# Patient Record
Sex: Male | Born: 1960
Health system: Southern US, Community
[De-identification: ages and names within clinical notes are randomized; demographics above are authoritative.]

## PROBLEM LIST (undated history)

## (undated) DIAGNOSIS — C4491 Basal cell carcinoma of skin, unspecified: Secondary | ICD-10-CM

## (undated) DIAGNOSIS — M1990 Unspecified osteoarthritis, unspecified site: Secondary | ICD-10-CM

## (undated) DIAGNOSIS — I10 Essential (primary) hypertension: Secondary | ICD-10-CM

## (undated) DIAGNOSIS — G4730 Sleep apnea, unspecified: Secondary | ICD-10-CM

## (undated) DIAGNOSIS — C801 Malignant (primary) neoplasm, unspecified: Secondary | ICD-10-CM

## (undated) DIAGNOSIS — F419 Anxiety disorder, unspecified: Secondary | ICD-10-CM

## (undated) DIAGNOSIS — J189 Pneumonia, unspecified organism: Secondary | ICD-10-CM

## (undated) DIAGNOSIS — K219 Gastro-esophageal reflux disease without esophagitis: Secondary | ICD-10-CM

## (undated) HISTORY — PX: TONSILLECTOMY: SUR1361

## (undated) HISTORY — PX: BACK SURGERY: SHX140

## (undated) HISTORY — PX: COLONOSCOPY: SHX174

## (undated) HISTORY — DX: Basal cell carcinoma of skin, unspecified: C44.91

---

## 2013-06-22 LAB — CBC AND DIFFERENTIAL
HCT: 40 % — AB (ref 41–53)
HEMOGLOBIN: 13.8 g/dL (ref 13.5–17.5)
Neutrophils Absolute: 3 /uL
Platelets: 251 10*3/uL (ref 150–399)
WBC: 6.1 10*3/mL

## 2013-06-22 LAB — BASIC METABOLIC PANEL
BUN: 13 mg/dL (ref 4–21)
CREATININE: 0.7 mg/dL (ref 0.6–1.3)
Glucose: 89 mg/dL
POTASSIUM: 4.7 mmol/L (ref 3.4–5.3)
Sodium: 141 mmol/L (ref 137–147)

## 2013-06-22 LAB — LIPID PANEL
Cholesterol: 231 mg/dL — AB (ref 0–200)
HDL: 56 mg/dL (ref 35–70)
LDL CALC: 156 mg/dL
LDl/HDL Ratio: 2.8
TRIGLYCERIDES: 95 mg/dL (ref 40–160)

## 2013-06-22 LAB — HEPATIC FUNCTION PANEL
ALK PHOS: 77 U/L (ref 25–125)
ALT: 19 U/L (ref 10–40)
AST: 16 U/L (ref 14–40)

## 2013-06-22 LAB — TSH: TSH: 0.91 u[IU]/mL (ref 0.41–5.90)

## 2013-06-22 LAB — PSA: PSA: 0.2

## 2014-10-06 ENCOUNTER — Other Ambulatory Visit: Payer: Self-pay | Admitting: Family Medicine

## 2015-01-02 DIAGNOSIS — Z6841 Body Mass Index (BMI) 40.0 and over, adult: Secondary | ICD-10-CM | POA: Insufficient documentation

## 2015-01-02 DIAGNOSIS — I10 Essential (primary) hypertension: Secondary | ICD-10-CM | POA: Insufficient documentation

## 2015-01-02 DIAGNOSIS — E559 Vitamin D deficiency, unspecified: Secondary | ICD-10-CM | POA: Insufficient documentation

## 2015-01-02 DIAGNOSIS — M1990 Unspecified osteoarthritis, unspecified site: Secondary | ICD-10-CM | POA: Insufficient documentation

## 2015-01-02 DIAGNOSIS — G4730 Sleep apnea, unspecified: Secondary | ICD-10-CM | POA: Insufficient documentation

## 2015-01-02 DIAGNOSIS — E669 Obesity, unspecified: Secondary | ICD-10-CM | POA: Insufficient documentation

## 2015-01-08 ENCOUNTER — Ambulatory Visit (INDEPENDENT_AMBULATORY_CARE_PROVIDER_SITE_OTHER): Payer: BC Managed Care – PPO | Admitting: Family Medicine

## 2015-01-08 ENCOUNTER — Encounter: Payer: Self-pay | Admitting: Family Medicine

## 2015-01-08 VITALS — BP 122/80 | HR 74 | Temp 98.6°F | Resp 16 | Ht 71.0 in | Wt 369.0 lb

## 2015-01-08 DIAGNOSIS — Z125 Encounter for screening for malignant neoplasm of prostate: Secondary | ICD-10-CM

## 2015-01-08 DIAGNOSIS — Z1211 Encounter for screening for malignant neoplasm of colon: Secondary | ICD-10-CM | POA: Diagnosis not present

## 2015-01-08 DIAGNOSIS — Z0000 Encounter for general adult medical examination without abnormal findings: Secondary | ICD-10-CM | POA: Diagnosis not present

## 2015-01-08 LAB — POCT URINALYSIS DIPSTICK
BILIRUBIN UA: NEGATIVE
GLUCOSE UA: NEGATIVE
Ketones, UA: NEGATIVE
Leukocytes, UA: NEGATIVE
Nitrite, UA: NEGATIVE
PH UA: 7
PROTEIN UA: NEGATIVE
RBC UA: NEGATIVE
SPEC GRAV UA: 1.01
Urobilinogen, UA: 0.2

## 2015-01-08 NOTE — Progress Notes (Signed)
Patient ID: Terry Horn, male   DOB: 01-10-1961, 54 y.o.   MRN: [id number]       Patient: Terry Horn, Male    DOB: 1961/01/13, 54 y.o.   MRN: [id number] Visit Date: 01/08/2015  Today's Provider: Wilhemena Durie, MD   Chief Complaint  Patient presents with  . Annual Exam   Subjective:    Annual physical exam Terry Horn is a 54 y.o. male who presents today for health maintenance and complete physical. He feels well. He reports exercising 3 times a week. Cardio and weight lifting. He reports he is sleeping well.  ----------------------------------------------------------------- EKG- 04/16/11 Pneumovax 23- 12/21/91 Tdap- 10/26/07 Colonoscopy- Never  Review of Systems  Constitutional: Negative.   HENT: Positive for congestion.   Eyes: Negative.   Respiratory: Negative.   Cardiovascular: Negative.   Gastrointestinal: Negative.   Endocrine: Negative.   Genitourinary: Negative.   Musculoskeletal: Negative.   Skin: Negative.   Allergic/Immunologic: Negative.   Neurological: Negative.   Hematological: Negative.     Social History He  reports that he has quit smoking. He does not have any smokeless tobacco history on file. He reports that he drinks alcohol. He reports that he does not use illicit drugs. Social History   Social History  . Marital Status: Married    Spouse Name: N/A  . Number of Children: N/A  . Years of Education: N/A   Social History Main Topics  . Smoking status: Former Smoker -- 22 years  . Smokeless tobacco: None     Comment: Quit in 1998  . Alcohol Use: Yes     Comment: occasionally  . Drug Use: No  . Sexual Activity: Not Asked   Other Topics Concern  . None   Social History Narrative    Patient Active Problem List   Diagnosis Date Noted  . Arthritis 01/02/2015  . Body mass index (BMI) of 50-59.9 in adult ([place]) 01/02/2015  . Essential (primary) hypertension 01/02/2015  . Adiposity 01/02/2015  . Apnea, sleep 01/02/2015    . Avitaminosis D 01/02/2015    Past Surgical History  Procedure Laterality Date  . Tonsillectomy      Family History  Family Status  Relation Status Death Age  . Mother Alive   . Father Alive   . Sister Alive   . Son Alive   . Paternal Grandfather Deceased   . Son Alive    His family history includes Bipolar disorder in his mother; Depression in his sister; Prostate cancer in his paternal grandfather and paternal uncle.    No Known Allergies  Previous Medications   ASPIRIN 81 MG TABLET    Take by mouth.   CETIRIZINE-PSEUDOEPHEDRINE (ZYRTEC-D ALLERGY & CONGESTION) 5-120 MG TABLET    Take by mouth.   CHOLECALCIFEROL 1000 UNITS TABLET    Take by mouth.   LOSARTAN-HYDROCHLOROTHIAZIDE (HYZAAR) 100-12.5 MG PER TABLET    TAKE 1 TABLET BY MOUTH DAILY   MISC NATURAL PRODUCTS (GLUCOSAMINE CHONDROITIN COMPLX) TABS    Take by mouth.   MULTIPLE VITAMIN TABLET    Take by mouth.   NABUMETONE (RELAFEN) 500 MG TABLET    Take by mouth.   OMEGA-3 FATTY ACIDS PO    Take by mouth.   TURMERIC, CURCUMA LONGA, POWD       VITAMIN C (ASCORBIC ACID) 500 MG TABLET    Take by mouth.   VITAMIN E 400 UNIT CAPSULE    Take by mouth.    Patient Care Team: Delfino Lovett  L Cranford Mon., MD as PCP - General (Family Medicine)     Objective:   Vitals: BP 122/80 mmHg  Pulse 74  Temp(Src) 98.6 F (37 C) (Oral)  Resp 16  Ht 5\' 11"  (1.803 m)  Wt 369 lb (167.377 kg)  BMI 51.49 kg/m2   Physical Exam  Constitutional: He is oriented to person, place, and time. He appears well-developed and well-nourished.  HENT:  Head: Normocephalic and atraumatic.  Right Ear: External ear normal.  Left Ear: External ear normal.  Nose: Nose normal.  Mouth/Throat: Oropharynx is clear and moist.  Eyes: Conjunctivae and EOM are normal. Pupils are equal, round, and reactive to light.  Neck: Normal range of motion. Neck supple.  Cardiovascular: Normal rate, regular rhythm, normal heart sounds and intact distal pulses.    Pulmonary/Chest: Effort normal and breath sounds normal.  Abdominal: Soft. Bowel sounds are normal.  Genitourinary: Rectum normal, prostate normal and penis normal.  Large perianal skin tag present.  Musculoskeletal: Normal range of motion. He exhibits edema.  Trace edema, mainly lymphedema.  Neurological: He is alert and oriented to person, place, and time. He has normal reflexes.  Skin: Skin is warm and dry.  Psychiatric: He has a normal mood and affect. His behavior is normal. Judgment and thought content normal.     Depression Screen PHQ 2/9 Scores 01/08/2015  PHQ - 2 Score 0      Assessment & Plan:     Routine Health Maintenance and Physical Exam  Exercise Activities and Dietary recommendations Goals    None      Immunization History  Administered Date(s) Administered  . Pneumococcal Polysaccharide-23 12/21/1991  . Tdap 10/26/2007    Health Maintenance  Topic Date Due  . Hepatitis C Screening  1960-05-25  . HIV Screening  09/14/1975  . COLONOSCOPY  09/14/2010  . INFLUENZA VACCINE  01/08/2016 (Originally 10/01/2014)  . TETANUS/TDAP  10/25/2017     Refer for screening colonoscopy Discussed health benefits of physical activity, and encouraged him to engage in regular exercise appropriate for his age and condition.   Morbid obesity Diet and exercise discussed. Seborrheic keratosis of right forearm Refer to dermatology Large Peri Anal skin tag Refer to Dr. Bary Castilla --------------------------------------------------------------------

## 2015-01-09 LAB — CBC WITH DIFFERENTIAL/PLATELET
Basophils Absolute: 0 10*3/uL (ref 0.0–0.2)
Basos: 1 %
EOS (ABSOLUTE): 0.1 10*3/uL (ref 0.0–0.4)
EOS: 2 %
HEMATOCRIT: 40.3 % (ref 37.5–51.0)
HEMOGLOBIN: 13.6 g/dL (ref 12.6–17.7)
Immature Grans (Abs): 0 10*3/uL (ref 0.0–0.1)
Immature Granulocytes: 1 %
LYMPHS ABS: 2.2 10*3/uL (ref 0.7–3.1)
Lymphs: 35 %
MCH: 27.8 pg (ref 26.6–33.0)
MCHC: 33.7 g/dL (ref 31.5–35.7)
MCV: 82 fL (ref 79–97)
MONOCYTES: 8 %
Monocytes Absolute: 0.5 10*3/uL (ref 0.1–0.9)
NEUTROS ABS: 3.4 10*3/uL (ref 1.4–7.0)
Neutrophils: 53 %
Platelets: 290 10*3/uL (ref 150–379)
RBC: 4.9 x10E6/uL (ref 4.14–5.80)
RDW: 13.6 % (ref 12.3–15.4)
WBC: 6.2 10*3/uL (ref 3.4–10.8)

## 2015-01-09 LAB — COMPREHENSIVE METABOLIC PANEL
ALBUMIN: 4.7 g/dL (ref 3.5–5.5)
ALK PHOS: 81 IU/L (ref 39–117)
ALT: 23 IU/L (ref 0–44)
AST: 24 IU/L (ref 0–40)
Albumin/Globulin Ratio: 2 (ref 1.1–2.5)
BILIRUBIN TOTAL: 0.4 mg/dL (ref 0.0–1.2)
BUN / CREAT RATIO: 18 (ref 9–20)
BUN: 12 mg/dL (ref 6–24)
CHLORIDE: 96 mmol/L — AB (ref 97–106)
CO2: 26 mmol/L (ref 18–29)
Calcium: 9.5 mg/dL (ref 8.7–10.2)
Creatinine, Ser: 0.67 mg/dL — ABNORMAL LOW (ref 0.76–1.27)
GFR calc Af Amer: 126 mL/min/{1.73_m2} (ref >59)
GFR calc non Af Amer: 109 mL/min/{1.73_m2} (ref >59)
GLUCOSE: 95 mg/dL (ref 65–99)
Globulin, Total: 2.3 g/dL (ref 1.5–4.5)
Potassium: 4.1 mmol/L (ref 3.5–5.2)
Sodium: 139 mmol/L (ref 136–144)
Total Protein: 7 g/dL (ref 6.0–8.5)

## 2015-01-09 LAB — LIPID PANEL WITH LDL/HDL RATIO
Cholesterol, Total: 218 mg/dL — ABNORMAL HIGH (ref 100–199)
HDL: 51 mg/dL (ref >39)
LDL CALC: 141 mg/dL — AB (ref 0–99)
LDL/HDL RATIO: 2.8 ratio (ref 0.0–3.6)
Triglycerides: 128 mg/dL (ref 0–149)
VLDL Cholesterol Cal: 26 mg/dL (ref 5–40)

## 2015-01-09 LAB — PSA: PROSTATE SPECIFIC AG, SERUM: 0.2 ng/mL (ref 0.0–4.0)

## 2015-01-09 LAB — TSH: TSH: 0.88 u[IU]/mL (ref 0.450–4.500)

## 2015-01-10 ENCOUNTER — Telehealth: Payer: Self-pay

## 2015-01-10 NOTE — Telephone Encounter (Signed)
Left message to call back  

## 2015-01-10 NOTE — Telephone Encounter (Signed)
-----   Message from Jerrol Banana., MD sent at 01/09/2015  2:14 PM EST ----- Labs in normal range.

## 2015-01-17 NOTE — Telephone Encounter (Signed)
Pt advised-aa 

## 2015-04-22 ENCOUNTER — Telehealth: Payer: Self-pay | Admitting: Family Medicine

## 2015-04-22 MED ORDER — OSELTAMIVIR PHOSPHATE 75 MG PO CAPS: 75.0000 mg | ORAL_CAPSULE | Freq: Every day | ORAL | Status: DC

## 2015-04-22 NOTE — Telephone Encounter (Signed)
Wife with flu. Will send in prophylaxis.

## 2015-05-01 ENCOUNTER — Telehealth: Payer: Self-pay | Admitting: Family Medicine

## 2015-05-01 DIAGNOSIS — R0683 Snoring: Secondary | ICD-10-CM

## 2015-05-01 DIAGNOSIS — R0681 Apnea, not elsewhere classified: Secondary | ICD-10-CM

## 2015-05-01 NOTE — Telephone Encounter (Signed)
Pt is requesting that a sleep study be set up at [facility].He states that he has spoken with you concerning this outside of the office

## 2015-05-01 NOTE — Telephone Encounter (Signed)
Order put in-aa 

## 2015-05-01 NOTE — Telephone Encounter (Signed)
Please review-aa 

## 2015-05-01 NOTE — Telephone Encounter (Signed)
Overnight sleep study for probable sleep apnea.patient snores and has witnessed apnea.

## 2015-05-31 ENCOUNTER — Ambulatory Visit: Payer: BC Managed Care – PPO | Attending: Otolaryngology

## 2015-05-31 DIAGNOSIS — R0683 Snoring: Secondary | ICD-10-CM | POA: Diagnosis present

## 2015-05-31 DIAGNOSIS — G4733 Obstructive sleep apnea (adult) (pediatric): Secondary | ICD-10-CM | POA: Diagnosis not present

## 2015-06-25 ENCOUNTER — Telehealth: Payer: Self-pay | Admitting: Family Medicine

## 2015-06-25 DIAGNOSIS — G4730 Sleep apnea, unspecified: Secondary | ICD-10-CM

## 2015-06-25 NOTE — Telephone Encounter (Signed)
Patient has severe sleep apnea. Start CPAP at 13 cm water nightly and follow-up here in office 1 month later. I don't know anything about an MRI.

## 2015-06-25 NOTE — Telephone Encounter (Signed)
Please review sleep study results and i do not see that we received an email from [place]

## 2015-06-25 NOTE — Telephone Encounter (Signed)
Pt said he had a sleep study 3/31 and has not heard anything back.  Also he said he emailed [place] regarding sedation MRI  Back and hasn't heard back.    Please advise  [phone]  Thanks Con Memos

## 2015-06-26 NOTE — Telephone Encounter (Signed)
LMTCB

## 2015-06-26 NOTE — Telephone Encounter (Signed)
Yes

## 2015-06-26 NOTE — Telephone Encounter (Signed)
Order for CPAP faxed to [facility]

## 2015-06-26 NOTE — Telephone Encounter (Signed)
Advised patient as below.  

## 2015-06-26 NOTE — Telephone Encounter (Signed)
Patient advised as directed below. CPAP ordered. CB# [phone]  Patient states he had sent a personal e mail to Dr. Rosanna Randy regarding a MRI. Patient states Dr. Drema Dallas is recommending a MRI on back, and want to know if Dr. Rosanna Randy thinks patient is stable to be put to sleep for the MRI. If Dr. Rosanna Randy thinks patient is stable enough Dr. Drema Dallas will order MRI. Patient's CB # [phone].

## 2015-06-26 NOTE — Telephone Encounter (Signed)
Left message to call back  

## 2015-06-28 ENCOUNTER — Other Ambulatory Visit (HOSPITAL_COMMUNITY): Payer: Self-pay | Admitting: Sports Medicine

## 2015-07-02 ENCOUNTER — Other Ambulatory Visit: Payer: Self-pay | Admitting: Family Medicine

## 2015-07-26 ENCOUNTER — Ambulatory Visit (INDEPENDENT_AMBULATORY_CARE_PROVIDER_SITE_OTHER): Payer: BC Managed Care – PPO | Admitting: Family Medicine

## 2015-07-26 ENCOUNTER — Encounter: Payer: Self-pay | Admitting: Family Medicine

## 2015-07-26 VITALS — BP 144/86 | HR 76 | Temp 98.2°F | Resp 16 | Wt 373.0 lb

## 2015-07-26 DIAGNOSIS — R202 Paresthesia of skin: Secondary | ICD-10-CM | POA: Insufficient documentation

## 2015-07-26 DIAGNOSIS — G4730 Sleep apnea, unspecified: Secondary | ICD-10-CM | POA: Diagnosis not present

## 2015-07-26 DIAGNOSIS — R0789 Other chest pain: Secondary | ICD-10-CM | POA: Insufficient documentation

## 2015-07-26 DIAGNOSIS — Z6841 Body Mass Index (BMI) 40.0 and over, adult: Secondary | ICD-10-CM

## 2015-07-26 DIAGNOSIS — E669 Obesity, unspecified: Secondary | ICD-10-CM | POA: Diagnosis not present

## 2015-07-26 LAB — POCT GLYCOSYLATED HEMOGLOBIN (HGB A1C)
ESTIMATED AVERAGE GLUCOSE: 123
HEMOGLOBIN A1C: 5.9

## 2015-07-26 NOTE — Progress Notes (Signed)
Subjective:    Patient ID: Terry Horn, male    DOB: [birth year], 55 y.o.   MRN: [id number]  HPI  Sleep Apnea: He presents for a sleep evaluation. He complains of decreased memory, excessive daytime sleepiness, congested nose.  Symptoms began a few years ago, gradually worsening since that time. Collar size 18. Previous evaluation and treatment has included sleep study on 06/10/2015, which showed obstructive sleep apnea, pt currently on CPAP that uses 13 cm H2O, heated and humidified. Pt has been using the CPAP since the beginning of May. Pt is concerned because he is still experiencing excessive fatigue (was fearful of falling asleep while driving 3 hours to [place]), a chest pressure (pt believes this to be from the CPAP, but is still concerned of cardiac involvement). Pt would like to discuss weight loss as well as the seriousness of sleep apnea. Pt states he is "anxious" about having to use the CPAP machine.   Review of Systems  Constitutional: Positive for fever, chills, diaphoresis and fatigue. Negative for activity change, appetite change and unexpected weight change.  HENT: Positive for congestion.   Respiratory: Positive for apnea and cough. Negative for shortness of breath.   Cardiovascular: Positive for chest pain ("bloating"). Negative for palpitations and leg swelling.  Endocrine: Negative for cold intolerance, heat intolerance, polydipsia, polyphagia and polyuria.  Neurological:       Paresthesias are present  Psychiatric/Behavioral: The patient is nervous/anxious.    BP 144/86 mmHg  Pulse 76  Temp(Src) 98.2 F (36.8 C) (Oral)  Resp 16  Wt 373 lb (169.192 kg)   Patient Active Problem List   Diagnosis Date Noted  . Arthritis 01/02/2015  . Body mass index (BMI) of 50-59.9 in adult ([place]) 01/02/2015  . Essential (primary) hypertension 01/02/2015  . Adiposity 01/02/2015  . Apnea, sleep 01/02/2015  . Avitaminosis D 01/02/2015   No past medical history on  file. Current Outpatient Prescriptions on File Prior to Visit  Medication Sig  . aspirin 81 MG tablet Take by mouth.  . cetirizine-pseudoephedrine (ZYRTEC-D ALLERGY & CONGESTION) 5-120 MG tablet Take by mouth.  . Cholecalciferol 1000 UNITS tablet Take by mouth.  . losartan-hydrochlorothiazide (HYZAAR) 100-12.5 MG per tablet TAKE 1 TABLET BY MOUTH DAILY  . Misc Natural Products (GLUCOSAMINE CHONDROITIN COMPLX) TABS Take by mouth.  . Multiple Vitamin tablet Take by mouth.  . nabumetone (RELAFEN) 500 MG tablet TAKE 1 TABLET TWICE DAILY AS NEEDED FOR PAIN  . OMEGA-3 FATTY ACIDS PO Take by mouth.  . Turmeric, Curcuma [place], POWD   . vitamin C (ASCORBIC ACID) 500 MG tablet Take by mouth.  . vitamin E 400 UNIT capsule Take by mouth.   No current facility-administered medications on file prior to visit.   No Known Allergies Past Surgical History  Procedure Laterality Date  . Tonsillectomy     Social History   Social History  . Marital Status: Married    Spouse Name: N/A  . Number of Children: N/A  . Years of Education: N/A   Occupational History  . Not on file.   Social History Main Topics  . Smoking status: Former Smoker -- 22 years  . Smokeless tobacco: Not on file     Comment: Quit in 1998  . Alcohol Use: Yes     Comment: occasionally  . Drug Use: No  . Sexual Activity: Not on file   Other Topics Concern  . Not on file   Social History Narrative   Family History  Problem  Relation Age of Onset  . Bipolar disorder Mother   . Depression Sister   . Prostate cancer Paternal Uncle   . Prostate cancer Paternal Grandfather       Objective:   Physical Exam  Constitutional: He is oriented to person, place, and time. He appears well-developed and well-nourished.  Cardiovascular: Normal rate and regular rhythm.   Pulmonary/Chest: Effort normal and breath sounds normal.  Neurological: He is alert and oriented to person, place, and time.  Psychiatric: He has a normal mood  and affect. His behavior is normal. Judgment and thought content normal.  BP 144/86 mmHg  Pulse 76  Temp(Src) 98.2 F (36.8 C) (Oral)  Resp 16  Wt 373 lb (169.192 kg)      Assessment & Plan:  1. Paresthesia Concerned about diabetes, but blood sugar is only slightly elevated at this time.   - POCT glycosylated hemoglobin (Hb A1C) Results for orders placed or performed in visit on 07/26/15  POCT glycosylated hemoglobin (Hb A1C)  Result Value Ref Range   Hemoglobin A1C 5.9    Est. average glucose Bld gHb Est-mCnc 123     2. Chest pressure EKG unchanged. Suspect had low blood sugar and anxiety but will refer to make sure not cardiac component. Also, ER if symptoms recur.  - EKG 12-Lead - Ambulatory referral to Cardiology  3. Apnea, sleep Will recheck CPAP as does not feel as well as he did after test.    - Cpap titration; Future  4. Adiposity Morbidly obese.   Recommend aggressive lifestyle changes and weight loss. May need to consider surgery.  - Amb Referral to Nutrition and Diabetic Education  5. Body mass index (BMI) of 50-59.9 in adult [facility])   Patient seen and examined by Jerrell Belfast, MD, and note scribed by Renaldo Fiddler, CMA.

## 2015-08-12 ENCOUNTER — Encounter: Payer: Self-pay | Admitting: *Deleted

## 2015-08-19 ENCOUNTER — Other Ambulatory Visit (HOSPITAL_COMMUNITY): Payer: Self-pay | Admitting: Sports Medicine

## 2015-08-19 DIAGNOSIS — S39012D Strain of muscle, fascia and tendon of lower back, subsequent encounter: Secondary | ICD-10-CM

## 2015-08-19 DIAGNOSIS — M5030 Other cervical disc degeneration, unspecified cervical region: Secondary | ICD-10-CM

## 2015-08-22 ENCOUNTER — Encounter: Payer: Self-pay | Admitting: Family Medicine

## 2015-08-22 ENCOUNTER — Ambulatory Visit (INDEPENDENT_AMBULATORY_CARE_PROVIDER_SITE_OTHER): Payer: BC Managed Care – PPO | Admitting: Family Medicine

## 2015-08-22 VITALS — BP 126/68 | HR 78 | Temp 98.0°F | Resp 16 | Wt 367.0 lb

## 2015-08-22 DIAGNOSIS — Z6841 Body Mass Index (BMI) 40.0 and over, adult: Secondary | ICD-10-CM | POA: Diagnosis not present

## 2015-08-22 DIAGNOSIS — E6609 Other obesity due to excess calories: Secondary | ICD-10-CM

## 2015-08-22 DIAGNOSIS — F419 Anxiety disorder, unspecified: Secondary | ICD-10-CM | POA: Diagnosis not present

## 2015-08-22 DIAGNOSIS — R202 Paresthesia of skin: Secondary | ICD-10-CM

## 2015-08-22 DIAGNOSIS — G4730 Sleep apnea, unspecified: Secondary | ICD-10-CM | POA: Diagnosis not present

## 2015-08-22 MED ORDER — ALPRAZOLAM 0.5 MG PO TABS: 0.5000 mg | ORAL_TABLET | Freq: Once | ORAL | Status: DC

## 2015-08-22 NOTE — Progress Notes (Signed)
Patient ID: Terry Horn, male   DOB: 09-18-1960, 55 y.o.   MRN: [id number]    Subjective:  HPI Pt is here to discuss his MRI for his back by [facility]. He needs clearance for sedation for this study. Pt is also wanting discussing bariatric surgery and his weight. Also sleep apnea concerns.   Prior to Admission medications   Medication Sig Start Date End Date Taking? Authorizing Provider  aspirin 81 MG tablet Take by mouth. 04/08/10  Yes Historical Provider, MD  cetirizine-pseudoephedrine (ZYRTEC-D ALLERGY & CONGESTION) 5-120 MG tablet Take by mouth. 10/07/10  Yes Historical Provider, MD  Cholecalciferol 1000 UNITS tablet Take by mouth. 04/08/10  Yes Historical Provider, MD  losartan-hydrochlorothiazide (HYZAAR) 100-12.5 MG per tablet TAKE 1 TABLET BY MOUTH DAILY 10/08/14  Yes Jerrol Banana., MD  Misc Natural Products (GLUCOSAMINE CHONDROITIN COMPLX) TABS Take by mouth.   Yes Historical Provider, MD  Multiple Vitamin tablet Take by mouth. 04/08/10  Yes Historical Provider, MD  nabumetone (RELAFEN) 500 MG tablet TAKE 1 TABLET TWICE DAILY AS NEEDED FOR PAIN 07/02/15  Yes Sydni Elizarraraz Maceo Pro., MD  OMEGA-3 FATTY ACIDS PO Take by mouth. 04/08/10  Yes Historical Provider, MD  Turmeric, Lear Ng, POWD    Yes Historical Provider, MD  vitamin C (ASCORBIC ACID) 500 MG tablet Take by mouth. 04/08/10  Yes Historical Provider, MD  vitamin E 400 UNIT capsule Take by mouth. 04/08/10  Yes Historical Provider, MD    Patient Active Problem List   Diagnosis Date Noted  . Chest pressure 07/26/2015  . Paresthesia 07/26/2015  . Arthritis 01/02/2015  . Body mass index (BMI) of 50-59.9 in adult ([place]) 01/02/2015  . Essential (primary) hypertension 01/02/2015  . Adiposity 01/02/2015  . Apnea, sleep 01/02/2015  . Avitaminosis D 01/02/2015    History reviewed. No pertinent past medical history.  Social History   Social History  . Marital Status: Married    Spouse Name: N/A  . Number of Children:  N/A  . Years of Education: N/A   Occupational History  . Not on file.   Social History Main Topics  . Smoking status: Former Smoker -- 22 years  . Smokeless tobacco: Not on file     Comment: Quit in 1998  . Alcohol Use: Yes     Comment: occasionally  . Drug Use: No  . Sexual Activity: Not on file   Other Topics Concern  . Not on file   Social History Narrative    No Known Allergies  Review of Systems  Constitutional: Negative.   HENT: Negative.   Eyes: Negative.   Cardiovascular: Negative.   Gastrointestinal: Negative.   Genitourinary: Negative.   Musculoskeletal: Positive for back pain.  Skin: Negative.   Neurological: Negative.   Endo/Heme/Allergies: Negative.   Psychiatric/Behavioral: The patient is nervous/anxious.     Immunization History  Administered Date(s) Administered  . Pneumococcal Polysaccharide-23 12/21/1991  . Tdap 10/26/2007   Objective:  BP 126/68 mmHg  Pulse 78  Temp(Src) 98 F (36.7 C) (Oral)  Resp 16  Wt 367 lb (166.47 kg)  SpO2 96%  Physical Exam  Constitutional: He is oriented to person, place, and time and well-developed, well-nourished, and in no distress.  HENT:  Head: Normocephalic and atraumatic.  Right Ear: External ear normal.  Left Ear: External ear normal.  Nose: Nose normal.  Mouth/Throat: Oropharynx is clear and moist.  Eyes: Conjunctivae and EOM are normal. Pupils are equal, round, and reactive to light.  Neck:  Normal range of motion. Neck supple.  Cardiovascular: Normal rate, regular rhythm, normal heart sounds and intact distal pulses.   Pulmonary/Chest: Effort normal and breath sounds normal.  Abdominal: Soft. Bowel sounds are normal.  Musculoskeletal: Normal range of motion.  Lymphedema bilaterally  Neurological: He is alert and oriented to person, place, and time. He has normal reflexes. Gait normal. GCS score is 15.  Skin: Skin is warm and dry.  Psychiatric: Mood, memory, affect and judgment normal.    Lab  Results  Component Value Date   WBC 6.2 01/08/2015   HGB 13.8 06/22/2013   HCT 40.3 01/08/2015   PLT 290 01/08/2015   GLUCOSE 95 01/08/2015   CHOL 218* 01/08/2015   TRIG 128 01/08/2015   HDL 51 01/08/2015   LDLCALC 141* 01/08/2015   TSH 0.880 01/08/2015   PSA 0.2 06/22/2013   HGBA1C 5.9 07/26/2015    CMP     Component Value Date/Time   NA 139 01/08/2015 0944   K 4.1 01/08/2015 0944   CL 96* 01/08/2015 0944   CO2 26 01/08/2015 0944   GLUCOSE 95 01/08/2015 0944   BUN 12 01/08/2015 0944   CREATININE 0.67* 01/08/2015 0944   CREATININE 0.7 06/22/2013   CALCIUM 9.5 01/08/2015 0944   PROT 7.0 01/08/2015 0944   ALBUMIN 4.7 01/08/2015 0944   AST 24 01/08/2015 0944   ALT 23 01/08/2015 0944   ALKPHOS 81 01/08/2015 0944   BILITOT 0.4 01/08/2015 0944   GFRNONAA 109 01/08/2015 0944   GFRAA 126 01/08/2015 0944    Assessment and Plan :  1. Body mass index (BMI) of 50-59.9 in adult [facility]) Discussed diet and exercise and options for bariatric surgery. Pt wants to try diet and exercise on his own first. He has lost 6 pounds since last OV a month ago.  Follow up in 2 months. He is certainly better off if he can lose weight on his own without the bariatric surgery. Initial goal will be to get below 300 pounds. We discussed regular exercise but even more importantly the need to change eating habits. 2. Sleep apnea Pt has been using Cpap nightly.   3. Paresthesia Pt scheduled for MRI with [facility]. Given Xanax for prior to procedure.   4. Acute anxiety Follow up in 2 months 5. Cardiac risk factors After review of his chart of think it is prudent to see cardiology just for risk assessment. They may  choose to do stress test or some other imaging and I think it would be appropriate if clinically indicated. More than 50% of this visit is spent in counseling regarding these multiple issues. I'll see him back in a couple months. Patient was seen and examined by Dr. Miguel Aschoff, and noted scribed by Webb Laws, [place] MD [facility] 08/22/2015 3:12 PM

## 2015-09-05 ENCOUNTER — Other Ambulatory Visit (HOSPITAL_COMMUNITY): Payer: Self-pay | Admitting: *Deleted

## 2015-09-05 NOTE — Pre-Procedure Instructions (Signed)
    Terry Horn  09/05/2015      Your procedure is scheduled on Tuesday, September 10, 2015 at 8:15 AM.   Report to [facility] Entrance "A" Admitting Office at 6:15 AM.   Call this number if you have problems the morning of surgery: [phone]   Any questions prior to day of surgery, please call [phone] between 8 & 4 PM.   Remember:  Do not eat food or drink liquids after midnight Monday, 09/09/15.  Stop Zyrtec D, Herbal Medications, Fish Oil Multivitamins and Vitamin E as of today.     Do not wear jewelry.  Do not wear lotions, powders, or cologne.  You may wear deoderant.  Men may shave face and neck.  Do not bring valuables to the hospital.  [facility] is not responsible for any belongings or valuables.  Contacts, dentures or bridgework may not be worn into surgery.  Leave your suitcase in the car.  After surgery it may be brought to your room.  For patients admitted to the hospital, discharge time will be determined by your treatment team.  Patients discharged the day of surgery will not be allowed to drive home.

## 2015-09-06 ENCOUNTER — Other Ambulatory Visit: Payer: Self-pay | Admitting: Emergency Medicine

## 2015-09-06 ENCOUNTER — Encounter (HOSPITAL_COMMUNITY): Payer: Self-pay

## 2015-09-06 ENCOUNTER — Encounter (HOSPITAL_COMMUNITY)
Admission: RE | Admit: 2015-09-06 | Discharge: 2015-09-06 | Disposition: A | Discharge status: Home / Self Care | Payer: BC Managed Care – PPO | Source: Ambulatory Visit | Attending: Family Medicine | Admitting: Family Medicine

## 2015-09-06 ENCOUNTER — Telehealth: Payer: Self-pay | Admitting: Emergency Medicine

## 2015-09-06 DIAGNOSIS — Z01818 Encounter for other preprocedural examination: Secondary | ICD-10-CM

## 2015-09-06 DIAGNOSIS — G4733 Obstructive sleep apnea (adult) (pediatric): Secondary | ICD-10-CM | POA: Insufficient documentation

## 2015-09-06 DIAGNOSIS — R200 Anesthesia of skin: Secondary | ICD-10-CM | POA: Insufficient documentation

## 2015-09-06 DIAGNOSIS — I10 Essential (primary) hypertension: Secondary | ICD-10-CM | POA: Insufficient documentation

## 2015-09-06 DIAGNOSIS — Z01812 Encounter for preprocedural laboratory examination: Secondary | ICD-10-CM | POA: Insufficient documentation

## 2015-09-06 DIAGNOSIS — Z6841 Body Mass Index (BMI) 40.0 and over, adult: Secondary | ICD-10-CM | POA: Insufficient documentation

## 2015-09-06 HISTORY — DX: Unspecified osteoarthritis, unspecified site: M19.90

## 2015-09-06 HISTORY — DX: Pneumonia, unspecified organism: J18.9

## 2015-09-06 HISTORY — DX: Malignant (primary) neoplasm, unspecified: C80.1

## 2015-09-06 HISTORY — DX: Essential (primary) hypertension: I10

## 2015-09-06 HISTORY — DX: Anxiety disorder, unspecified: F41.9

## 2015-09-06 HISTORY — DX: Sleep apnea, unspecified: G47.30

## 2015-09-06 HISTORY — DX: Gastro-esophageal reflux disease without esophagitis: K21.9

## 2015-09-06 LAB — BASIC METABOLIC PANEL
Anion gap: 7 (ref 5–15)
BUN: 11 mg/dL (ref 6–20)
CALCIUM: 9.5 mg/dL (ref 8.9–10.3)
CO2: 29 mmol/L (ref 22–32)
CREATININE: 0.72 mg/dL (ref 0.61–1.24)
Chloride: 102 mmol/L (ref 101–111)
GFR calc Af Amer: >60 mL/min (ref >60)
Glucose, Bld: 104 mg/dL — ABNORMAL HIGH (ref 65–99)
Potassium: 4.5 mmol/L (ref 3.5–5.1)
SODIUM: 138 mmol/L (ref 135–145)

## 2015-09-06 NOTE — Progress Notes (Signed)
Pt denies cardiac history. Does state that he has a lot of "bloating" and "gas pressure" in his chest with the use of his CPAP. Denies any chest or arm pain. States he gets up in the AM and sometimes drinks a diet coke to help him belch and then he feels better. He saw Dr. Rosanna Randy on 08/22/15 for clearance and there is note from Dr. Rosanna Randy stating a need for cardiology to see pt. Pt states he was not told this at the time of the appt and has "missed" some calls from a cardiology office. He states he texted Dr. Rosanna Randy and asked him about it and said that he still is not sure whether Dr. Rosanna Randy meant for him to have it prior to the MRI under sedation. I called Dr. Alben Spittle office and spoke with Chrys Racer. She states [place], one of the nurses, will contact the pt today and see about what happened to the order for the consult.

## 2015-09-06 NOTE — Telephone Encounter (Signed)
I spoke with pt. He had spoke with Dr. Rosanna Randy about this. Procedure will be postponed but will still need to get in ASAP. He has seen Dr. Nehemiah Massed in the past and would like to see him again. Thanks.

## 2015-09-09 ENCOUNTER — Other Ambulatory Visit: Payer: Self-pay | Admitting: Internal Medicine

## 2015-09-09 DIAGNOSIS — R0609 Other forms of dyspnea: Principal | ICD-10-CM

## 2015-09-10 ENCOUNTER — Encounter (HOSPITAL_COMMUNITY): Admission: RE | Payer: Self-pay | Source: Ambulatory Visit

## 2015-09-10 ENCOUNTER — Ambulatory Visit (HOSPITAL_COMMUNITY): Admission: RE | Admit: 2015-09-10 | Payer: BC Managed Care – PPO | Source: Ambulatory Visit

## 2015-09-10 ENCOUNTER — Ambulatory Visit (HOSPITAL_COMMUNITY): Payer: BC Managed Care – PPO

## 2015-09-10 SURGERY — RADIOLOGY WITH ANESTHESIA
Anesthesia: General

## 2015-09-12 ENCOUNTER — Ambulatory Visit: Payer: BC Managed Care – PPO

## 2015-09-13 ENCOUNTER — Ambulatory Visit: Payer: BC Managed Care – PPO

## 2015-09-23 ENCOUNTER — Encounter: Payer: BC Managed Care – PPO | Attending: Family Medicine | Admitting: Dietician

## 2015-09-23 ENCOUNTER — Encounter: Payer: Self-pay | Admitting: Dietician

## 2015-09-23 ENCOUNTER — Encounter
Admission: RE | Admit: 2015-09-23 | Discharge: 2015-09-23 | Disposition: A | Discharge status: Home / Self Care | Payer: BC Managed Care – PPO | Source: Ambulatory Visit | Attending: Internal Medicine | Admitting: Internal Medicine

## 2015-09-23 DIAGNOSIS — R0609 Other forms of dyspnea: Secondary | ICD-10-CM | POA: Insufficient documentation

## 2015-09-23 NOTE — Progress Notes (Signed)
Medical Nutrition Therapy: Visit start time: 1700  end time: 1830  Assessment:  Diagnosis: pre-diabetes, obesity Past medical history: HTN, sleep apnea Psychosocial issues/ stress concerns: patient reports high stress level, feels he is dealing "OK" with stress Preferred learning method:  . Auditory . Visual . Hands-on  Current weight: 354.8lbs  Height: 5'11" Medications, supplements: reviewed list in chart with patient  Progress and evaluation: Patient reports health scare about 2 months ago, when he had symptoms of (unknown to him) hypoglycemia while driving; had another episode at work, and was subsequently diagnosed with pre-diabetes. He has since made significant diet changes to lose weight and prevent diabetes while avoiding further hypoglycemic episodes.   Physical activity: stationary bike 30-45 minutes, 3 times a week  Dietary Intake:  Usual eating pattern includes 3 meals and 0-1 snacks per day. Dining out frequency: 1-2 meals per week.  Breakfast: [place] sausage links (2), [place]os greek yogurt, or 2 boiled eggs and 1 slice toast.  Snack: none Lunch: leftovers or sandwich on whole wheat with fruit, nuts, salad with grilled chicken, chicken fajita, stir fry with brown rice; shrimp and veg on pita (1/2c); tuna salad Snack: occasionally nuts if hungry Supper: chicken, vegetables, small portion starch.  Snack: none recently; rarely fruit (berries) Beverages: water, some flavored water, 1-2c. Coffee, 2 diet cokes daily (more if no coffee)  Nutrition Care Education: Topics covered: weight management, diabetes prevention Basic nutrition: basic food groups, appropriate nutrient balance, appropriate meal and snack schedule    Weight control: benefits of weight control, determining reasonable weight goal, behavioral changes for weight loss: guidance on 1900kcal meal plan with 40% CHO, 30% protein, 30% fat per patient and wife's request; importance of low fat and low sugar foods; portion  control Diabetes prevention:  appropriate meal and snack schedule, appropriate carb choices, intake and balance Other lifestyle changes:  Role of physical exercise on diabetes prevention and weight control.  Nutritional Diagnosis:  [place]-3.3 Overweight/obesity As related to history of excess calories.  As evidenced by patient report.  Intervention: Instruction as noted above.   Patient is generally eating balanced meals, seems to be following low-carb eating pattern.    No additional goals for change at this time; he will use instruction and resources provided to continue a healthy eating pattern.   No follow-up scheduled; will contact patient in several weeks to check on progress.   Education Materials given:  . Food lists/ Planning A Balanced Meal . Sample meal pattern/ menus . Snacking handout . Goals/ instructions  Learner/ who was taught:  . Patient  . Spouse/ partner  Level of understanding: Marland Kitchen Verbalizes/ demonstrates competency  Demonstrated degree of understanding via:   Teach back Learning barriers: . None  Willingness to learn/ readiness for change: . Eager, change in progress  Monitoring and Evaluation:  Dietary intake, exercise, and body weight      follow up: prn

## 2015-09-23 NOTE — Patient Instructions (Signed)
   Continue with your healthy food choices and nutrient balance.   Keep up regular exercise.   Allow for occasional treats to promote long-term success.

## 2015-09-24 ENCOUNTER — Encounter
Admission: RE | Admit: 2015-09-24 | Discharge: 2015-09-24 | Disposition: A | Discharge status: Home / Self Care | Payer: BC Managed Care – PPO | Source: Ambulatory Visit | Attending: Internal Medicine | Admitting: Internal Medicine

## 2015-09-24 ENCOUNTER — Ambulatory Visit
Admission: RE | Admit: 2015-09-24 | Discharge: 2015-09-24 | Disposition: A | Discharge status: Home / Self Care | Payer: BC Managed Care – PPO | Source: Ambulatory Visit | Attending: Internal Medicine | Admitting: Internal Medicine

## 2015-09-24 DIAGNOSIS — R0609 Other forms of dyspnea: Secondary | ICD-10-CM | POA: Diagnosis not present

## 2015-09-24 DIAGNOSIS — I517 Cardiomegaly: Secondary | ICD-10-CM | POA: Diagnosis not present

## 2015-09-24 MED ORDER — TECHNETIUM TC 99M TETROFOSMIN IV KIT
29.9500 | PACK | Freq: Once | INTRAVENOUS | Status: AC | PRN
  Administered 2015-09-24: 29.95 via INTRAVENOUS

## 2015-09-24 MED ORDER — REGADENOSON 0.4 MG/5ML IV SOLN
0.4000 mg | Freq: Once | INTRAVENOUS | Status: AC
  Administered 2015-09-24: 0.4 mg via INTRAVENOUS

## 2015-09-24 NOTE — Progress Notes (Signed)
*  PRELIMINARY RESULTS* Echocardiogram 2D Echocardiogram has been performed.  Terry Horn 09/24/2015, 8:17 AM

## 2015-09-25 ENCOUNTER — Encounter
Admission: RE | Admit: 2015-09-25 | Discharge: 2015-09-25 | Disposition: A | Discharge status: Home / Self Care | Payer: BC Managed Care – PPO | Source: Ambulatory Visit | Attending: Internal Medicine | Admitting: Internal Medicine

## 2015-09-25 DIAGNOSIS — R0609 Other forms of dyspnea: Secondary | ICD-10-CM | POA: Diagnosis not present

## 2015-09-25 LAB — NM MYOCAR MULTI W/SPECT W/WALL MOTION / EF
CHL CUP MPHR: 52 {beats}/min
CHL CUP NUCLEAR SSS: 0
CSEPPHR: 86 {beats}/min
Estimated workload: 1 METS
Exercise duration (min): 1 min
Exercise duration (sec): 1 s
LV sys vol: 52 mL
LVDIAVOL: 148 mL (ref 62–150)
Rest HR: 71 {beats}/min
SDS: 0
SRS: 1
TID: 1.06

## 2015-09-25 MED ORDER — TECHNETIUM TC 99M TETROFOSMIN IV KIT
32.6200 | PACK | Freq: Once | INTRAVENOUS | Status: AC | PRN
  Administered 2015-09-25: 32.62 via INTRAVENOUS

## 2015-10-01 DIAGNOSIS — E782 Mixed hyperlipidemia: Secondary | ICD-10-CM | POA: Insufficient documentation

## 2015-10-23 ENCOUNTER — Ambulatory Visit (INDEPENDENT_AMBULATORY_CARE_PROVIDER_SITE_OTHER): Payer: BC Managed Care – PPO | Admitting: Family Medicine

## 2015-10-23 VITALS — BP 112/62 | HR 72 | Resp 14 | Wt 352.0 lb

## 2015-10-23 DIAGNOSIS — Z6841 Body Mass Index (BMI) 40.0 and over, adult: Secondary | ICD-10-CM

## 2015-10-23 DIAGNOSIS — E6609 Other obesity due to excess calories: Secondary | ICD-10-CM | POA: Diagnosis not present

## 2015-10-23 DIAGNOSIS — M5489 Other dorsalgia: Secondary | ICD-10-CM

## 2015-10-23 DIAGNOSIS — G4730 Sleep apnea, unspecified: Secondary | ICD-10-CM | POA: Diagnosis not present

## 2015-10-23 DIAGNOSIS — I10 Essential (primary) hypertension: Secondary | ICD-10-CM

## 2015-10-23 NOTE — Progress Notes (Signed)
Subjective:  HPI  Patient is here for follow up for weight. He has been working on eating healthier and continuing to go the gym for cardio and weight lifting 3 times a week. He has lost 15lbs since last visit with [place] in June. He has been wearing the CPAP at 13 cm of water but feels bloated throughout its entire chest and abdomen as night goes  on. Steroid therapy feels that he may be swallowing a lot of air and I'm inclined to agree with this. Try to do auto CPAP at 6-13 cm of water Wt Readings from Last 3 Encounters:  10/23/15 (!) 352 lb (159.7 kg)  09/23/15 (!) 354 lb 12.8 oz (160.9 kg)  09/06/15 (!) 357 lb 12.8 oz (162.3 kg)    Prior to Admission medications   Medication Sig Start Date End Date Taking? Authorizing Provider  aspirin 81 MG tablet Take 81 mg by mouth daily.  04/08/10  Yes Historical Provider, MD  cetirizine-pseudoephedrine (ZYRTEC-D ALLERGY & CONGESTION) 5-120 MG tablet Take 1 tablet by mouth 2 (two) times daily.  10/07/10  Yes Historical Provider, MD  Cholecalciferol 1000 UNITS tablet Take 1,000 Units by mouth daily.  04/08/10  Yes Historical Provider, MD  losartan-hydrochlorothiazide (HYZAAR) 100-12.5 MG per tablet TAKE 1 TABLET BY MOUTH DAILY 10/08/14  Yes Jerrol Banana., MD  Misc Natural Products (GLUCOSAMINE CHONDROITIN COMPLX) TABS Take 1 tablet by mouth daily.    Yes Historical Provider, MD  Multiple Vitamin tablet Take 1 tablet by mouth daily.  04/08/10  Yes Historical Provider, MD  nabumetone (RELAFEN) 500 MG tablet TAKE 1 TABLET TWICE DAILY AS NEEDED FOR PAIN Patient taking differently: TAKE 2 TABLETS ( 1000 mg) every morning for PAIN 07/02/15  Yes Lalanya Rufener Maceo Pro., MD  OMEGA-3 FATTY ACIDS PO Take 1 tablet by mouth daily.  04/08/10  Yes Historical Provider, MD  Turmeric, Lear Ng, POWD Take by mouth.    Yes Historical Provider, MD  vitamin C (ASCORBIC ACID) 500 MG tablet Take 500 mg by mouth daily.  04/08/10  Yes Historical Provider, MD  vitamin E 400 UNIT  capsule Take 400 Units by mouth daily.  04/08/10  Yes Historical Provider, MD    Patient Active Problem List   Diagnosis Date Noted  . Chest pressure 07/26/2015  . Paresthesia 07/26/2015  . Arthritis 01/02/2015  . Body mass index (BMI) of 50-59.9 in adult ([place]) 01/02/2015  . Essential (primary) hypertension 01/02/2015  . Adiposity 01/02/2015  . Apnea, sleep 01/02/2015  . Avitaminosis D 01/02/2015    Past Medical History:  Diagnosis Date  . Anxiety    just recently  . Arthritis   . Cancer ([place])    skin cancer removed  . GERD (gastroesophageal reflux disease)   . Hypertension   . Pneumonia   . Sleep apnea    with cpap    Social History   Social History  . Marital status: Married    Spouse name: N/A  . Number of children: N/A  . Years of education: N/A   Occupational History  . Not on file.   Social History Main Topics  . Smoking status: Former Smoker    Years: 22.00    Quit date: 09/05/1996  . Smokeless tobacco: Never Used     Comment: Quit in 1998  . Alcohol use Yes     Comment: occasionally  . Drug use: No  . Sexual activity: Not on file   Other Topics Concern  .  Not on file   Social History Narrative  . No narrative on file    No Known Allergies  Review of Systems  Constitutional: Negative.   Eyes: Negative.   Cardiovascular: Negative.   Gastrointestinal:       Patient frequently feels bloated and gassy  Musculoskeletal: Positive for back pain.  Skin: Negative.   Endo/Heme/Allergies: Negative.   Psychiatric/Behavioral: Negative.     Immunization History  Administered Date(s) Administered  . Pneumococcal Polysaccharide-23 12/21/1991  . Tdap 10/26/2007   Objective:  BP 112/62   Pulse 72   Resp 14   Wt (!) 352 lb (159.7 kg)   BMI 49.09 kg/m   Physical Exam  Constitutional: He is oriented to person, place, and time and well-developed, well-nourished, and in no distress.  Morbidly obese. Pear shaped.  HENT:  Head: Normocephalic and  atraumatic.  Right Ear: External ear normal.  Left Ear: External ear normal.  Nose: Nose normal.  Eyes: Conjunctivae are normal. No scleral icterus.  Neck: Neck supple. No thyromegaly present.  Cardiovascular: Normal rate, regular rhythm and normal heart sounds.   Pulmonary/Chest: Effort normal and breath sounds normal.  Abdominal: Soft.  Lymphadenopathy:    He has no cervical adenopathy.  Neurological: He is alert and oriented to person, place, and time.  Skin: Skin is warm and dry.  Psychiatric: Mood, memory, affect and judgment normal.    Lab Results  Component Value Date   WBC 6.2 01/08/2015   HGB 13.8 06/22/2013   HCT 40.3 01/08/2015   PLT 290 01/08/2015   GLUCOSE 104 (H) 09/06/2015   CHOL 218 (H) 01/08/2015   TRIG 128 01/08/2015   HDL 51 01/08/2015   LDLCALC 141 (H) 01/08/2015   TSH 0.880 01/08/2015   PSA 0.2 06/22/2013   HGBA1C 5.9 07/26/2015    CMP     Component Value Date/Time   NA 138 09/06/2015 0917   NA 139 01/08/2015 0944   K 4.5 09/06/2015 0917   CL 102 09/06/2015 0917   CO2 29 09/06/2015 0917   GLUCOSE 104 (H) 09/06/2015 0917   BUN 11 09/06/2015 0917   BUN 12 01/08/2015 0944   CREATININE 0.72 09/06/2015 0917   CALCIUM 9.5 09/06/2015 0917   PROT 7.0 01/08/2015 0944   ALBUMIN 4.7 01/08/2015 0944   AST 24 01/08/2015 0944   ALT 23 01/08/2015 0944   ALKPHOS 81 01/08/2015 0944   BILITOT 0.4 01/08/2015 0944   GFRNONAA >60 09/06/2015 0917   GFRAA >60 09/06/2015 0917    Assessment and Plan :  1. Body mass index (BMI) of 50-59.9 in adult [facility]) Dietary changes discussed at length. Patient has started exercising.  2. Sleep apnea Try auto CPAP 6-13 cm of water. As he loses weight and this should work well. I do think he is swallowing air with the CPAP and this is creating the symptoms of gas and bloating 3.  back pain Followed by Dr. Drema Dallas 4. Hypertension Controlled.   Miguel Aschoff MD [facility] 10/23/2015 4:40 PM

## 2015-10-27 ENCOUNTER — Other Ambulatory Visit: Payer: Self-pay | Admitting: Family Medicine

## 2015-10-30 ENCOUNTER — Encounter: Payer: Self-pay | Admitting: Dietician

## 2015-10-30 NOTE — Telephone Encounter (Signed)
Sent email to patient to check on his progress since initial MNT visit on 09/23/15.

## 2015-11-08 ENCOUNTER — Other Ambulatory Visit: Payer: Self-pay | Admitting: Sports Medicine

## 2015-11-08 DIAGNOSIS — M5412 Radiculopathy, cervical region: Secondary | ICD-10-CM

## 2015-11-08 DIAGNOSIS — S39012A Strain of muscle, fascia and tendon of lower back, initial encounter: Secondary | ICD-10-CM

## 2015-11-17 ENCOUNTER — Ambulatory Visit
Admission: RE | Admit: 2015-11-17 | Discharge: 2015-11-17 | Disposition: A | Discharge status: Home / Self Care | Payer: BC Managed Care – PPO | Source: Ambulatory Visit | Attending: Sports Medicine | Admitting: Sports Medicine

## 2015-11-17 DIAGNOSIS — S39012A Strain of muscle, fascia and tendon of lower back, initial encounter: Secondary | ICD-10-CM

## 2015-11-17 DIAGNOSIS — M5412 Radiculopathy, cervical region: Secondary | ICD-10-CM

## 2015-12-24 ENCOUNTER — Other Ambulatory Visit: Payer: Self-pay

## 2015-12-24 ENCOUNTER — Encounter: Payer: Self-pay | Admitting: General Surgery

## 2015-12-24 DIAGNOSIS — Z1211 Encounter for screening for malignant neoplasm of colon: Secondary | ICD-10-CM

## 2015-12-24 NOTE — Progress Notes (Signed)
Refer to surg 

## 2016-01-21 ENCOUNTER — Encounter: Payer: Self-pay | Admitting: General Surgery

## 2016-01-21 ENCOUNTER — Ambulatory Visit (INDEPENDENT_AMBULATORY_CARE_PROVIDER_SITE_OTHER): Payer: BC Managed Care – PPO | Admitting: General Surgery

## 2016-01-21 VITALS — BP 158/82 | HR 72 | Resp 16 | Ht 71.0 in | Wt 337.0 lb

## 2016-01-21 DIAGNOSIS — Z1211 Encounter for screening for malignant neoplasm of colon: Secondary | ICD-10-CM

## 2016-01-21 MED ORDER — POLYETHYLENE GLYCOL 3350 17 GM/SCOOP PO POWD: 1.0000 | Freq: Once | ORAL | 0 refills | Status: AC

## 2016-01-21 NOTE — Patient Instructions (Addendum)
Colonoscopy, Adult A colonoscopy is an exam to look at the entire large intestine. During the exam, a lubricated, bendable tube is inserted into the anus and then passed into the rectum, colon, and other parts of the large intestine. A colonoscopy is often done as a part of normal colorectal screening or in response to certain symptoms, such as anemia, persistent diarrhea, abdominal pain, and blood in the stool. The exam can help screen for and diagnose medical problems, including:  Tumors.  Polyps.  Inflammation.  Areas of bleeding. Tell a health care provider about:  Any allergies you have.  All medicines you are taking, including vitamins, herbs, eye drops, creams, and over-the-counter medicines.  Any problems you or family members have had with anesthetic medicines.  Any blood disorders you have.  Any surgeries you have had.  Any medical conditions you have.  Any problems you have had passing stool. What are the risks? Generally, this is a safe procedure. However, problems may occur, including:  Bleeding.  A tear in the intestine.  A reaction to medicines given during the exam.  Infection (rare). What happens before the procedure? Eating and drinking restrictions  Follow instructions from your health care provider about eating and drinking, which may include:  A few days before the procedure - follow a low-fiber diet. Avoid nuts, seeds, dried fruit, raw fruits, and vegetables.  1-3 days before the procedure - follow a clear liquid diet. Drink only clear liquids, such as clear broth or bouillon, black coffee or tea, clear juice, clear soft drinks or sports drinks, gelatin desert, and popsicles. Avoid any liquids that contain red or purple dye.  On the day of the procedure - do not eat or drink anything during the 2 hours before the procedure, or within the time period that your health care provider recommends. Bowel prep  If you were prescribed an oral bowel prep to  clean out your colon:  Take it as told by your health care provider. Starting the day before your procedure, you will need to drink a large amount of medicated liquid. The liquid will cause you to have multiple loose stools until your stool is almost clear or light green.  If your skin or anus gets irritated from diarrhea, you may use these to relieve the irritation:  Medicated wipes, such as adult wet wipes with aloe and vitamin E.  A skin soothing-product like petroleum jelly.  If you vomit while drinking the bowel prep, take a break for up to 60 minutes and then begin the bowel prep again. If vomiting continues and you cannot take the bowel prep without vomiting, call your health care provider. General instructions  Ask your health care provider about changing or stopping your regular medicines. This is especially important if you are taking diabetes medicines or blood thinners.  Plan to have someone take you home from the hospital or clinic. What happens during the procedure?  An IV tube may be inserted into one of your veins.  You will be given medicine to help you relax (sedative).  To reduce your risk of infection:  Your health care team will wash or sanitize their hands.  Your anal area will be washed with soap.  You will be asked to lie on your side with your knees bent.  Your health care provider will lubricate a long, thin, flexible tube. The tube will have a camera and a light on the end.  The tube will be inserted into your anus.  The tube will be gently eased through your rectum and colon.  Air will be delivered into your colon to keep it open. You may feel some pressure or cramping.  The camera will be used to take images during the procedure.  A small tissue sample may be removed from your body to be examined under a microscope (biopsy). If any potential problems are found, the tissue will be sent to a lab for testing.  If small polyps are found, your health  care provider may remove them and have them checked for cancer cells.  The tube that was inserted into your anus will be slowly removed. The procedure may vary among health care providers and hospitals. What happens after the procedure?  Your blood pressure, heart rate, breathing rate, and blood oxygen level will be monitored until the medicines you were given have worn off.  Do not drive for 24 hours after the exam.  You may have a small amount of blood in your stool.  You may pass gas and have mild abdominal cramping or bloating due to the air that was used to inflate your colon during the exam.  It is up to you to get the results of your procedure. Ask your health care provider, or the department performing the procedure, when your results will be ready. This information is not intended to replace advice given to you by your health care provider. Make sure you discuss any questions you have with your health care provider. Document Released: 02/14/2000 Document Revised: 09/06/2015 Document Reviewed: 04/30/2015 Elsevier Interactive Patient Education  2017 [employer].  The patient is scheduled for a Colonoscopy and excision of skin tag at [facility] on 02/06/16. They are aware to call the day before to get their arrival time. He will stop his Fish oil one week prior. Miralax prescription has been sent into the patient's pharmacy. The patient is aware of date and instructions.

## 2016-01-21 NOTE — Progress Notes (Signed)
Patient ID: Terry Serve., male   DOB: 1960/09/25, 55 y.o.   MRN: [id number]  Chief Complaint  Patient presents with  . Colonoscopy    HPI Terry Horn. is a 55 y.o. male here today for a evaluation of a screening colonoscopy. Patient states he has been having a problem with bleeding hemorrhoids.He states the color is bright red and it is on the paper and some in the bowl. Moves his bowels daily.  HPI  Past Medical History:  Diagnosis Date  . Anxiety    just recently  . Arthritis   . Cancer ([place])    skin cancer removed  . GERD (gastroesophageal reflux disease)   . Hypertension   . Pneumonia   . Sleep apnea    with cpap    Past Surgical History:  Procedure Laterality Date  . TONSILLECTOMY      Family History  Problem Relation Age of Onset  . Bipolar disorder Mother   . Thyroid disease Mother   . Depression Sister   . Prostate cancer Paternal Uncle   . Prostate cancer Paternal Grandfather     Social History Social History  Substance Use Topics  . Smoking status: Former Smoker    Years: 22.00    Quit date: 09/05/1996  . Smokeless tobacco: Never Used     Comment: Quit in 1998  . Alcohol use Yes     Comment: occasionally    No Known Allergies  Current Outpatient Prescriptions  Medication Sig Dispense Refill  . aspirin 81 MG tablet Take 81 mg by mouth daily.     . cetirizine-pseudoephedrine (ZYRTEC-D ALLERGY & CONGESTION) 5-120 MG tablet Take 1 tablet by mouth 2 (two) times daily.     . Cholecalciferol 1000 UNITS tablet Take 1,000 Units by mouth daily.     Marland Kitchen losartan-hydrochlorothiazide (HYZAAR) 100-12.5 MG tablet TAKE 1 TABLET BY MOUTH DAILY 30 tablet 12  . Misc Natural Products (GLUCOSAMINE CHONDROITIN COMPLX) TABS Take 1 tablet by mouth daily.     . Multiple Vitamin tablet Take 1 tablet by mouth daily.     . nabumetone (RELAFEN) 500 MG tablet TAKE 1 TABLET TWICE DAILY AS NEEDED FOR PAIN (Patient taking differently: TAKE 2 TABLETS ( 1000 mg) every  morning for PAIN) 60 tablet 11  . OMEGA-3 FATTY ACIDS PO Take 1 tablet by mouth daily.     . Turmeric, Curcuma Longa, POWD Take by mouth.     . vitamin C (ASCORBIC ACID) 500 MG tablet Take 500 mg by mouth daily.     . vitamin E 400 UNIT capsule Take 400 Units by mouth daily.     . polyethylene glycol powder (GLYCOLAX/MIRALAX) powder Take 255 g by mouth once. Mix whole container with 64 ounces of clear liquids 255 g 0   No current facility-administered medications for this visit.     Review of Systems Review of Systems  Constitutional: Negative.   Respiratory: Negative.   Cardiovascular: Negative.     Blood pressure (!) 158/82, pulse 72, resp. rate 16, height 5\' 11"  (1.803 m), weight (!) 337 lb (152.9 kg).  Physical Exam Physical Exam  Constitutional: He is oriented to person, place, and time. He appears well-developed and well-nourished.  Eyes: Conjunctivae are normal. No scleral icterus.  Neck: Neck supple.  Cardiovascular: Normal rate, regular rhythm and normal heart sounds.   Pulmonary/Chest: Effort normal and breath sounds normal.  Genitourinary:     Genitourinary Comments: Large external skin tag.  Lymphadenopathy:  He has no cervical adenopathy.  Neurological: He is alert and oriented to person, place, and time.  Skin: Skin is warm and dry.    Data Reviewed Basic metabolic panel from [id number] showed normal electrolytes. Scant elevation of blood sugar 104, creatinine 0.7 with an estimated GFR greater than 60.  CBC dated 01/08/2015 showed a hemoglobin of 13.6 with an MCV of 82., Platelet count 290,000.  Assessment    Candidate for screening colonoscopy.  Large external skin tag, possible extension of the anal canal.    Plan    The external skin tag is massive and I am sure makes perianal cleansing difficult. We discussed excision and I recommended this be completed in the operating room for better exposure and lighting. This can be done the day of his  colonoscopy.    Colonoscopy with possible biopsy/polypectomy prn: Information regarding the procedure, including its potential risks and complications (including but not limited to perforation of the bowel, which may require emergency surgery to repair, and bleeding) was verbally given to the patient. Educational information regarding lower intestinal endoscopy was given to the patient. Written instructions for how to complete the bowel prep using Miralax were provided. The importance of drinking ample fluids to avoid dehydration as a result of the prep emphasized.  The patient is scheduled for a Colonoscopy and excision of skin tag at [facility] on 02/06/16. They are aware to call the day before to get their arrival time. He will stop his Fish oil one week prior. Miralax prescription has been sent into the patient's pharmacy. The patient is aware of date and instructions.   The patient will be asked to bring his CPAP with the for his procedures.  This information has been scribed by Gaspar Cola CMA.  [name] 01/21/2016, 8:16 PM

## 2016-01-30 ENCOUNTER — Encounter
Admission: RE | Admit: 2016-01-30 | Discharge: 2016-01-30 | Disposition: A | Discharge status: Home / Self Care | Payer: BC Managed Care – PPO | Source: Ambulatory Visit | Attending: General Surgery | Admitting: General Surgery

## 2016-01-30 NOTE — Patient Instructions (Signed)
  Your procedure is scheduled on: 02-06-16 [facility])  Report to Same Day Surgery 2nd floor medical mall [facility] Entrance-take elevator on left to 2nd floor.  Check in with surgery information desk.) To find out your arrival time please call [phone] between 1PM - 3PM on 02-05-16 [facility])  Remember: Instructions that are not followed completely may result in serious medical risk, up to and including death, or upon the discretion of your surgeon and anesthesiologist your surgery may need to be rescheduled.    _x___ 1. Do not eat food or drink liquids after midnight. No gum chewing or hard candies.     __x__ 2. No Alcohol for 24 hours before or after surgery.   __x__3. No Smoking for 24 prior to surgery.   ____  4. Bring all medications with you on the day of surgery if instructed.    __x__ 5. Notify your doctor if there is any change in your medical condition     (cold, fever, infections).     Do not wear jewelry, make-up, hairpins, clips or nail polish.  Do not wear lotions, powders, or perfumes. You may wear deodorant.  Do not shave 48 hours prior to surgery. Men may shave face and neck.  Do not bring valuables to the hospital.    [facility] is not responsible for any belongings or valuables.               Contacts, dentures or bridgework may not be worn into surgery.  Leave your suitcase in the car. After surgery it may be brought to your room.  For patients admitted to the hospital, discharge time is determined by your treatment team.   Patients discharged the day of surgery will not be allowed to drive home.  You will need someone to drive you home and stay with you the night of your procedure.    Please read over the following fact sheets that you were given:   [facility] Preparing for Surgery and or MRSA Information   _x___ Take these medicines the morning of surgery with A SIP OF WATER:    1. NONE  2.  3.  4.  5.  6.  ____Fleets enema or Magnesium  Citrate as directed.   ____ Use CHG Soap or sage wipes as directed on instruction sheet   ____ Use inhalers on the day of surgery and bring to hospital day of surgery  ____ Stop metformin 2 days prior to surgery    ____ Take 1/2 of usual insulin dose the night before surgery and none on the morning of  surgery.   _X___ Stop Aspirin, Coumadin, Pllavix ,Eliquis, Effient, or Pradaxa-OK TO CONTINUE 81 MG ASPIRIN-DO NOT TAKE AM OF SURGERY  __ Stop Anti-inflammatories such as Advil, Aleve, Ibuprofen, Motrin, Naproxen,          Naprosyn, Goodies powders or aspirin products. Ok to take Tylenol.   _X___ Stop supplements until after surgery-STOP GLUCOSAMINE CHONDROITIN, TURMERIC, VIT C, E AND FISH OIL NOW  _X___ Bring C-Pap to the hospital.

## 2016-01-30 NOTE — Pre-Procedure Instructions (Signed)
[name].  ECHO COMPLETE WO IMAGE ENHANCING AGENT  Order# [id number]  Reading physician: Teodoro Spray, MD Ordering physician: Corey Skains, MD Study date: 09/24/15  Study Result   Result status: Final result                   *[place], [place] [id number]                            [phone]  ------------------------------------------------------------------- Transthoracic Echocardiography  Patient:    Terry Horn, Terry Horn MR #:       [id number] Study Date: 09/24/2015 Gender:     M Age:        55 Height:     180.3 cm Weight:     160.9 kg BSA:        2.92 m^2 Pt. Status: Room:   ATTENDING    Volney Presser  REFERRING    Kowalski, [place] RDCS  PERFORMING   [facility]  cc:  ------------------------------------------------------------------- LV EF: 55% -   65%  ------------------------------------------------------------------- Indications:      Dyspnea on exertion.  ------------------------------------------------------------------- Study Conclusions  - Left ventricle: Wall thickness was increased in a pattern of mild   LVH. Systolic function was normal. The estimated ejection   fraction was in the range of 55% to 65%. - Aortic valve: Valve area (Vmax): 2.62 cm^2. - Right ventricle: The cavity size was mildly dilated.  ------------------------------------------------------------------- Study data:   Study status:  Routine.          Transthoracic echocardiography.  M-mode, complete 2D, spectral Doppler, and color Doppler.  Birthdate:  Patient birthdate: 07-19-1960.  Age:  Patient is 55 yr old.  Sex:  Gender: male.    BMI: 49.5 kg/m^2.  Patient status:  Outpatient.  Study date:  Study date: 09/24/2015. Study time: 07:51  AM.  -------------------------------------------------------------------  ------------------------------------------------------------------- Left ventricle:   Wall thickness was increased in a pattern of mild LVH.   Systolic function was normal. The estimated ejection fraction was in the range of 55% to 65%.  ------------------------------------------------------------------- Aortic valve:  Poorly visualized.  Doppler:     Peak velocity ratio of LVOT to aortic valve: 0.83. Valve area (Vmax): 2.62 cm^2. Indexed valve area (Vmax): 0.9 cm^2/m^2.    Peak gradient (S): 5 mm Hg.  ------------------------------------------------------------------- Mitral valve:  Poorly visualized.  Doppler:     Peak gradient (D): 5 mm Hg.  ------------------------------------------------------------------- Left atrium:  The atrium was normal in size.  ------------------------------------------------------------------- Right ventricle:  The cavity size was mildly dilated. Systolic function was normal.  ------------------------------------------------------------------- Tricuspid valve:  Poorly visualized.  ------------------------------------------------------------------- Right atrium:  The atrium was at the upper limits of normal in size.  ------------------------------------------------------------------- Measurements   Left ventricle                            Value          Reference  LV ID, ED, PLAX chordal  50.5  mm       43 - 52  LV ID, ES, PLAX chordal                   31.3  mm       23 - 38  LV fx shortening, PLAX chordal            38    %        >=29  LV PW thickness, ED                       16.2  mm       ---------  IVS/LV PW ratio, ED                       0.76           <=1.3  LV e&', lateral                            9.46  cm/s     ---------  LV E/e&', lateral                          11.42          ---------  LV e&', medial                              6.74  cm/s     ---------  LV E/e&', medial                           16.02          ---------  LV e&', average                            8.1   cm/s     ---------  LV E/e&', average                          13.33          ---------    Ventricular septum                        Value          Reference  IVS thickness, ED                         12.3  mm       ---------    LVOT                                      Value          Reference  LVOT ID, S                                20    mm       ---------  LVOT area  3.14  cm^2     ---------  LVOT peak velocity, S                     89.2  cm/s     ---------    Aortic valve                              Value          Reference  Aortic valve peak velocity, S             107   cm/s     ---------  Aortic peak gradient, S                   5     mm Hg    ---------  Velocity ratio, peak, LVOT/AV             0.83           ---------  Aortic valve area, peak velocity          2.62  cm^2     ---------  Aortic valve area/bsa, peak               0.9   cm^2/m^2 ---------  velocity    Aorta                                     Value          Reference  Aortic root ID, ED                        33    mm       ---------    Left atrium                               Value          Reference  LA ID, A-P, ES                            38    mm       ---------  LA ID/bsa, A-P                            1.3   cm/m^2   <=2.2  LA volume, S                              81.9  ml       ---------  LA volume/bsa, S                          28    ml/m^2   ---------  LA volume, ES, 1-p A4C                    72.9  ml       ---------  LA volume/bsa, ES, 1-p A4C                24.9  ml/m^2   ---------  LA volume, ES, 1-p A2C  89.6  ml       ---------  LA volume/bsa, ES, 1-p A2C                30.6  ml/m^2   ---------    Mitral valve                              Value          Reference  Mitral E-wave peak velocity                108   cm/s     ---------  Mitral A-wave peak velocity               87.8  cm/s     ---------  Mitral deceleration time                  201   ms       150 - 230  Mitral peak gradient, D                   5     mm Hg    ---------  Mitral E/A ratio, peak                    1.2            ---------    Right ventricle                           Value          Reference  RV ID, ED, PLAX                           30.9  mm       19 - 38  TAPSE                                     23.4  mm       ---------    Pulmonic valve                            Value          Reference  Pulmonic valve peak velocity, S           59.7  cm/s     ---------  Legend: (L)  and  (H)  mark values outside specified reference range.  ------------------------------------------------------------------- Prepared and Electronically Authenticated by  Bartholome Bill, MD 2017-07-25T09:35:08  PACS Images   Show images for ECHOCARDIOGRAM COMPLETE  Patient Information   Patient Name Zariel, Boling. Sex Male DOB Jun 14, 1960 SSN [id number]  Reason For Exam  Priority: Routine  Other - See Comments Section  Dx: Dyspnea on exertion [R06.09 (ICD-10-CM)]  Comments: Dyspnea on exertion    Dr Nehemiah Massed to read  Surgical History   Surgical History   No past medical history on file.    Other Surgical History   Procedure Laterality Date Comment Source  TONSILLECTOMY    Provider    Performing Technologist/Nurse   Performing Technologist/Nurse: Laqueta Jean Hege                    Implants     No active  implants to display in this view.  Order-Level Documents:   There are no order-level documents.  Encounter-Level Documents - 09/24/2015:   Scan on 09/25/2015 12:39 PM by Provider Default, MD      Signed   Electronically signed by Teodoro Spray, MD on 09/24/15 at 0935 EDT  Printable Result Report   Result Report

## 2016-01-30 NOTE — Pre-Procedure Instructions (Signed)
Terry Dibble, MD - 10/01/2015 4:15 PM EDT Formatting of this note may be different from the original. Established Patient Visit   Chief Complaint: Chief Complaint  Patient presents with  . Follow-up  myoview and echo at [facility]  . Shortness of Breath  Date of Service: 10/01/2015 Date of Birth: Apr 06, 1960 PCP: Eulas Post, MD  History of Present Illness: Terry Horn is a 55 y.o.male patient  Shortness of breath The patient presents with acute on chronic shortness of breath worsening with increased severity over the last 2 months which occurs with mild exertion and limits ADLs associated with climbing stairs and exercise and relived by rest and lasting intermittent (<1 minute). Other related symptoms include fatigue. The differential diagnosis includes sleep apnea, hypertension, valve disease and decrease exercise tolerance  Stress Test Exercise SESTAMIBI was performed showing Normal test.  Echocardiogram The patient has had an echocardiogram showing Normal LV LVEF > 55% Mild MR Mild TR without pulmonary hypertension Essential hypertension The patient currently has a diagnosis of essential hypertension with no evidence of secondary causes of high blood pressure at this time. The patient has been on appropriate medication management without current evidence of significant side effects as well as risk reduction for cardiovascular disease complication which appears stable today. We have discussed treatment goals and current guidelines for hypertension therapy. The patient understands risks and benefits of medication management as well as risk factor modification. They agree to continuation of this current medical regimen. Cardiovascular Risk Score There is concerns that the patient has an elevated cardiovascular risk over the next 10 years. Therefore, we have evaluated there percent cardiovascular risk and the potential need for risk factor modification. Currently the patient has  cardiovascular risk factors including mixed hyperlipidemia and hypertension. The patient has a 10 year atherosclerotic cardiovascular disease risk of 6.3%. This estimation is calculated by using the [employer] atherosclerotic cardiovascular disease risk estimator. Preoperative Assessment Profile for back procedures Risk factors for cardiac complication with surgery: Positive for: none Negative for: Diabetes, Cardiomyopathy or chf, Coronary artery disease, Chronic Kidney disease and Peripheral vascular disease Active cardiovascular conditions: Positive [id number] Negative for: Angina or anginal equivalent, Recent infartion, Congestive heart failure symptoms, Dysrhythmia and Symptomatic valve disease Functional capacity >4 Mets Risk of surgery and/or procedure low risk Possible need and adjustments in therapy prior to surgery no Overall risk of cardiac complication with surgery and/or procedure is low, <1%  Past Medical and Surgical History  Past Medical History Past Medical History:  Diagnosis Date  . Hyperlipidemia  . Hypertension  . Sleep apnea   Past Surgical History He has a past surgical history that includes tonsilectomy.   Medications and Allergies  Current Medications  Current Outpatient Prescriptions  Medication Sig Dispense Refill  . ascorbic acid, vitamin C, (VITAMIN C) 500 MG tablet Take by mouth once daily.  Marland Kitchen aspirin-calcium carbonate 81 mg-300 mg calcium(777 mg) Tab Take by mouth.  . cetirizine (ZYRTEC) 10 mg capsule Take 10 mg by mouth once daily.  . cholecalciferol (VITAMIN D3) 1,000 unit tablet Take 1,000 Units by mouth once daily.  Marland Kitchen glucosam-chond-hrb 149-hyal ac (GLUCOS CHOND CPLX ADVANCED) 750 mg-100 mg- 125 mg-1.65 mg Tab Take by mouth once daily.  Marland Kitchen losartan-hydrochlorothiazide (HYZAAR) 100-12.5 mg tablet TAKE 1 TABLET BY MOUTH DAILY  . MULTIVITAMIN (MULTIPLE VITAMIN ESSENTIAL ORAL) Take by mouth once daily.  . nabumetone (RELAFEN) 500  MG tablet 1 tablet once daily.  Marland Kitchen omega-3 fatty acids-fish oil 300-1,000 mg capsule Take  by mouth once daily.  Marland Kitchen turmeric, bulk, 100 % Powd Take by mouth once daily.  . vitamin E 400 UNIT capsule Take by mouth once daily.   No current facility-administered medications for this visit.   Allergies: Review of patient's allergies indicates no known allergies.  Social and Family History  Social History reports that he quit smoking about 19 years ago. He has never used smokeless tobacco. He reports that he drinks alcohol. He reports that he does not use illicit drugs.  Family History Family History  Problem Relation Age of Onset  . Depression Mother  . Thyroid disease Mother  . Neuropathy Father  . Anxiety disorder Father  . Heart disease Maternal Grandfather  . Heart disease Paternal Grandfather   Review of Systems   Review of Systems  Positive for sob  Negative for weight gain weight loss, weakness, vision change, hearing loss, cough, congestion, PND, orthopnea, heartburn, nausea, diaphoresis, vomiting, diarrhea, bloody stool, melena, stomach pain, extremity pain, leg weakness, leg cramping, leg blood clots, headache, blackouts, nosebleed, trouble swallowing, mouth pain, urinary frequency, urination at night, muscle weakness, skin lesions, skin rashes, tingling ,ulcers, numbness, anxiety, and/or depression Physical Examination   Vitals:BP (P) 122/80  Pulse (P) 72  Resp (P) 14  Ht (P) 180.3 cm (5\' 11" )  Wt (!) (P) 159.2 kg (351 lb)  BMI (P) 48.95 kg/m2 Ht:(P) 180.3 cm (5\' 11" ) Wt:(!) (P) 159.2 kg (351 lb) [id number] surface area is 2.82 meters squared (pended). Body mass index is 48.95 kg/(m^2) (pended). Appearance: well appearing in no acute distress HEENT: Pupils equally reactive to light and accomodation, no xanthalasma  Neck: Supple, no apparent thyromegaly, masses, or lymphadenopathy  Lungs: normal respiratory effort; no crackles, no rhonchi, no wheezes Heart: Regular rate and  rhythm. Normal S1 S2 No gallops, murmur, no rub, PMI is normal size and placement. carotid upstroke normal without bruit. Jugular venous pressure is normal Abdomen: soft, nontender, with normal bowel sounds. No apparent hepatosplenomegally. Abdominal aorta is normal size without bruit Extremities: no edema, no ulcers, no clubbing, no cyanosis Peripheral Pulses: 2+ in upper extremities, 2+ femoral pulses bilaterally, 2+lower extremity  Musculoskeletal; Normal muscle tone without kyphosis Neurological: Oriented and Alert, Cranial nerves intact  Assessment   55 y.o. male with  Encounter Diagnoses  Name Primary?  . OSA (obstructive sleep apnea)  . Shortness of breath Yes  . Benign essential HTN  . Mixed hyperlipidemia   Plan  -Continue current medical regimen for hypertension control which is stable at this time and without apparent significant side effects or symptoms of medications at this time. Further treatment goals of low sodium diet for additive effects of these medications have been discussed today as well. -Continue diligent use of CPAP machine for symptom relief as well as reduction of pulmonary and cardiac manifestations. -No further cardiovascular intervention and/or medication additions due to only minimal abnormality of cholesterol levels and low 10 year cardiovascular risk score. The patient will continue healthy lifestyle measures to achieve appropriate cardiovascular risk reduction. -Patient was counseled today about the significance of diet and exercise for further risk reduction of cardiovascular events, risk factor reduction, weight gain, and possible improvements in quality of life measures. These lifestyle modifications were discussed including a walking regimen, a diet rich in fruits and vegetables, the DASH diet, and appropriate use of medication management. -No further cardiac intervention at this time due to normal stress test without evidence of myocardial ischemia.  Patient is instructed to continue to follow for any  new symptoms and issues which may be consistent with anginal equivalent and report them for further evaluation. -Proceed to surgery and/or invasive procedure without restriction to pre or post operative and/or procedural care. The patient is at lowest risk possible for cardiovascular complications with surgical intervention and/or invasive procedure. Currently has no evidence active and/or significant angina and/or congestive heart failure. The patient may discontinue aspirin 7 days prior to procedure and restart at a safe period thereafter  No orders of the defined types were placed in this encounter.  Return in about 1 year (around 09/30/2016).  Terry Dibble, MD       Plan of Treatment - as of this encounter  Not on file   Visit Diagnoses    Diagnosis  Shortness of breath - Primary  OSA (obstructive sleep apnea)  Obstructive sleep apnea (adult) (pediatric)   Benign essential HTN  Mixed hyperlipidemia   Images Document Information   Service Providers Document Coverage Dates Aug. 01, 2017  [place] [phone] (Work) [place], [place] [address]   Encounter Providers Bruce Lenn Sink MD (Attending) tel:[phone] (Work) fax:[phone] [address] [facility] [place], [place] [address]   Encounter Date Aug. 01, 2017

## 2016-01-30 NOTE — Pre-Procedure Instructions (Signed)
ECG 12-lead7/12/2015 [place] Component Name Value Ref Range  Vent Rate (bpm) 80   PR Interval (msec) 156   QRS Interval (msec) 88   QT Interval (msec) 378   QTc (msec) 435   Result Narrative  Normal sinus rhythm Normal ECG No previous ECGs available I reviewed and concur with this report. Electronically signed [id number] MD, Darnell Level [phone]) on 09/19/2015 1:38:56 PM  Status Results Details    Erroneous Encounter on 09/09/2015 [place]")' href="[email or web address]/">Encounter Summary

## 2016-02-05 ENCOUNTER — Inpatient Hospital Stay: Admission: RE | Admit: 2016-02-05 | Payer: BC Managed Care – PPO | Source: Ambulatory Visit

## 2016-02-06 ENCOUNTER — Encounter
Admission: RE | Disposition: A | Discharge status: Home / Self Care | Payer: Self-pay | Source: Ambulatory Visit | Attending: General Surgery

## 2016-02-06 ENCOUNTER — Ambulatory Visit: Payer: BC Managed Care – PPO | Admitting: Anesthesiology

## 2016-02-06 ENCOUNTER — Ambulatory Visit
Admission: RE | Admit: 2016-02-06 | Discharge: 2016-02-06 | Disposition: A | Discharge status: Home / Self Care | Payer: BC Managed Care – PPO | Source: Ambulatory Visit | Attending: General Surgery | Admitting: General Surgery

## 2016-02-06 DIAGNOSIS — G4730 Sleep apnea, unspecified: Secondary | ICD-10-CM | POA: Insufficient documentation

## 2016-02-06 DIAGNOSIS — Z86010 Personal history of colonic polyps: Secondary | ICD-10-CM | POA: Insufficient documentation

## 2016-02-06 DIAGNOSIS — Z1211 Encounter for screening for malignant neoplasm of colon: Secondary | ICD-10-CM | POA: Insufficient documentation

## 2016-02-06 DIAGNOSIS — Z7982 Long term (current) use of aspirin: Secondary | ICD-10-CM | POA: Diagnosis not present

## 2016-02-06 DIAGNOSIS — K644 Residual hemorrhoidal skin tags: Secondary | ICD-10-CM | POA: Insufficient documentation

## 2016-02-06 DIAGNOSIS — Z85828 Personal history of other malignant neoplasm of skin: Secondary | ICD-10-CM | POA: Insufficient documentation

## 2016-02-06 DIAGNOSIS — I10 Essential (primary) hypertension: Secondary | ICD-10-CM | POA: Insufficient documentation

## 2016-02-06 DIAGNOSIS — Z87891 Personal history of nicotine dependence: Secondary | ICD-10-CM | POA: Insufficient documentation

## 2016-02-06 DIAGNOSIS — Z79899 Other long term (current) drug therapy: Secondary | ICD-10-CM | POA: Insufficient documentation

## 2016-02-06 DIAGNOSIS — D120 Benign neoplasm of cecum: Secondary | ICD-10-CM | POA: Insufficient documentation

## 2016-02-06 HISTORY — PX: EXCISION OF SKIN TAG: SHX6270

## 2016-02-06 LAB — BASIC METABOLIC PANEL
Anion gap: 10 (ref 5–15)
BUN: 10 mg/dL (ref 6–20)
CHLORIDE: 98 mmol/L — AB (ref 101–111)
CO2: 29 mmol/L (ref 22–32)
CREATININE: 0.71 mg/dL (ref 0.61–1.24)
Calcium: 9.1 mg/dL (ref 8.9–10.3)
Glucose, Bld: 101 mg/dL — ABNORMAL HIGH (ref 65–99)
Potassium: 3.4 mmol/L — ABNORMAL LOW (ref 3.5–5.1)
SODIUM: 137 mmol/L (ref 135–145)

## 2016-02-06 SURGERY — COLONOSCOPY WITH PROPOFOL
Anesthesia: General

## 2016-02-06 SURGERY — EXCISION, SKIN TAG
Anesthesia: General

## 2016-02-06 MED ORDER — LACTATED RINGERS IV SOLN
INTRAVENOUS | Status: DC
  Administered 2016-02-06: 13:00:00 via INTRAVENOUS

## 2016-02-06 MED ORDER — ACETAMINOPHEN 10 MG/ML IV SOLN
INTRAVENOUS | Status: AC
  Administered 2016-02-06: 1000 mg via INTRAVENOUS
  Filled 2016-02-06: qty 100

## 2016-02-06 MED ORDER — BUPIVACAINE HCL (PF) 0.5 % IJ SOLN
INTRAMUSCULAR | Status: AC
  Filled 2016-02-06: qty 30

## 2016-02-06 MED ORDER — PHENYLEPHRINE HCL 10 MG/ML IJ SOLN
INTRAMUSCULAR | Status: DC | PRN
  Administered 2016-02-06 (×6): 100 ug via INTRAVENOUS

## 2016-02-06 MED ORDER — HYDROCODONE-ACETAMINOPHEN 5-325 MG PO TABS: 1.0000 | ORAL_TABLET | ORAL | 0 refills | Status: DC | PRN

## 2016-02-06 MED ORDER — SODIUM CHLORIDE 0.9 % IJ SOLN
INTRAMUSCULAR | Status: DC | PRN
  Administered 2016-02-06: 6 mL via INTRAVENOUS

## 2016-02-06 MED ORDER — FENTANYL CITRATE (PF) 100 MCG/2ML IJ SOLN: 25.0000 ug | INTRAMUSCULAR | Status: DC | PRN

## 2016-02-06 MED ORDER — BACITRACIN ZINC 500 UNIT/GM EX OINT
TOPICAL_OINTMENT | CUTANEOUS | Status: AC
Start: 2016-02-06 — End: 2016-02-06
  Filled 2016-02-06: qty 28.35

## 2016-02-06 MED ORDER — MIDAZOLAM HCL 2 MG/2ML IJ SOLN
INTRAMUSCULAR | Status: DC | PRN
  Administered 2016-02-06: 2 mg via INTRAVENOUS

## 2016-02-06 MED ORDER — EPINEPHRINE PF 1 MG/ML IJ SOLN
INTRAMUSCULAR | Status: AC
  Filled 2016-02-06: qty 1

## 2016-02-06 MED ORDER — SODIUM CHLORIDE 0.9 % IV SOLN
1.0000 g | Freq: Once | INTRAVENOUS | Status: AC
  Administered 2016-02-06: 1 g via INTRAVENOUS
  Filled 2016-02-06: qty 1

## 2016-02-06 MED ORDER — ACETAMINOPHEN 10 MG/ML IV SOLN
1000.0000 mg | Freq: Once | INTRAVENOUS | Status: AC
  Administered 2016-02-06: 1000 mg via INTRAVENOUS

## 2016-02-06 MED ORDER — ONDANSETRON HCL 4 MG/2ML IJ SOLN
INTRAMUSCULAR | Status: DC | PRN
  Administered 2016-02-06: 4 mg via INTRAVENOUS

## 2016-02-06 MED ORDER — ONDANSETRON HCL 4 MG/2ML IJ SOLN: 4.0000 mg | Freq: Once | INTRAMUSCULAR | Status: DC | PRN

## 2016-02-06 MED ORDER — PROPOFOL 10 MG/ML IV BOLUS
INTRAVENOUS | Status: DC | PRN
  Administered 2016-02-06: 50 mg via INTRAVENOUS

## 2016-02-06 MED ORDER — FAMOTIDINE 20 MG PO TABS
ORAL_TABLET | ORAL | Status: AC
  Administered 2016-02-06: 20 mg via ORAL
  Filled 2016-02-06: qty 1

## 2016-02-06 MED ORDER — PROPOFOL 500 MG/50ML IV EMUL
INTRAVENOUS | Status: DC | PRN
  Administered 2016-02-06: 150 ug/kg/min via INTRAVENOUS

## 2016-02-06 MED ORDER — BUPIVACAINE-EPINEPHRINE (PF) 0.25% -1:200000 IJ SOLN
INTRAMUSCULAR | Status: AC
  Filled 2016-02-06: qty 30

## 2016-02-06 MED ORDER — FAMOTIDINE 20 MG PO TABS
20.0000 mg | ORAL_TABLET | Freq: Once | ORAL | Status: AC
  Administered 2016-02-06: 20 mg via ORAL

## 2016-02-06 MED ORDER — LIDOCAINE HCL (CARDIAC) 10 MG/ML IV SOLN
INTRAVENOUS | Status: DC | PRN
  Administered 2016-02-06: 12.5 mg via INTRAVENOUS

## 2016-02-06 SURGICAL SUPPLY — 25 items
BRIEF STRETCH MATERNITY 2XLG (MISCELLANEOUS) IMPLANT
CANISTER SUCT 1200ML W/VALVE (MISCELLANEOUS) ×3 IMPLANT
DRAPE LAPAROTOMY 100X77 ABD (DRAPES) ×3 IMPLANT
DRAPE LEGGINS SURG 28X43 STRL (DRAPES) IMPLANT
DRAPE UNDER BUTTOCK W/FLU (DRAPES) IMPLANT
DRSG GAUZE PETRO 6X36 STRIP ST (GAUZE/BANDAGES/DRESSINGS) IMPLANT
ELECT REM PT RETURN 9FT ADLT (ELECTROSURGICAL) ×3
ELECTRODE REM PT RTRN 9FT ADLT (ELECTROSURGICAL) ×1 IMPLANT
GLOVE BIO SURGEON STRL SZ7.5 (GLOVE) ×3 IMPLANT
GLOVE INDICATOR 8.0 STRL GRN (GLOVE) ×3 IMPLANT
GOWN STRL REUS W/ TWL LRG LVL3 (GOWN DISPOSABLE) ×2 IMPLANT
GOWN STRL REUS W/TWL LRG LVL3 (GOWN DISPOSABLE) ×4
LABEL OR SOLS (LABEL) IMPLANT
NDL SAFETY 22GX1.5 (NEEDLE) ×3 IMPLANT
NEEDLE HYPO 25X1 1.5 SAFETY (NEEDLE) ×3 IMPLANT
PACK BASIN MINOR ARMC (MISCELLANEOUS) ×3 IMPLANT
PAD OB MATERNITY 4.3X12.25 (PERSONAL CARE ITEMS) ×3 IMPLANT
PAD PREP 24X41 OB/GYN DISP (PERSONAL CARE ITEMS) IMPLANT
STRAP SAFETY BODY (MISCELLANEOUS) IMPLANT
SUCT SIGMOIDOSCOPE TIP 18 W/TU (SUCTIONS) ×3 IMPLANT
SURGILUBE 2OZ TUBE FLIPTOP (MISCELLANEOUS) ×3 IMPLANT
SUT SILK 0 CT 1 30 (SUTURE) ×3 IMPLANT
SUT VIC AB 3-0 SH 27 (SUTURE)
SUT VIC AB 3-0 SH 27X BRD (SUTURE) IMPLANT
SYR CONTROL 10ML (SYRINGE) ×3 IMPLANT

## 2016-02-06 NOTE — Discharge Instructions (Signed)

## 2016-02-06 NOTE — H&P (Signed)
Terry Horn [id number] 07/14/1960     HPI:  No change in clinical history or exam.   Prescriptions Prior to Admission  Medication Sig Dispense Refill Last Dose  . aspirin 81 MG tablet Take 81 mg by mouth daily.    02/05/2016 at Unknown time  . cetirizine-pseudoephedrine (ZYRTEC-D ALLERGY & CONGESTION) 5-120 MG tablet Take 1 tablet by mouth 2 (two) times daily.    02/05/2016 at Unknown time  . Cholecalciferol 1000 UNITS tablet Take 1,000 Units by mouth daily.    02/05/2016 at Unknown time  . losartan-hydrochlorothiazide (HYZAAR) 100-12.5 MG tablet TAKE 1 TABLET BY MOUTH DAILY (Patient taking differently: TAKE 1 TABLET BY MOUTH DAILY-HS) 30 tablet 12 02/05/2016 at Unknown time  . Misc Natural Products (GLUCOSAMINE CHONDROITIN COMPLX) TABS Take 1 tablet by mouth daily.    02/05/2016 at Unknown time  . Multiple Vitamin tablet Take 1 tablet by mouth daily.    02/05/2016 at Unknown time  . nabumetone (RELAFEN) 500 MG tablet TAKE 1 TABLET TWICE DAILY AS NEEDED FOR PAIN (Patient taking differently: TAKE 2 TABLETS ( 1000 mg) every morning for PAIN) 60 tablet 11 02/05/2016 at Unknown time  . OMEGA-3 FATTY ACIDS PO Take 1 tablet by mouth daily.    Past Week at Unknown time  . Turmeric, Curcuma Longa, POWD Take by mouth.    02/05/2016 at Unknown time  . vitamin C (ASCORBIC ACID) 500 MG tablet Take 500 mg by mouth daily.    02/05/2016 at Unknown time  . vitamin E 400 UNIT capsule Take 400 Units by mouth daily.    02/05/2016 at Unknown time   No Known Allergies Past Medical History:  Diagnosis Date  . Anxiety    just recently  . Arthritis   . Cancer ([place])    skin cancer removed  . GERD (gastroesophageal reflux disease)   . Hypertension   . Pneumonia   . Sleep apnea    with cpap   Past Surgical History:  Procedure Laterality Date  . TONSILLECTOMY     Social History   Social History  . Marital status: Married    Spouse name: N/A  . Number of children: N/A  . Years of education: N/A    Occupational History  . Not on file.   Social History Main Topics  . Smoking status: Former Smoker    Years: 22.00    Quit date: 09/05/1996  . Smokeless tobacco: Never Used     Comment: Quit in 1998  . Alcohol use Yes     Comment: occasionally  . Drug use: No  . Sexual activity: Not on file   Other Topics Concern  . Not on file   Social History Narrative  . No narrative on file   Social History   Social History Narrative  . No narrative on file     ROS: Negative.     PE: HEENT: Negative. Lungs: Clear. Cardio: RR. Terry Horn 02/06/2016   Assessment/Plan:  Proceed with planned endoscopy and excision of perianal mass.

## 2016-02-06 NOTE — Transfer of Care (Signed)
Immediate Anesthesia Transfer of Care Note  Patient: Rhyen Kellenberger.  Procedure(s) Performed: Procedure(s): EXCISION OF SKIN TAG (N/A)  Patient Location: PACU  Anesthesia Type:General  Level of Consciousness: awake and alert   Airway & Oxygen Therapy: Patient Spontanous Breathing and Patient connected to nasal cannula oxygen  Post-op Assessment: Report given to RN and Post -op Vital signs reviewed and stable  Post vital signs: Reviewed and stable  Last Vitals:  Vitals:   02/06/16 1223 02/06/16 1435  BP: (!) 162/71 121/70  Pulse: 81 68  Resp: 16 16  Temp: 36.9 C 36.2 C    Last Pain:  Vitals:   02/06/16 1223  TempSrc: Oral         Complications: No apparent anesthesia complications

## 2016-02-06 NOTE — Op Note (Signed)
Preoperative diagnosis: Large anal skin tag.  Postoperative diagnosis: Same.  Operative procedure: Excision of large anal skin tag.  Operating surgeon Ollen Bowl, M.D.  Anesthesia: Monitored anesthesia care, Marcaine 0.5% with 1-200,000 epinephrine, 30 mL.  Estimate blood loss: Less than 5 mL.  Clinical note: This 55 year old male underwent a colonoscopy with identification of a large cecal polyp which was removed endoscopically. He had a large external anal skin tag and excision was planned. Prior to beginning the colonoscopy Marcaine with epinephrine was infiltrated for hemostasis and postoperative analgesia. The area was cleansed with Betadine prior to instillation of local anesthetic and again prior to the procedure.  Operative note: The patient under adequate sedation the area was prepped with Betadine solution and draped. Through elliptical incision the large 6 cm long anal tag was excised extending to the edge of the anus. The defect expose the superficial anal sphincter. This was left intact. Hemostasis was with electrocautery. The wound was closed with a running 3-0 subcuticular suture. Bacitracin ointment was applied to the wound. Dry dressing placed. The patient was taken to recovery room stable condition.

## 2016-02-06 NOTE — Op Note (Signed)
[facility] Gastroenterology Patient Name: Terry Horn Procedure Date: 02/06/2016 12:51 PM MRN: [id number] Account #: [id number] Date of Birth: [birth year] Admit Type: Outpatient Age: 55 Room: OR 9 Gender: Male Note Status: Finalized Procedure:            Colonoscopy Indications:          Screening for colorectal malignant neoplasm Providers:            [name], MD Medicines:            Monitored Anesthesia Care Complications:        No immediate complications. Procedure:            Pre-Anesthesia Assessment:                       - Prior to the procedure, a History and Physical was                        performed, and patient medications, allergies and                        sensitivities were reviewed. The patient's tolerance of                        previous anesthesia was reviewed.                       - The risks and benefits of the procedure and the                        sedation options and risks were discussed with the                        patient. All questions were answered and informed                        consent was obtained.                       After obtaining informed consent, the colonoscope was                        passed under direct vision. Throughout the procedure,                        the patient's blood pressure, pulse, and oxygen                        saturations were monitored continuously. The                        Colonoscope was introduced through the anus and                        advanced to the the cecum, identified by appendiceal                        orifice and ileocecal valve. The colonoscopy was                        performed without difficulty. The patient tolerated the  procedure well. The quality of the bowel preparation                        was excellent. Findings:      A 30 mm polyp was found in the cecum. The polyp was sessile. Area was       successfully injected  with 6 mL saline for a lift polypectomy. The polyp       was removed in four major pieces with a hot snare at 25 watts. Resection       and retrieval were complete using a suction (via the working channel).       Two hemostatic clips were successfully placed (MR conditional). Impression:           - One 30 mm polyp in the cecum, removed with a hot                        snare. Resected and retrieved. Injected. Clips (MR                        conditional) were placed. Recommendation:       - Telephone endoscopist for pathology results in 1 week. Procedure Code(s):    --- Professional ---                       [phone], Colonoscopy, flexible; with removal of tumor(s),                        polyp(s), or other lesion(s) by snare technique                       [id number], Colonoscopy, flexible; with directed submucosal                        injection(s), any substance Diagnosis Code(s):    --- Professional ---                       Z12.11, Encounter for screening for malignant neoplasm                        of colon                       D12.0, Benign neoplasm of cecum CPT copyright 2016 American Medical Association. All rights reserved. The codes documented in this report are preliminary and upon coder review may  be revised to meet current compliance requirements. [name], MD 02/06/2016 3:12:15 PM This report has been signed electronically. Number of Addenda: 0 Note Initiated On: 02/06/2016 12:51 PM Scope Withdrawal Time: 0 hours 40 minutes 48 seconds  Total Procedure Duration: 0 hours 44 minutes 38 seconds       [facility]

## 2016-02-06 NOTE — Anesthesia Preprocedure Evaluation (Signed)
Anesthesia Evaluation  Patient identified by MRN, date of birth, ID band Patient awake    Reviewed: Allergy & Precautions, NPO status , Patient's Chart, lab work & pertinent test results  History of Anesthesia Complications Negative for: history of anesthetic complications  Airway Mallampati: III       Dental   Pulmonary neg pulmonary ROS, sleep apnea and Continuous Positive Airway Pressure Ventilation , former smoker,           Cardiovascular hypertension, Pt. on medications (-) Past MI and (-) CHF (-) dysrhythmias (-) Valvular Problems/Murmurs     Neuro/Psych Anxiety negative neurological ROS     GI/Hepatic Neg liver ROS, GERD  Controlled,  Endo/Other  negative endocrine ROS  Renal/GU negative Renal ROS     Musculoskeletal   Abdominal   Peds  Hematology   Anesthesia Other Findings   Reproductive/Obstetrics                            Anesthesia Physical Anesthesia Plan  ASA: III  Anesthesia Plan: General   Post-op Pain Management:    Induction: Intravenous  Airway Management Planned: Nasal Cannula  Additional Equipment:   Intra-op Plan:   Post-operative Plan:   Informed Consent: I have reviewed the patients History and Physical, chart, labs and discussed the procedure including the risks, benefits and alternatives for the proposed anesthesia with the patient or authorized representative who has indicated his/her understanding and acceptance.     Plan Discussed with:   Anesthesia Plan Comments:         Anesthesia Quick Evaluation

## 2016-02-06 NOTE — OR Nursing (Signed)
COLON SCOPE AT CECUM [phone]

## 2016-02-06 NOTE — Anesthesia Procedure Notes (Signed)
Performed by: Anelly Samarin       

## 2016-02-06 NOTE — OR Nursing (Signed)
COLON SCOPE IN AFTER ANESTHESIA AT 1334 BY DR Bary Castilla

## 2016-02-06 NOTE — Anesthesia Postprocedure Evaluation (Signed)
Anesthesia Post Note  Patient: Terry Horn.  Procedure(s) Performed: Procedure(s) (LRB): EXCISION OF SKIN TAG (N/A)  Patient location during evaluation: PACU Anesthesia Type: General Level of consciousness: awake and alert Pain management: pain level controlled Vital Signs Assessment: post-procedure vital signs reviewed and stable Respiratory status: spontaneous breathing and respiratory function stable Cardiovascular status: stable Anesthetic complications: no    Last Vitals:  Vitals:   02/06/16 1223 02/06/16 1435  BP: (!) 162/71 121/70  Pulse: 81 68  Resp: 16 16  Temp: 36.9 C 36.2 C    Last Pain:  Vitals:   02/06/16 1435  TempSrc:   PainSc: Asleep                 Queena Monrreal K

## 2016-02-06 NOTE — OR Nursing (Signed)
COLON SCOPE OUT AT [id number]. PT S/P CECUM POLYP HOT SNARE WITH 2 CLIPS

## 2016-02-06 NOTE — Progress Notes (Signed)
Dr. Into see 

## 2016-02-07 ENCOUNTER — Encounter: Payer: Self-pay | Admitting: General Surgery

## 2016-02-10 LAB — SURGICAL PATHOLOGY

## 2016-02-17 ENCOUNTER — Encounter: Payer: Self-pay | Admitting: General Surgery

## 2016-02-17 ENCOUNTER — Ambulatory Visit (INDEPENDENT_AMBULATORY_CARE_PROVIDER_SITE_OTHER): Payer: BC Managed Care – PPO | Admitting: General Surgery

## 2016-02-17 VITALS — BP 158/80 | HR 78 | Resp 14 | Ht 71.0 in | Wt 337.0 lb

## 2016-02-17 DIAGNOSIS — Z86010 Personal history of colonic polyps: Secondary | ICD-10-CM

## 2016-02-17 DIAGNOSIS — Z1211 Encounter for screening for malignant neoplasm of colon: Secondary | ICD-10-CM

## 2016-02-17 NOTE — Progress Notes (Signed)
Patient ID: [name]., male   DOB: 25-Jul-1960, 55 y.o.   MRN: [id number]  Chief Complaint  Patient presents with  . Routine Post Op    HPI Mcguire Dresel. is a 55 y.o. male here today fir his follow up skin tag removal done on 02/06/2016.  HPI  Past Medical History:  Diagnosis Date  . Anxiety    just recently  . Arthritis   . Cancer ([place])    skin cancer removed  . GERD (gastroesophageal reflux disease)   . Hypertension   . Pneumonia   . Sleep apnea    with cpap    Past Surgical History:  Procedure Laterality Date  . EXCISION OF SKIN TAG N/A 02/06/2016   Procedure: EXCISION OF SKIN TAG;  Surgeon: [name], MD;  Location: [facility] ORS;  Service: General;  Laterality: N/A;  . TONSILLECTOMY      Family History  Problem Relation Age of Onset  . Bipolar disorder Mother   . Thyroid disease Mother   . Depression Sister   . Prostate cancer Paternal Grandfather   . Prostate cancer Paternal Uncle     Social History Social History  Substance Use Topics  . Smoking status: Former Smoker    Years: 22.00    Quit date: 09/05/1996  . Smokeless tobacco: Never Used     Comment: Quit in 1998  . Alcohol use Yes     Comment: occasionally    No Known Allergies  Current Outpatient Prescriptions  Medication Sig Dispense Refill  . aspirin 81 MG tablet Take 81 mg by mouth daily.     . cetirizine-pseudoephedrine (ZYRTEC-D ALLERGY & CONGESTION) 5-120 MG tablet Take 1 tablet by mouth 2 (two) times daily.     . Cholecalciferol 1000 UNITS tablet Take 1,000 Units by mouth daily.     Marland Kitchen HYDROcodone-acetaminophen (NORCO) 5-325 MG tablet Take 1-2 tablets by mouth every 4 (four) hours as needed for moderate pain. 30 tablet 0  . losartan-hydrochlorothiazide (HYZAAR) 100-12.5 MG tablet TAKE 1 TABLET BY MOUTH DAILY (Patient taking differently: TAKE 1 TABLET BY MOUTH DAILY-HS) 30 tablet 12  . Misc Natural Products (GLUCOSAMINE CHONDROITIN COMPLX) TABS Take 1 tablet by mouth daily.      . Multiple Vitamin tablet Take 1 tablet by mouth daily.     . nabumetone (RELAFEN) 500 MG tablet TAKE 1 TABLET TWICE DAILY AS NEEDED FOR PAIN (Patient taking differently: TAKE 2 TABLETS ( 1000 mg) every morning for PAIN) 60 tablet 11  . OMEGA-3 FATTY ACIDS PO Take 1 tablet by mouth daily.     . Turmeric, Curcuma Longa, POWD Take by mouth.     . vitamin C (ASCORBIC ACID) 500 MG tablet Take 500 mg by mouth daily.     . vitamin E 400 UNIT capsule Take 400 Units by mouth daily.      No current facility-administered medications for this visit.     Review of Systems Review of Systems  Constitutional: Negative.   Respiratory: Negative.   Cardiovascular: Negative.     Blood pressure (!) 158/80, pulse 78, resp. rate 14, height 5\' 11"  (1.803 m), weight (!) 337 lb (152.9 kg).  Physical Exam Physical Exam  Constitutional: He is oriented to person, place, and time. He appears well-developed and well-nourished.  Eyes: Conjunctivae are normal. No scleral icterus.  Neck: Neck supple.  Cardiovascular: Normal rate, regular rhythm and normal heart sounds.   Pulmonary/Chest: Effort normal and breath sounds normal.  Genitourinary:  Lymphadenopathy:    He has no cervical adenopathy.  Neurological: He is alert and oriented to person, place, and time.  Skin: Skin is warm.    Data Reviewed DIAGNOSIS:  A. ANAL SKIN TAG; EXCISION:  - FIBROEPITHELIAL POLYP WITH FOCAL ABSCESS.  - NEGATIVE FOR DYSPLASIA AND MALIGNANCY.   B. COLON POLYP, CECUM; HOT SNARE:  - TUBULOVILLOUS ADENOMA.  - NEGATIVE FOR HIGH-GRADE DYSPLASIA AND MALIGNANCY.   Assessment    Doing well status post excision large external anal skin tag.  Tubulovillous polyp of the cecum, completely resected.    Plan    Indications for follow-up colonoscopy in 3 years reviewed.  The incision along the perineum should go one to heal without incident.    This information has been scribed by Gaspar Cola CMA.   [name] 02/17/2016, 12:22 PM

## 2016-02-20 ENCOUNTER — Ambulatory Visit (INDEPENDENT_AMBULATORY_CARE_PROVIDER_SITE_OTHER): Payer: BC Managed Care – PPO | Admitting: Family Medicine

## 2016-02-20 VITALS — BP 118/74 | HR 62 | Temp 98.2°F | Resp 16 | Wt 337.0 lb

## 2016-02-20 DIAGNOSIS — M4302 Spondylolysis, cervical region: Secondary | ICD-10-CM

## 2016-02-20 DIAGNOSIS — M5442 Lumbago with sciatica, left side: Secondary | ICD-10-CM

## 2016-02-20 DIAGNOSIS — G4733 Obstructive sleep apnea (adult) (pediatric): Secondary | ICD-10-CM

## 2016-02-20 DIAGNOSIS — R739 Hyperglycemia, unspecified: Secondary | ICD-10-CM | POA: Diagnosis not present

## 2016-02-20 DIAGNOSIS — G8929 Other chronic pain: Secondary | ICD-10-CM | POA: Diagnosis not present

## 2016-02-20 DIAGNOSIS — M545 Low back pain: Secondary | ICD-10-CM | POA: Diagnosis not present

## 2016-02-20 DIAGNOSIS — M47816 Spondylosis without myelopathy or radiculopathy, lumbar region: Secondary | ICD-10-CM | POA: Diagnosis not present

## 2016-02-20 DIAGNOSIS — Z6841 Body Mass Index (BMI) 40.0 and over, adult: Secondary | ICD-10-CM

## 2016-02-20 DIAGNOSIS — M5136 Other intervertebral disc degeneration, lumbar region: Secondary | ICD-10-CM | POA: Insufficient documentation

## 2016-02-20 DIAGNOSIS — E782 Mixed hyperlipidemia: Secondary | ICD-10-CM | POA: Diagnosis not present

## 2016-02-20 DIAGNOSIS — M5030 Other cervical disc degeneration, unspecified cervical region: Secondary | ICD-10-CM

## 2016-02-20 DIAGNOSIS — M51369 Other intervertebral disc degeneration, lumbar region without mention of lumbar back pain or lower extremity pain: Secondary | ICD-10-CM | POA: Insufficient documentation

## 2016-02-20 DIAGNOSIS — I10 Essential (primary) hypertension: Secondary | ICD-10-CM

## 2016-02-20 NOTE — Progress Notes (Signed)
Terry Horn.  MRN: [id number] DOB: 1960-04-30  Subjective:  HPI  Patient is here for follow up from August visit. He is not checking his b/p. No cardiac symptoms present. BP Readings from Last 3 Encounters:  02/20/16 118/74  02/17/16 (!) 158/80  02/06/16 121/63   Back pain is still present but he did see a specialist last week and received an injection to hopefully help with pain. He is using auto titrating CPAP machine and is getting use to it.  Wt Readings from Last 3 Encounters:  02/20/16 (!) 337 lb (152.9 kg)  02/17/16 (!) 337 lb (152.9 kg)  01/21/16 (!) 337 lb (152.9 kg)   Last labs BMP 09/06/15 and the rest were done on 01/08/15 Patient Active Problem List   Diagnosis Date Noted  . Hyperglycemia 02/20/2016  . Chronic bilateral low back pain 02/20/2016  . DDD (degenerative disc disease), lumbar 02/20/2016  . Lumbar spondylosis 02/20/2016  . DDD (degenerative disc disease), cervical 02/20/2016  . Cervical spondylolysis 02/20/2016  . History of colonic polyps 02/06/2016  . Encounter for screening colonoscopy 01/21/2016  . Mixed hyperlipidemia 10/01/2015  . Chest pressure 07/26/2015  . Paresthesia 07/26/2015  . Arthritis 01/02/2015  . Body mass index (BMI) of 50-59.9 in adult ([place]) 01/02/2015  . Essential (primary) hypertension 01/02/2015  . Adiposity 01/02/2015  . Apnea, sleep 01/02/2015  . Avitaminosis D 01/02/2015    Past Medical History:  Diagnosis Date  . Anxiety    just recently  . Arthritis   . Cancer ([place])    skin cancer removed  . GERD (gastroesophageal reflux disease)   . Hypertension   . Pneumonia   . Sleep apnea    with cpap    Social History   Social History  . Marital status: Married    Spouse name: N/A  . Number of children: N/A  . Years of education: N/A   Occupational History  . Not on file.   Social History Main Topics  . Smoking status: Former Smoker    Years: 22.00    Quit date: 09/05/1996  . Smokeless tobacco: Never  Used     Comment: Quit in 1998  . Alcohol use Yes     Comment: occasionally  . Drug use: No  . Sexual activity: Not on file   Other Topics Concern  . Not on file   Social History Narrative  . No narrative on file    Outpatient Encounter Prescriptions as of 02/20/2016  Medication Sig  . cetirizine-pseudoephedrine (ZYRTEC-D ALLERGY & CONGESTION) 5-120 MG tablet Take 1 tablet by mouth 2 (two) times daily.   . Cholecalciferol 1000 UNITS tablet Take 1,000 Units by mouth daily.   Marland Kitchen losartan-hydrochlorothiazide (HYZAAR) 100-12.5 MG tablet TAKE 1 TABLET BY MOUTH DAILY (Patient taking differently: TAKE 1 TABLET BY MOUTH DAILY-HS)  . Misc Natural Products (GLUCOSAMINE CHONDROITIN COMPLX) TABS Take 1 tablet by mouth daily.   . Multiple Vitamin tablet Take 1 tablet by mouth daily.   . nabumetone (RELAFEN) 500 MG tablet TAKE 1 TABLET TWICE DAILY AS NEEDED FOR PAIN (Patient taking differently: TAKE 2 TABLETS ( 1000 mg) every morning for PAIN)  . OMEGA-3 FATTY ACIDS PO Take 1 tablet by mouth daily.   . Turmeric, Curcuma Longa, POWD Take by mouth.   . vitamin C (ASCORBIC ACID) 500 MG tablet Take 500 mg by mouth daily.   . vitamin E 400 UNIT capsule Take 400 Units by mouth daily.   Marland Kitchen aspirin 81 MG tablet  Take 81 mg by mouth daily.   . [DISCONTINUED] HYDROcodone-acetaminophen (NORCO) 5-325 MG tablet Take 1-2 tablets by mouth every 4 (four) hours as needed for moderate pain.   No facility-administered encounter medications on file as of 02/20/2016.     No Known Allergies  Review of Systems  Constitutional: Negative.   Respiratory: Negative.   Cardiovascular: Negative.   Gastrointestinal: Negative.   Musculoskeletal: Positive for back pain and joint pain.  Neurological: Negative.   Psychiatric/Behavioral: The patient is nervous/anxious.        Agitated more the past year    Objective:  BP 118/74   Pulse 62   Temp 98.2 F (36.8 C)   Resp 16   Wt (!) 337 lb (152.9 kg)   BMI 47.00 kg/m     Physical Exam  Constitutional: He is oriented to person, place, and time and well-developed, well-nourished, and in no distress.  Morbid obesity--pear shaped body habitus  HENT:  Head: Normocephalic and atraumatic.  Eyes: Conjunctivae are normal. Pupils are equal, round, and reactive to light.  Neck: Normal range of motion. Neck supple.  Cardiovascular: Normal rate, regular rhythm, normal heart sounds and intact distal pulses.   No murmur heard. Pulmonary/Chest: Effort normal and breath sounds normal. No respiratory distress. He has no wheezes.  Musculoskeletal: He exhibits no tenderness.  Neurological: He is alert and oriented to person, place, and time.  Skin: Skin is warm and dry.  Psychiatric: Mood, memory, affect and judgment normal.    Assessment and Plan :  1. Essential (primary) hypertension Stable. Follow. Continue current medication. Patient already received Flu shot through pharmacy/through work. - CBC w/Diff/Platelet - TSH  2. Obstructive sleep apnea syndrome Patient uses CPAP every night. Tolerating it well and is getting use to it. Using autotitrating CPAP since May 2017. 3. Mixed hyperlipidemia Check labs. - Lipid Panel With LDL/HDL Ratio - Comprehensive metabolic panel  4. BMI 45.0-49.9, adult (HCC) Weights stable. Continue working on habits. - TSH  5. Hyperglycemia Last A1C was 5.9 in may 2017, re check today and address as needed pending results. - HgB A1c  6. Chronic bilateral low back pain, with sciatica presence unspecified/LS Radiculopathy Sees specialist. Follow as needed. 7.Morbid Obesity More than 50% of more than 30 minute visit is spent in counselling. 8.Chronic Anxiety HPI, Exam and A&P transcribed under direction and in the presence of Miguel Aschoff, MD. I have done the exam and reviewed the chart and it is accurate to the best of my knowledge. Development worker, community has been used and  any errors in dictation or transcription are  unintentional. Miguel Aschoff M.D. [facility]

## 2016-02-22 LAB — COMPREHENSIVE METABOLIC PANEL
A/G RATIO: 2.1 (ref 1.2–2.2)
ALK PHOS: 72 IU/L (ref 39–117)
ALT: 16 IU/L (ref 0–44)
AST: 18 IU/L (ref 0–40)
Albumin: 4.2 g/dL (ref 3.5–5.5)
BILIRUBIN TOTAL: 0.3 mg/dL (ref 0.0–1.2)
BUN / CREAT RATIO: 16 (ref 9–20)
BUN: 11 mg/dL (ref 6–24)
CO2: 26 mmol/L (ref 18–29)
Calcium: 9.2 mg/dL (ref 8.7–10.2)
Chloride: 97 mmol/L (ref 96–106)
Creatinine, Ser: 0.7 mg/dL — ABNORMAL LOW (ref 0.76–1.27)
GFR calc Af Amer: 123 mL/min/{1.73_m2} (ref >59)
GFR calc non Af Amer: 106 mL/min/{1.73_m2} (ref >59)
GLOBULIN, TOTAL: 2 g/dL (ref 1.5–4.5)
Glucose: 96 mg/dL (ref 65–99)
POTASSIUM: 4.4 mmol/L (ref 3.5–5.2)
SODIUM: 139 mmol/L (ref 134–144)
Total Protein: 6.2 g/dL (ref 6.0–8.5)

## 2016-02-22 LAB — LIPID PANEL WITH LDL/HDL RATIO
Cholesterol, Total: 165 mg/dL (ref 100–199)
HDL: 52 mg/dL (ref >39)
LDL CALC: 98 mg/dL (ref 0–99)
LDL/HDL RATIO: 1.9 ratio (ref 0.0–3.6)
Triglycerides: 76 mg/dL (ref 0–149)
VLDL CHOLESTEROL CAL: 15 mg/dL (ref 5–40)

## 2016-02-22 LAB — CBC WITH DIFFERENTIAL/PLATELET
BASOS: 1 %
Basophils Absolute: 0 10*3/uL (ref 0.0–0.2)
EOS (ABSOLUTE): 0.1 10*3/uL (ref 0.0–0.4)
Eos: 2 %
HEMATOCRIT: 38.7 % (ref 37.5–51.0)
Hemoglobin: 12.6 g/dL — ABNORMAL LOW (ref 13.0–17.7)
IMMATURE GRANS (ABS): 0 10*3/uL (ref 0.0–0.1)
Immature Granulocytes: 0 %
LYMPHS: 31 %
Lymphocytes Absolute: 2 10*3/uL (ref 0.7–3.1)
MCH: 27.1 pg (ref 26.6–33.0)
MCHC: 32.6 g/dL (ref 31.5–35.7)
MCV: 83 fL (ref 79–97)
Monocytes Absolute: 0.6 10*3/uL (ref 0.1–0.9)
Monocytes: 8 %
NEUTROS ABS: 3.7 10*3/uL (ref 1.4–7.0)
Neutrophils: 58 %
PLATELETS: 255 10*3/uL (ref 150–379)
RBC: 4.65 x10E6/uL (ref 4.14–5.80)
RDW: 14.9 % (ref 12.3–15.4)
WBC: 6.5 10*3/uL (ref 3.4–10.8)

## 2016-02-22 LAB — HEMOGLOBIN A1C
Est. average glucose Bld gHb Est-mCnc: 111 mg/dL
HEMOGLOBIN A1C: 5.5 % (ref 4.8–5.6)

## 2016-02-22 LAB — TSH: TSH: 1.18 u[IU]/mL (ref 0.450–4.500)

## 2016-02-26 ENCOUNTER — Telehealth: Payer: Self-pay

## 2016-02-26 NOTE — Telephone Encounter (Signed)
-----   Message from [place] sent at 02/25/2016  9:47 AM EST ----- [facility]  ED

## 2016-02-26 NOTE — Telephone Encounter (Signed)
Patient advised as below.  

## 2016-04-09 ENCOUNTER — Encounter: Payer: BC Managed Care – PPO | Admitting: Family Medicine

## 2016-04-14 ENCOUNTER — Encounter: Payer: Self-pay | Admitting: Family Medicine

## 2016-04-14 ENCOUNTER — Ambulatory Visit (INDEPENDENT_AMBULATORY_CARE_PROVIDER_SITE_OTHER): Payer: BC Managed Care – PPO | Admitting: Family Medicine

## 2016-04-14 VITALS — BP 122/78 | HR 66 | Temp 98.8°F | Resp 16 | Ht 71.0 in | Wt 333.0 lb

## 2016-04-14 DIAGNOSIS — Z125 Encounter for screening for malignant neoplasm of prostate: Secondary | ICD-10-CM | POA: Diagnosis not present

## 2016-04-14 DIAGNOSIS — Z0000 Encounter for general adult medical examination without abnormal findings: Secondary | ICD-10-CM

## 2016-04-14 LAB — POCT URINALYSIS DIPSTICK
BILIRUBIN UA: NEGATIVE
GLUCOSE UA: NEGATIVE
KETONES UA: NEGATIVE
Leukocytes, UA: NEGATIVE
Nitrite, UA: NEGATIVE
PH UA: 7.5
Protein, UA: NEGATIVE
RBC UA: NEGATIVE
Spec Grav, UA: 1.01
Urobilinogen, UA: 0.2

## 2016-04-14 NOTE — Progress Notes (Signed)
Patient: Terry Tetteh., Male    DOB: 04-21-1960, 57 y.o.   MRN: [id number] Visit Date: 04/14/2016  Today's Provider: Wilhemena Durie, MD   Chief Complaint  Patient presents with  . Annual Exam   Subjective:    Annual physical exam Terry Horn. is a 56 y.o. male who presents today for health maintenance and complete physical. He feels well. He reports exercising 2-3 times a week. He reports he is sleeping fairly well. Stress level is high recently his mother's had a fall and has had a craniotomy for a cerebral bleed. Father is not doing well with his mother's illness. Mother is in  hospital presently. He just had his back injected but is not sure if it is helped with his chronic low back pain.  ----------------------------------------------------------------- Colonoscopy- 02/06/16 Tubulovillous adenoma repeat 3 years  Review of Systems  Constitutional: Negative.   HENT: Negative.   Eyes: Negative.   Respiratory: Negative.   Cardiovascular: Negative.   Gastrointestinal: Negative.   Endocrine: Negative.   Genitourinary: Negative.   Musculoskeletal: Positive for back pain.  Skin: Negative.   Allergic/Immunologic: Negative.   Neurological: Negative.   Hematological: Negative.   Psychiatric/Behavioral: Negative.     Social History      He  reports that he quit smoking about 19 years ago. He quit after 22.00 years of use. He has never used smokeless tobacco. He reports that he drinks alcohol. He reports that he does not use drugs.       Social History   Social History  . Marital status: Married    Spouse name: N/A  . Number of children: N/A  . Years of education: N/A   Social History Main Topics  . Smoking status: Former Smoker    Years: 22.00    Quit date: 09/05/1996  . Smokeless tobacco: Never Used     Comment: Quit in 1998  . Alcohol use Yes     Comment: occasionally  . Drug use: No  . Sexual activity: Not Asked   Other Topics Concern  .  None   Social History Narrative  . None    Past Medical History:  Diagnosis Date  . Anxiety    just recently  . Arthritis   . Cancer ([place])    skin cancer removed  . GERD (gastroesophageal reflux disease)   . Hypertension   . Pneumonia   . Sleep apnea    with cpap     Patient Active Problem List   Diagnosis Date Noted  . Hyperglycemia 02/20/2016  . Chronic bilateral low back pain 02/20/2016  . DDD (degenerative disc disease), lumbar 02/20/2016  . Lumbar spondylosis 02/20/2016  . DDD (degenerative disc disease), cervical 02/20/2016  . Cervical spondylolysis 02/20/2016  . History of colonic polyps 02/06/2016  . Encounter for screening colonoscopy 01/21/2016  . Mixed hyperlipidemia 10/01/2015  . Chest pressure 07/26/2015  . Paresthesia 07/26/2015  . Arthritis 01/02/2015  . Body mass index (BMI) of 50-59.9 in adult ([place]) 01/02/2015  . Essential (primary) hypertension 01/02/2015  . Adiposity 01/02/2015  . Apnea, sleep 01/02/2015  . Avitaminosis D 01/02/2015    Past Surgical History:  Procedure Laterality Date  . EXCISION OF SKIN TAG N/A 02/06/2016   Procedure: EXCISION OF SKIN TAG;  Surgeon: [name], MD;  Location: [facility] ORS;  Service: General;  Laterality: N/A;  . TONSILLECTOMY      Family History  Family Status  Relation Status  . Mother Alive  . Sister Alive  . Paternal Grandfather Deceased  . Father Alive  . Son Alive  . Son Alive  . Paternal Uncle         His family history includes Bipolar disorder in his mother; Depression in his sister; Prostate cancer in his paternal grandfather and paternal uncle; Thyroid disease in his mother.     No Known Allergies   Current Outpatient Prescriptions:  .  aspirin 81 MG tablet, Take 81 mg by mouth daily. , Disp: , Rfl:  .  cetirizine-pseudoephedrine (ZYRTEC-D ALLERGY & CONGESTION) 5-120 MG tablet, Take 1 tablet by mouth 2 (two) times daily. , Disp: , Rfl:  .  Cholecalciferol 1000 UNITS tablet,  Take 1,000 Units by mouth daily. , Disp: , Rfl:  .  losartan-hydrochlorothiazide (HYZAAR) 100-12.5 MG tablet, TAKE 1 TABLET BY MOUTH DAILY (Patient taking differently: TAKE 1 TABLET BY MOUTH DAILY-HS), Disp: 30 tablet, Rfl: 12 .  Misc Natural Products (GLUCOSAMINE CHONDROITIN COMPLX) TABS, Take 1 tablet by mouth daily. , Disp: , Rfl:  .  Multiple Vitamin tablet, Take 1 tablet by mouth daily. , Disp: , Rfl:  .  OMEGA-3 FATTY ACIDS PO, Take 1 tablet by mouth daily. , Disp: , Rfl:  .  triamcinolone cream (KENALOG) 0.1 %, , Disp: , Rfl:  .  Turmeric, Curcuma Longa, POWD, Take by mouth. , Disp: , Rfl:  .  vitamin C (ASCORBIC ACID) 500 MG tablet, Take 500 mg by mouth daily. , Disp: , Rfl:  .  vitamin E 400 UNIT capsule, Take 400 Units by mouth daily. , Disp: , Rfl:  .  nabumetone (RELAFEN) 500 MG tablet, TAKE 1 TABLET TWICE DAILY AS NEEDED FOR PAIN (Patient not taking: Reported on 04/14/2016), Disp: 60 tablet, Rfl: 11   Patient Care Team: Jerrol Banana., MD as PCP - General (Family Medicine) Jerrol Banana., MD (Family Medicine) [name], MD (General Surgery)      Objective:   Vitals: BP 122/78 (BP Location: Left Arm, Patient Position: Sitting, Cuff Size: Large)   Pulse 66   Temp 98.8 F (37.1 C) (Oral)   Resp 16   Ht 5\' 11"  (1.803 m)   Wt (!) 333 lb (151 kg)   BMI 46.44 kg/m    Physical Exam  Constitutional: He is oriented to person, place, and time. He appears well-developed and well-nourished.  Pleasant, cooperative, morbidly obese,pear  shaped white male in no acute distress.  HENT:  Head: Normocephalic and atraumatic.  Right Ear: External ear normal.  Left Ear: External ear normal.  Nose: Nose normal.  Mouth/Throat: Oropharynx is clear and moist.  Eyes: Conjunctivae and EOM are normal. Pupils are equal, round, and reactive to light.  Neck: Normal range of motion. Neck supple.  Cardiovascular: Normal rate, regular rhythm, normal heart sounds and intact  distal pulses.   Pulmonary/Chest: Effort normal and breath sounds normal.  Abdominal: Soft. Bowel sounds are normal.  Musculoskeletal: Normal range of motion.  Neurological: He is alert and oriented to person, place, and time. He has normal reflexes.  Skin: Skin is warm and dry.  Psychiatric: He has a normal mood and affect. His behavior is normal. Judgment and thought content normal.     Depression Screen PHQ 2/9 Scores 04/14/2016 09/23/2015 01/08/2015  PHQ - 2 Score 0 0 0      Assessment & Plan:     Routine Health Maintenance and Physical Exam  Exercise Activities and Dietary recommendations Goals    None      Immunization History  Administered Date(s) Administered  . Pneumococcal Polysaccharide-23 12/21/1991  . Tdap 10/26/2007    Health Maintenance  Topic Date Due  . Hepatitis C Screening  05-Jan-1961  . HIV Screening  09/14/1975  . TETANUS/TDAP  10/25/2017  . COLONOSCOPY  02/05/2026  . INFLUENZA VACCINE  Addressed    DRE done at time of colonoscopy 2 months ago. Patient has lost 6 pounds since his last visit. Diet and exercise encouraged.Colonoscopy 2 months ago. Discussed health benefits of physical activity, and encouraged him to engage in regular exercise appropriate for his age and condition.  LS and Cervial DDD/Chronic LBP--per Dr Nelva Bush. Dermatology per Dr. Nehemiah Massed. Return to clinic 6 months.   --------------------------------------------------------------------   I have done the exam and reviewed the above chart and it is accurate to the best of my knowledge. Development worker, community has been used in this note in any air is in the dictation or transcription are unintentional.  Wilhemena Durie, MD  [place]

## 2016-04-15 ENCOUNTER — Telehealth: Payer: Self-pay

## 2016-04-15 LAB — PSA: PROSTATE SPECIFIC AG, SERUM: 0.2 ng/mL (ref 0.0–4.0)

## 2016-04-15 NOTE — Telephone Encounter (Signed)
-----   Message from Jerrol Banana., MD sent at 04/15/2016  8:48 AM EST ----- PSA normal.

## 2016-04-15 NOTE — Telephone Encounter (Signed)
Left message to call back  

## 2016-04-21 NOTE — Telephone Encounter (Signed)
Left message to call back  

## 2016-04-24 NOTE — Telephone Encounter (Signed)
Unable to contact patient.

## 2016-04-29 ENCOUNTER — Encounter: Payer: Self-pay | Admitting: Family Medicine

## 2016-05-20 ENCOUNTER — Encounter: Payer: Self-pay | Admitting: Family Medicine

## 2016-10-08 ENCOUNTER — Other Ambulatory Visit: Payer: Self-pay

## 2016-10-08 DIAGNOSIS — E782 Mixed hyperlipidemia: Secondary | ICD-10-CM

## 2016-10-13 ENCOUNTER — Encounter: Payer: Self-pay | Admitting: Family Medicine

## 2016-10-13 ENCOUNTER — Ambulatory Visit (INDEPENDENT_AMBULATORY_CARE_PROVIDER_SITE_OTHER): Payer: BC Managed Care – PPO | Admitting: Family Medicine

## 2016-10-13 VITALS — BP 116/68 | HR 74 | Temp 97.4°F | Resp 16 | Wt 336.0 lb

## 2016-10-13 DIAGNOSIS — G4733 Obstructive sleep apnea (adult) (pediatric): Secondary | ICD-10-CM | POA: Diagnosis not present

## 2016-10-13 DIAGNOSIS — Z6841 Body Mass Index (BMI) 40.0 and over, adult: Secondary | ICD-10-CM | POA: Diagnosis not present

## 2016-10-13 DIAGNOSIS — N401 Enlarged prostate with lower urinary tract symptoms: Secondary | ICD-10-CM

## 2016-10-13 DIAGNOSIS — I10 Essential (primary) hypertension: Secondary | ICD-10-CM | POA: Diagnosis not present

## 2016-10-13 DIAGNOSIS — G8929 Other chronic pain: Secondary | ICD-10-CM

## 2016-10-13 DIAGNOSIS — R350 Frequency of micturition: Secondary | ICD-10-CM | POA: Diagnosis not present

## 2016-10-13 DIAGNOSIS — M545 Low back pain: Secondary | ICD-10-CM

## 2016-10-13 LAB — POCT URINALYSIS DIPSTICK
Bilirubin, UA: NEGATIVE
Glucose, UA: NEGATIVE
Ketones, UA: NEGATIVE
Leukocytes, UA: NEGATIVE
Nitrite, UA: NEGATIVE
PH UA: 7 (ref 5.0–8.0)
PROTEIN UA: NEGATIVE
RBC UA: NEGATIVE
Spec Grav, UA: 1.01 (ref 1.010–1.025)
UROBILINOGEN UA: 0.2 U/dL

## 2016-10-13 MED ORDER — TAMSULOSIN HCL 0.4 MG PO CAPS: 0.4000 mg | ORAL_CAPSULE | Freq: Every day | ORAL | 11 refills | Status: DC

## 2016-10-13 NOTE — Progress Notes (Signed)
Subjective:  HPI  Hypertension, follow-up:  BP Readings from Last 3 Encounters:  10/13/16 116/68  04/14/16 122/78  02/20/16 118/74    He was last seen for hypertension 6 months ago.  BP at that visit was 122/78. Management since that visit includes none. He reports good compliance with treatment. He is not having side effects.  He is exercising 3 days a week. He is adherent to low salt diet.   Outside blood pressures are not being checked. Patient denies chest pain, chest pressure/discomfort, claudication, dyspnea, exertional chest pressure/discomfort, fatigue, irregular heart beat, lower extremity edema, near-syncope, orthopnea, palpitations, paroxysmal nocturnal dyspnea, syncope and tachypnea.   Cardiovascular risk factors include advanced age (older than 35 for men, 68 for women), hypertension, male gender, obesity (BMI >= 30 kg/m2) and smoking/ tobacco exposure.    Wt Readings from Last 3 Encounters:  10/13/16 (!) 336 lb (152.4 kg)  04/14/16 (!) 333 lb (151 kg)  02/20/16 (!) 337 lb (152.9 kg)   ------------------------------------------------------------------------ Pt reports that he is getting up in the middle of the night more to urinate than he used to. He reports that he used to get up once a night but now it seems like he is getting up twice. He reports that he started noticing it around April/May. He reports that he has some retention and his urine stream is weaker. No other signs of UTI.   Prior to Admission medications   Medication Sig Start Date End Date Taking? Authorizing Provider  aspirin 81 MG tablet Take 81 mg by mouth daily.  04/08/10   [provider]  cetirizine-pseudoephedrine (ZYRTEC-D ALLERGY & CONGESTION) 5-120 MG tablet Take 1 tablet by mouth 2 (two) times daily.  10/07/10   [provider]  Cholecalciferol 1000 UNITS tablet Take 1,000 Units by mouth daily.  04/08/10   [provider]  losartan-hydrochlorothiazide (HYZAAR)  100-12.5 MG tablet TAKE 1 TABLET BY MOUTH DAILY Patient taking differently: TAKE 1 TABLET BY MOUTH DAILY-HS 10/28/15   Jerrol Banana., MD  Misc Natural Products (GLUCOSAMINE CHONDROITIN COMPLX) TABS Take 1 tablet by mouth daily.     [provider]  Multiple Vitamin tablet Take 1 tablet by mouth daily.  04/08/10   [provider]  nabumetone (RELAFEN) 500 MG tablet TAKE 1 TABLET TWICE DAILY AS NEEDED FOR PAIN Patient not taking: Reported on 04/14/2016 07/02/15   Jerrol Banana., MD  OMEGA-3 FATTY ACIDS PO Take 1 tablet by mouth daily.  04/08/10   [provider]  triamcinolone cream (KENALOG) 0.1 %  04/09/16   [provider]  Turmeric, Lear Ng, POWD Take by mouth.     [provider]  vitamin C (ASCORBIC ACID) 500 MG tablet Take 500 mg by mouth daily.  04/08/10   [provider]  vitamin E 400 UNIT capsule Take 400 Units by mouth daily.  04/08/10   [provider]    Patient Active Problem List   Diagnosis Date Noted  . Hyperglycemia 02/20/2016  . Chronic bilateral low back pain 02/20/2016  . DDD (degenerative disc disease), lumbar 02/20/2016  . Lumbar spondylosis 02/20/2016  . DDD (degenerative disc disease), cervical 02/20/2016  . Cervical spondylolysis 02/20/2016  . History of colonic polyps 02/06/2016  . Encounter for screening colonoscopy 01/21/2016  . Mixed hyperlipidemia 10/01/2015  . Chest pressure 07/26/2015  . Paresthesia 07/26/2015  . Arthritis 01/02/2015  . Body mass index (BMI) of 50-59.9 in adult ([place]) 01/02/2015  . Essential (primary)  hypertension 01/02/2015  . Adiposity 01/02/2015  . Apnea, sleep 01/02/2015  . Avitaminosis D 01/02/2015    Past Medical History:  Diagnosis Date  . Anxiety    just recently  . Arthritis   . Cancer ([place])    skin cancer removed  . GERD (gastroesophageal reflux disease)   . Hypertension   . Pneumonia   . Sleep apnea    with cpap    Social History   Social  History  . Marital status: Married    Spouse name: N/A  . Number of children: N/A  . Years of education: N/A   Occupational History  . Not on file.   Social History Main Topics  . Smoking status: Former Smoker    Years: 22.00    Quit date: 09/05/1996  . Smokeless tobacco: Never Used     Comment: Quit in 1998  . Alcohol use Yes     Comment: occasionally  . Drug use: No  . Sexual activity: Not on file   Other Topics Concern  . Not on file   Social History Narrative  . No narrative on file    No Known Allergies  Review of Systems  Constitutional: Negative.   HENT: Negative.   Eyes: Negative.   Respiratory: Negative.   Cardiovascular: Negative.   Gastrointestinal: Negative.   Genitourinary: Positive for frequency.  Musculoskeletal: Positive for back pain.  Skin: Negative.   Neurological: Negative.   Endo/Heme/Allergies: Negative.   Psychiatric/Behavioral: Negative.     Immunization History  Administered Date(s) Administered  . Pneumococcal Polysaccharide-23 12/21/1991  . Tdap 10/26/2007    Objective:  BP 116/68 (BP Location: Left Arm, Patient Position: Sitting, Cuff Size: Large)   Pulse 74   Temp (!) 97.4 F (36.3 C) (Oral)   Resp 16   Wt (!) 336 lb (152.4 kg)   BMI 46.86 kg/m   Physical Exam  Constitutional: He is oriented to person, place, and time and well-developed, well-nourished, and in no distress.  Eyes: Pupils are equal, round, and reactive to light. Conjunctivae and EOM are normal.  Neck: Normal range of motion. Neck supple.  Cardiovascular: Normal rate, regular rhythm, normal heart sounds and intact distal pulses.   Pulmonary/Chest: Effort normal and breath sounds normal.  Musculoskeletal: Normal range of motion.  Neurological: He is alert and oriented to person, place, and time. He has normal reflexes. Gait normal. GCS score is 15.  Skin: Skin is warm and dry.  Psychiatric: Mood, memory, affect and judgment normal.    Lab Results    Component Value Date   WBC 6.5 02/21/2016   HGB 12.6 (L) 02/21/2016   HCT 38.7 02/21/2016   PLT 255 02/21/2016   GLUCOSE 96 02/21/2016   CHOL 165 02/21/2016   TRIG 76 02/21/2016   HDL 52 02/21/2016   LDLCALC 98 02/21/2016   TSH 1.180 02/21/2016   PSA 0.2 06/22/2013   HGBA1C 5.5 02/21/2016    CMP     Component Value Date/Time   NA 139 02/21/2016 0000   K 4.4 02/21/2016 0000   CL 97 02/21/2016 0000   CO2 26 02/21/2016 0000   GLUCOSE 96 02/21/2016 0000   GLUCOSE 101 (H) 02/06/2016 1224   BUN 11 02/21/2016 0000   CREATININE 0.70 (L) 02/21/2016 0000   CALCIUM 9.2 02/21/2016 0000   PROT 6.2 02/21/2016 0000   ALBUMIN 4.2 02/21/2016 0000   AST 18 02/21/2016 0000   ALT 16 02/21/2016 0000   ALKPHOS 72 02/21/2016 0000  BILITOT 0.3 02/21/2016 0000   GFRNONAA 106 02/21/2016 0000   GFRAA 123 02/21/2016 0000    Assessment and Plan :  1. Essential (primary) hypertension Stable.   2. Urinary frequency  - POCT urinalysis dipstick  3. Obstructive sleep apnea syndrome   4. BMI 45.0-49.9, adult ([place])   5. Chronic bilateral low back pain, with sciatica presence unspecified   6. Benign prostatic hyperplasia with urinary frequency Start Flomax due to urinary frequency. Follow as needed. May need urology referral. - tamsulosin (FLOMAX) 0.4 MG CAPS capsule; Take 1 capsule (0.4 mg total) by mouth daily.  Dispense: 30 capsule; Refill: 11   HPI, Exam, and A&P Transcribed under the direction and in the presence of Avik Leoni L. Cranford Mon, MD  Electronically Signed: Katina Dung, CMA I have done the exam and reviewed the above chart and it is accurate to the best of my knowledge. Development worker, community has been used in this note in any air is in the dictation or transcription are unintentional.  [facility] 10/13/2016 8:29 AM

## 2016-10-30 ENCOUNTER — Ambulatory Visit (INDEPENDENT_AMBULATORY_CARE_PROVIDER_SITE_OTHER)
Admission: RE | Admit: 2016-10-30 | Discharge: 2016-10-30 | Disposition: A | Discharge status: Home / Self Care | Payer: Self-pay | Source: Ambulatory Visit | Attending: Cardiovascular Disease | Admitting: Cardiovascular Disease

## 2016-10-30 DIAGNOSIS — E782 Mixed hyperlipidemia: Secondary | ICD-10-CM

## 2016-11-03 ENCOUNTER — Other Ambulatory Visit: Payer: Self-pay | Admitting: Family Medicine

## 2017-02-03 ENCOUNTER — Other Ambulatory Visit: Payer: Self-pay | Admitting: Family Medicine

## 2017-02-03 MED ORDER — LOSARTAN POTASSIUM-HCTZ 100-12.5 MG PO TABS: 1.0000 | ORAL_TABLET | Freq: Every day | ORAL | 3 refills | Status: DC

## 2017-02-03 NOTE — Telephone Encounter (Signed)
Re sent-Jackalyn Haith V Rayneisha Bouza, RMA

## 2017-02-03 NOTE — Telephone Encounter (Signed)
[facility] faxed a refill request for the following medication.[facility] states that they have not received a new RX if sent, please resend. Thanks CC  losartan-hydrochlorothiazide (HYZAAR) 100-12.5 MG tablet

## 2017-04-15 ENCOUNTER — Ambulatory Visit (INDEPENDENT_AMBULATORY_CARE_PROVIDER_SITE_OTHER): Payer: BC Managed Care – PPO | Admitting: Family Medicine

## 2017-04-15 ENCOUNTER — Encounter: Payer: Self-pay | Admitting: Family Medicine

## 2017-04-15 VITALS — BP 112/68 | HR 68 | Temp 98.9°F | Resp 16 | Ht 71.0 in | Wt 331.0 lb

## 2017-04-15 DIAGNOSIS — Z0000 Encounter for general adult medical examination without abnormal findings: Secondary | ICD-10-CM | POA: Diagnosis not present

## 2017-04-15 DIAGNOSIS — R351 Nocturia: Secondary | ICD-10-CM | POA: Diagnosis not present

## 2017-04-15 DIAGNOSIS — Z1211 Encounter for screening for malignant neoplasm of colon: Secondary | ICD-10-CM | POA: Diagnosis not present

## 2017-04-15 DIAGNOSIS — J309 Allergic rhinitis, unspecified: Secondary | ICD-10-CM

## 2017-04-15 DIAGNOSIS — Z125 Encounter for screening for malignant neoplasm of prostate: Secondary | ICD-10-CM | POA: Diagnosis not present

## 2017-04-15 DIAGNOSIS — I10 Essential (primary) hypertension: Secondary | ICD-10-CM | POA: Diagnosis not present

## 2017-04-15 DIAGNOSIS — E782 Mixed hyperlipidemia: Secondary | ICD-10-CM

## 2017-04-15 LAB — POCT URINALYSIS DIPSTICK
Bilirubin, UA: NEGATIVE
GLUCOSE UA: NEGATIVE
Ketones, UA: NEGATIVE
LEUKOCYTES UA: NEGATIVE
NITRITE UA: NEGATIVE
PH UA: 7 (ref 5.0–8.0)
Protein, UA: NEGATIVE
RBC UA: NEGATIVE
UROBILINOGEN UA: 0.2 U/dL

## 2017-04-15 LAB — IFOBT (OCCULT BLOOD): IMMUNOLOGICAL FECAL OCCULT BLOOD TEST: NEGATIVE

## 2017-04-15 MED ORDER — AZELASTINE-FLUTICASONE 137-50 MCG/ACT NA SUSP: 2.0000 | Freq: Every day | NASAL | 3 refills | Status: DC

## 2017-04-15 MED ORDER — FINASTERIDE 5 MG PO TABS: 5.0000 mg | ORAL_TABLET | Freq: Every day | ORAL | 5 refills | Status: DC

## 2017-04-15 NOTE — Progress Notes (Signed)
Patient: Terry Cybulski., Male    DOB: 1960/09/23, 57 y.o.   MRN: [id number] Visit Date: 04/15/2017  Today's Provider: Wilhemena Durie, MD   Chief Complaint  Patient presents with  . Annual Exam  . Urinary Frequency   Subjective:    Annual physical exam Terry Petrucelli. is a 57 y.o. male who presents today for health maintenance and complete physical. He feels well. He reports exercising not regularly. He reports he is sleeping well.  Colonoscopy- 02/06/2016. Repeat in 3 years. Tdap- 10/26/2007.   Urinary frequency:  Patient reports that he still has to get up at least 2 times nightly to go to the bathroom. He is currently taking tamsulosin and reports that this helps, but it has not completley resolved.   Review of Systems  Constitutional: Negative.   HENT: Negative.   Eyes: Negative.   Respiratory: Negative.   Cardiovascular: Negative.   Gastrointestinal: Negative.   Endocrine: Negative.   Genitourinary: Positive for frequency.  Musculoskeletal: Positive for arthralgias and myalgias.  Skin: Negative.   Allergic/Immunologic: Negative.   Neurological: Positive for numbness.  Hematological: Negative.   Psychiatric/Behavioral: Negative.     Social History      He  reports that he quit smoking about 20 years ago. He quit after 22.00 years of use. he has never used smokeless tobacco. He reports that he drinks alcohol. He reports that he does not use drugs.       Social History   Socioeconomic History  . Marital status: Married    Spouse name: None  . Number of children: None  . Years of education: None  . Highest education level: None  Social Needs  . Financial resource strain: None  . Food insecurity - worry: None  . Food insecurity - inability: None  . Transportation needs - medical: None  . Transportation needs - non-medical: None  Occupational History  . None  Tobacco Use  . Smoking status: Former Smoker    Years: 22.00    Last attempt  to quit: 09/05/1996    Years since quitting: 20.6  . Smokeless tobacco: Never Used  . Tobacco comment: Quit in 1998  Substance and Sexual Activity  . Alcohol use: Yes    Comment: occasionally  . Drug use: No  . Sexual activity: None  Other Topics Concern  . None  Social History Narrative  . None    Past Medical History:  Diagnosis Date  . Anxiety    just recently  . Arthritis   . Cancer ([place])    skin cancer removed  . GERD (gastroesophageal reflux disease)   . Hypertension   . Pneumonia   . Sleep apnea    with cpap     Patient Active Problem List   Diagnosis Date Noted  . Hyperglycemia 02/20/2016  . Chronic bilateral low back pain 02/20/2016  . DDD (degenerative disc disease), lumbar 02/20/2016  . Lumbar spondylosis 02/20/2016  . DDD (degenerative disc disease), cervical 02/20/2016  . Cervical spondylolysis 02/20/2016  . History of colonic polyps 02/06/2016  . Encounter for screening colonoscopy 01/21/2016  . Mixed hyperlipidemia 10/01/2015  . Chest pressure 07/26/2015  . Paresthesia 07/26/2015  . Arthritis 01/02/2015  . Body mass index (BMI) of 50-59.9 in adult ([place]) 01/02/2015  . Essential (primary) hypertension 01/02/2015  . Adiposity 01/02/2015  . Apnea, sleep 01/02/2015  . Avitaminosis D 01/02/2015    Past Surgical History:  Procedure Laterality Date  .  EXCISION OF SKIN TAG N/A 02/06/2016   Procedure: EXCISION OF SKIN TAG;  Surgeon: [name], MD;  Location: [facility] ORS;  Service: General;  Laterality: N/A;  . TONSILLECTOMY      Family History        Family Status  Relation Name Status  . Mother  Alive  . Sister  Alive  . PGF  Deceased  . Father  Alive  . Son  Alive  . Son  Alive  . Annamarie Major  (Not Specified)        His family history includes Bipolar disorder in his mother; Depression in his sister; Prostate cancer in his paternal grandfather and paternal uncle; Thyroid disease in his mother.      No Known Allergies   Current  Outpatient Medications:  .  aspirin 81 MG tablet, Take 81 mg by mouth daily. , Disp: , Rfl:  .  cetirizine-pseudoephedrine (ZYRTEC-D ALLERGY & CONGESTION) 5-120 MG tablet, Take 1 tablet by mouth 2 (two) times daily. , Disp: , Rfl:  .  Cholecalciferol 1000 UNITS tablet, Take 1,000 Units by mouth daily. , Disp: , Rfl:  .  losartan-hydrochlorothiazide (HYZAAR) 100-12.5 MG tablet, Take 1 tablet by mouth daily., Disp: 90 tablet, Rfl: 3 .  Misc Natural Products (GLUCOSAMINE CHONDROITIN COMPLX) TABS, Take 1 tablet by mouth daily. , Disp: , Rfl:  .  Multiple Vitamin tablet, Take 1 tablet by mouth daily. , Disp: , Rfl:  .  OMEGA-3 FATTY ACIDS PO, Take 1 tablet by mouth daily. , Disp: , Rfl:  .  tamsulosin (FLOMAX) 0.4 MG CAPS capsule, Take 1 capsule (0.4 mg total) by mouth daily., Disp: 30 capsule, Rfl: 11 .  triamcinolone cream (KENALOG) 0.1 %, , Disp: , Rfl:  .  Turmeric, Curcuma Longa, POWD, Take by mouth. , Disp: , Rfl:  .  vitamin C (ASCORBIC ACID) 500 MG tablet, Take 500 mg by mouth daily. , Disp: , Rfl:  .  vitamin E 400 UNIT capsule, Take 400 Units by mouth daily. , Disp: , Rfl:  .  nabumetone (RELAFEN) 500 MG tablet, TAKE 1 TABLET TWICE DAILY AS NEEDED FOR PAIN (Patient not taking: Reported on 04/14/2016), Disp: 60 tablet, Rfl: 11   Patient Care Team: Jerrol Banana., MD as PCP - General (Family Medicine) Jerrol Banana., MD (Family Medicine) Bary Castilla, Forest Gleason, MD (General Surgery)      Objective:   Vitals: BP 112/68 (BP Location: Right Arm, Patient Position: Sitting, Cuff Size: Large)   Pulse 68   Temp 98.9 F (37.2 C)   Resp 16   Ht 5\' 11"  (1.803 m)   Wt (!) 331 lb (150.1 kg)   BMI 46.17 kg/m    Vitals:   04/15/17 0831  BP: 112/68  Pulse: 68  Resp: 16  Temp: 98.9 F (37.2 C)  Weight: (!) 331 lb (150.1 kg)  Height: 5\' 11"  (1.803 m)     Physical Exam  Constitutional: He is oriented to person, place, and time. He appears well-developed and well-nourished.    Obese WM NAD.  HENT:  Head: Normocephalic and atraumatic.  Right Ear: External ear normal.  Left Ear: External ear normal.  Nose: Nose normal.  Mouth/Throat: Oropharynx is clear and moist.  Eyes: Conjunctivae are normal. No scleral icterus.  Neck: No thyromegaly present.  Cardiovascular: Normal rate, regular rhythm, normal heart sounds and intact distal pulses.  Pulmonary/Chest: Effort normal and breath sounds normal.  Abdominal: Soft.  Genitourinary: Rectum normal, prostate  normal and penis normal.  Neurological: He is alert and oriented to person, place, and time.  Skin: Skin is warm and dry.  Psychiatric: He has a normal mood and affect. His behavior is normal. Judgment and thought content normal.     Depression Screen PHQ 2/9 Scores 04/15/2017 04/14/2016 09/23/2015 01/08/2015  PHQ - 2 Score 0 0 0 0      Assessment & Plan:     Routine Health Maintenance and Physical Exam  Exercise Activities and Dietary recommendations Goals    None      Immunization History  Administered Date(s) Administered  . Pneumococcal Polysaccharide-23 12/21/1991  . Tdap 10/26/2007    Health Maintenance  Topic Date Due  . Hepatitis C Screening  [birth year]  . HIV Screening  09/14/1975  . INFLUENZA VACCINE  05/31/2017 (Originally 09/30/2016)  . TETANUS/TDAP  10/25/2017  . COLONOSCOPY  02/05/2026     Discussed health benefits of physical activity, and encouraged him to engage in regular exercise appropriate for his age and condition.    1. Annual physical exam  - POCT urinalysis dipstick  2. Essential (primary) hypertension  - CBC with Differential/Platelet - Comprehensive metabolic panel - EKG 26-RSWN  3. Mixed hyperlipidemia  - Lipid panel - TSH  4. Prostate cancer screening  - PSA  5. Allergic rhinitis, unspecified seasonality, unspecified trigger  - Azelastine-Fluticasone (DYMISTA) 137-50 MCG/ACT SUSP; Place 2 sprays into the nose daily.  Dispense: 1 Bottle; Refill:  3  6. Nocturia  - finasteride (PROSCAR) 5 MG tablet; Take 1 tablet (5 mg total) by mouth daily.  Dispense: 30 tablet; Refill: 5   I have done the exam and reviewed the above chart and it is accurate to the best of my knowledge. Development worker, community has been used in this note in any air is in the dictation or transcription are unintentional.   Wilhemena Durie, MD  [place]

## 2017-04-16 LAB — COMPREHENSIVE METABOLIC PANEL
ALT: 22 IU/L (ref 0–44)
AST: 20 IU/L (ref 0–40)
Albumin/Globulin Ratio: 1.8 (ref 1.2–2.2)
Albumin: 4.4 g/dL (ref 3.5–5.5)
Alkaline Phosphatase: 68 IU/L (ref 39–117)
BILIRUBIN TOTAL: 0.4 mg/dL (ref 0.0–1.2)
BUN/Creatinine Ratio: 14 (ref 9–20)
BUN: 10 mg/dL (ref 6–24)
CALCIUM: 9.6 mg/dL (ref 8.7–10.2)
CO2: 25 mmol/L (ref 20–29)
Chloride: 98 mmol/L (ref 96–106)
Creatinine, Ser: 0.74 mg/dL — ABNORMAL LOW (ref 0.76–1.27)
GFR calc Af Amer: 119 mL/min/{1.73_m2} (ref >59)
GFR, EST NON AFRICAN AMERICAN: 103 mL/min/{1.73_m2} (ref >59)
GLOBULIN, TOTAL: 2.4 g/dL (ref 1.5–4.5)
Glucose: 91 mg/dL (ref 65–99)
POTASSIUM: 4.4 mmol/L (ref 3.5–5.2)
Sodium: 140 mmol/L (ref 134–144)
Total Protein: 6.8 g/dL (ref 6.0–8.5)

## 2017-04-16 LAB — LIPID PANEL
CHOL/HDL RATIO: 2.8 ratio (ref 0.0–5.0)
Cholesterol, Total: 175 mg/dL (ref 100–199)
HDL: 62 mg/dL (ref >39)
LDL Calculated: 97 mg/dL (ref 0–99)
TRIGLYCERIDES: 82 mg/dL (ref 0–149)
VLDL Cholesterol Cal: 16 mg/dL (ref 5–40)

## 2017-04-16 LAB — CBC WITH DIFFERENTIAL/PLATELET
BASOS: 1 %
Basophils Absolute: 0 10*3/uL (ref 0.0–0.2)
EOS (ABSOLUTE): 0.1 10*3/uL (ref 0.0–0.4)
EOS: 2 %
HEMATOCRIT: 40.9 % (ref 37.5–51.0)
Hemoglobin: 13.6 g/dL (ref 13.0–17.7)
IMMATURE GRANULOCYTES: 0 %
Immature Grans (Abs): 0 10*3/uL (ref 0.0–0.1)
LYMPHS ABS: 2.1 10*3/uL (ref 0.7–3.1)
Lymphs: 39 %
MCH: 27.9 pg (ref 26.6–33.0)
MCHC: 33.3 g/dL (ref 31.5–35.7)
MCV: 84 fL (ref 79–97)
MONOS ABS: 0.3 10*3/uL (ref 0.1–0.9)
Monocytes: 6 %
Neutrophils Absolute: 2.8 10*3/uL (ref 1.4–7.0)
Neutrophils: 52 %
Platelets: 284 10*3/uL (ref 150–379)
RBC: 4.88 x10E6/uL (ref 4.14–5.80)
RDW: 14.4 % (ref 12.3–15.4)
WBC: 5.4 10*3/uL (ref 3.4–10.8)

## 2017-04-16 LAB — PSA: Prostate Specific Ag, Serum: 0.3 ng/mL (ref 0.0–4.0)

## 2017-04-16 LAB — TSH: TSH: 0.995 u[IU]/mL (ref 0.450–4.500)

## 2017-04-19 ENCOUNTER — Telehealth: Payer: Self-pay

## 2017-04-19 NOTE — Telephone Encounter (Signed)
[id number] 

## 2017-04-19 NOTE — Telephone Encounter (Signed)
-----   Message from Jerrol Banana., MD sent at 04/16/2017  1:31 PM EST ----- Labs OK.

## 2017-04-21 NOTE — Telephone Encounter (Signed)
Tried calling patient. Patient's voice mailbox is full. Unable to leave a message. Will try calling back at a later date.

## 2017-04-26 NOTE — Telephone Encounter (Signed)
Mailbox is full, unable to leave a message 

## 2017-04-26 NOTE — Telephone Encounter (Signed)
Mailbox full

## 2017-05-28 ENCOUNTER — Encounter: Payer: Self-pay | Admitting: Family Medicine

## 2017-06-07 ENCOUNTER — Ambulatory Visit: Payer: BC Managed Care – PPO | Admitting: Family Medicine

## 2017-06-07 ENCOUNTER — Telehealth: Payer: Self-pay | Admitting: Family Medicine

## 2017-06-07 ENCOUNTER — Encounter: Payer: Self-pay | Admitting: Family Medicine

## 2017-06-07 VITALS — BP 118/68 | HR 70 | Temp 98.2°F | Resp 16 | Wt 334.0 lb

## 2017-06-07 DIAGNOSIS — R399 Unspecified symptoms and signs involving the genitourinary system: Secondary | ICD-10-CM | POA: Diagnosis not present

## 2017-06-07 DIAGNOSIS — N410 Acute prostatitis: Secondary | ICD-10-CM

## 2017-06-07 LAB — POCT URINALYSIS DIPSTICK
Bilirubin, UA: NEGATIVE
Glucose, UA: NEGATIVE
Ketones, UA: NEGATIVE
LEUKOCYTES UA: NEGATIVE
NITRITE UA: NEGATIVE
PH UA: 6.5 (ref 5.0–8.0)
PROTEIN UA: NEGATIVE
RBC UA: NEGATIVE
SPEC GRAV UA: 1.01 (ref 1.010–1.025)
Urobilinogen, UA: 0.2 E.U./dL

## 2017-06-07 MED ORDER — SULFAMETHOXAZOLE-TRIMETHOPRIM 800-160 MG PO TABS: 1.0000 | ORAL_TABLET | Freq: Two times a day (BID) | ORAL | 0 refills | Status: DC

## 2017-06-07 NOTE — Telephone Encounter (Signed)
pharmacy and pt informed.

## 2017-06-07 NOTE — Telephone Encounter (Signed)
Hold Losartan while on Septra.

## 2017-06-07 NOTE — Progress Notes (Signed)
Patient: Terry Horn. Male    DOB: 1960/12/07   57 y.o.   MRN: [id number] Visit Date: 06/07/2017  Today's Provider: Wilhemena Durie, MD   Chief Complaint  Patient presents with  . Urinary Tract Infection  . Rectal Bleeding   Subjective:    HPI Pt is here for Urinary symptoms. He reports that he has been have pressure when he urinates for a couple of weeks. Sometimes he has some dysuria. He has urinary retention and urgency. He has some lower abdominal pain and lower back pain. His father mentioned possibly adding oxybutynin. Decreased urine stream. He has also noticed bloody stools. He noticed blood in the toilet water.       No Known Allergies   Current Outpatient Medications:  .  aspirin 81 MG tablet, Take 81 mg by mouth daily. , Disp: , Rfl:  .  Azelastine-Fluticasone (DYMISTA) 137-50 MCG/ACT SUSP, Place 2 sprays into the nose daily., Disp: 1 Bottle, Rfl: 3 .  cetirizine-pseudoephedrine (ZYRTEC-D ALLERGY & CONGESTION) 5-120 MG tablet, Take 1 tablet by mouth 2 (two) times daily. , Disp: , Rfl:  .  Cholecalciferol 1000 UNITS tablet, Take 1,000 Units by mouth daily. , Disp: , Rfl:  .  finasteride (PROSCAR) 5 MG tablet, Take 1 tablet (5 mg total) by mouth daily., Disp: 30 tablet, Rfl: 5 .  losartan-hydrochlorothiazide (HYZAAR) 100-12.5 MG tablet, Take 1 tablet by mouth daily., Disp: 90 tablet, Rfl: 3 .  Misc Natural Products (GLUCOSAMINE CHONDROITIN COMPLX) TABS, Take 1 tablet by mouth daily. , Disp: , Rfl:  .  Multiple Vitamin tablet, Take 1 tablet by mouth daily. , Disp: , Rfl:  .  OMEGA-3 FATTY ACIDS PO, Take 1 tablet by mouth daily. , Disp: , Rfl:  .  tamsulosin (FLOMAX) 0.4 MG CAPS capsule, Take 1 capsule (0.4 mg total) by mouth daily., Disp: 30 capsule, Rfl: 11 .  triamcinolone cream (KENALOG) 0.1 %, , Disp: , Rfl:  .  Turmeric, Curcuma Longa, POWD, Take by mouth. , Disp: , Rfl:  .  vitamin C (ASCORBIC ACID) 500 MG tablet, Take 500 mg by mouth daily. , Disp:  , Rfl:  .  vitamin E 400 UNIT capsule, Take 400 Units by mouth daily. , Disp: , Rfl:  .  nabumetone (RELAFEN) 500 MG tablet, TAKE 1 TABLET TWICE DAILY AS NEEDED FOR PAIN (Patient not taking: Reported on 04/14/2016), Disp: 60 tablet, Rfl: 11  Review of Systems  Constitutional: Negative.   HENT: Negative.   Eyes: Negative.   Respiratory: Negative.   Cardiovascular: Negative.   Gastrointestinal: Positive for blood in stool.  Endocrine: Negative.   Genitourinary: Positive for dysuria, frequency and urgency.  Musculoskeletal: Positive for back pain.  Skin: Negative.   Allergic/Immunologic: Negative.   Neurological: Negative.   Hematological: Negative.   Psychiatric/Behavioral: Negative.     Social History   Tobacco Use  . Smoking status: Former Smoker    Years: 22.00    Last attempt to quit: 09/05/1996    Years since quitting: 20.7  . Smokeless tobacco: Never Used  . Tobacco comment: Quit in 1998  Substance Use Topics  . Alcohol use: Yes    Comment: occasionally   Objective:   BP 118/68 (BP Location: Left Arm, Patient Position: Sitting, Cuff Size: Large)   Pulse 70   Temp 98.2 F (36.8 C) (Oral)   Resp 16   Wt (!) 334 lb (151.5 kg)   BMI 46.58 kg/m  Vitals:  06/07/17 1117  BP: 118/68  Pulse: 70  Resp: 16  Temp: 98.2 F (36.8 C)  TempSrc: Oral  Weight: (!) 334 lb (151.5 kg)     Physical Exam  Constitutional: He is oriented to person, place, and time. He appears well-developed and well-nourished.  HENT:  Head: Normocephalic and atraumatic.  Eyes: Pupils are equal, round, and reactive to light. Conjunctivae and EOM are normal. No scleral icterus.  Neck: No thyromegaly present.  Cardiovascular: Normal rate, regular rhythm and normal heart sounds.  Pulmonary/Chest: Effort normal and breath sounds normal.  Abdominal: Soft. There is no tenderness.  Genitourinary:  Genitourinary Comments: Perianal exam WNL.  Lymphadenopathy:    He has no cervical adenopathy.    Neurological: He is alert and oriented to person, place, and time.  Skin: Skin is warm and dry.  Psychiatric: He has a normal mood and affect. His behavior is normal. Judgment and thought content normal.        Assessment & Plan:     1. UTI symptoms/Prostatitis RTC 1 month.No OAB. - POCT urinalysis dipstick  2. Acute prostatitis  - sulfamethoxazole-trimethoprim (BACTRIM DS,SEPTRA DS) 800-160 MG tablet; Take 1 tablet by mouth 2 (two) times daily.  Dispense: 28 tablet; Refill: 0 3.hemorrhoids Normal colonoscopy 12/17. 4.HTN    I have done the exam and reviewed the above chart and it is accurate to the best of my knowledge. Development worker, community has been used in this note in any air is in the dictation or transcription are unintentional.   Wilhemena Durie, MD  [place]

## 2017-06-07 NOTE — Telephone Encounter (Signed)
Please advise. Thanks.  

## 2017-06-07 NOTE — Telephone Encounter (Signed)
Pharmacist from [facility] called to say Bactrim has an  severe increase in Potassium when taking with  Losartan.  Please advise Pharmacy.

## 2017-07-01 ENCOUNTER — Encounter: Payer: Self-pay | Admitting: Family Medicine

## 2017-08-17 ENCOUNTER — Ambulatory Visit: Payer: BC Managed Care – PPO | Admitting: Family Medicine

## 2017-08-17 ENCOUNTER — Encounter: Payer: Self-pay | Admitting: Family Medicine

## 2017-08-17 VITALS — BP 122/70 | Temp 98.3°F | Resp 16 | Ht 71.0 in | Wt 336.0 lb

## 2017-08-17 DIAGNOSIS — N401 Enlarged prostate with lower urinary tract symptoms: Secondary | ICD-10-CM | POA: Diagnosis not present

## 2017-08-17 DIAGNOSIS — G4733 Obstructive sleep apnea (adult) (pediatric): Secondary | ICD-10-CM

## 2017-08-17 DIAGNOSIS — I10 Essential (primary) hypertension: Secondary | ICD-10-CM | POA: Diagnosis not present

## 2017-08-17 DIAGNOSIS — Z6841 Body Mass Index (BMI) 40.0 and over, adult: Secondary | ICD-10-CM

## 2017-08-17 DIAGNOSIS — R351 Nocturia: Secondary | ICD-10-CM | POA: Diagnosis not present

## 2017-08-17 NOTE — Progress Notes (Signed)
Patient: Terry Horn. Male    DOB: May 14, 1960   57 y.o.   MRN: [id number] Visit Date: 08/17/2017  Today's Provider: Wilhemena Durie, MD   Chief Complaint  Patient presents with  . Hypertension  . Hyperlipidemia  . Nocturia   Subjective:    HPI  Hypertension, follow-up:  BP Readings from Last 3 Encounters:  08/17/17 122/70  06/07/17 118/68  04/15/17 112/68    He was last seen for hypertension 4 months ago.  BP at that visit was 112/68. Management since that visit includes no changes. He reports good compliance with treatment. He is not having side effects.  He is not exercising. He is adherent to low salt diet.   Outside blood pressures are not being checked. He is experiencing none.  Patient denies exertional chest pressure/discomfort, lower extremity edema and palpitations.   Cardiovascular risk factors include dyslipidemia.   Weight trend: stable Wt Readings from Last 3 Encounters:  08/17/17 (!) 336 lb (152.4 kg)  06/07/17 (!) 334 lb (151.5 kg)  04/15/17 (!) 331 lb (150.1 kg)    Current diet: well balanced  Lipid/Cholesterol, Follow-up:   Last seen for this4 months ago.  Management changes since that visit include none. . Last Lipid Panel:    Component Value Date/Time   CHOL 175 04/15/2017 0941   TRIG 82 04/15/2017 0941   HDL 62 04/15/2017 0941   CHOLHDL 2.8 04/15/2017 0941   LDLCALC 97 04/15/2017 0941    Risk factors for vascular disease include hypertension.   He reports good compliance with treatment. He is not having side effects.  Current symptoms include none and have been stable.  Nocturia, follow up: Patient was started on finasteride 5mg  daily on last visit. Patient reports that he is still having to get up in the middle of the night to go to the bathroom at least twice.   No Known Allergies   Current Outpatient Medications:  .  aspirin 81 MG tablet, Take 81 mg by mouth daily. , Disp: , Rfl:  .   Azelastine-Fluticasone (DYMISTA) 137-50 MCG/ACT SUSP, Place 2 sprays into the nose daily., Disp: 1 Bottle, Rfl: 3 .  cetirizine-pseudoephedrine (ZYRTEC-D ALLERGY & CONGESTION) 5-120 MG tablet, Take 1 tablet by mouth 2 (two) times daily. , Disp: , Rfl:  .  Cholecalciferol 1000 UNITS tablet, Take 1,000 Units by mouth daily. , Disp: , Rfl:  .  finasteride (PROSCAR) 5 MG tablet, Take 1 tablet (5 mg total) by mouth daily., Disp: 30 tablet, Rfl: 5 .  losartan-hydrochlorothiazide (HYZAAR) 100-12.5 MG tablet, Take 1 tablet by mouth daily., Disp: 90 tablet, Rfl: 3 .  Misc Natural Products (GLUCOSAMINE CHONDROITIN COMPLX) TABS, Take 1 tablet by mouth daily. , Disp: , Rfl:  .  Multiple Vitamin tablet, Take 1 tablet by mouth daily. , Disp: , Rfl:  .  nabumetone (RELAFEN) 500 MG tablet, TAKE 1 TABLET TWICE DAILY AS NEEDED FOR PAIN (Patient not taking: Reported on 04/14/2016), Disp: 60 tablet, Rfl: 11 .  OMEGA-3 FATTY ACIDS PO, Take 1 tablet by mouth daily. , Disp: , Rfl:  .  sulfamethoxazole-trimethoprim (BACTRIM DS,SEPTRA DS) 800-160 MG tablet, Take 1 tablet by mouth 2 (two) times daily., Disp: 28 tablet, Rfl: 0 .  tamsulosin (FLOMAX) 0.4 MG CAPS capsule, Take 1 capsule (0.4 mg total) by mouth daily., Disp: 30 capsule, Rfl: 11 .  triamcinolone cream (KENALOG) 0.1 %, , Disp: , Rfl:  .  Turmeric, Curcuma Longa, POWD, Take by  mouth. , Disp: , Rfl:  .  vitamin C (ASCORBIC ACID) 500 MG tablet, Take 500 mg by mouth daily. , Disp: , Rfl:  .  vitamin E 400 UNIT capsule, Take 400 Units by mouth daily. , Disp: , Rfl:   Review of Systems  Constitutional: Negative for activity change, appetite change, chills, diaphoresis, fatigue, fever and unexpected weight change.  HENT: Negative.   Eyes: Negative.   Respiratory: Negative for shortness of breath.   Cardiovascular: Negative for chest pain, palpitations and leg swelling.  Gastrointestinal: Negative.   Endocrine: Negative for cold intolerance, heat intolerance and  polyphagia.  Genitourinary: Positive for frequency. Negative for discharge, dysuria, hematuria, penile pain, penile swelling, scrotal swelling, testicular pain and urgency.  Musculoskeletal: Negative.   Allergic/Immunologic: Negative.  Negative for environmental allergies.  Neurological: Negative for dizziness, light-headedness and headaches.  Psychiatric/Behavioral: Negative.     Social History   Tobacco Use  . Smoking status: Former Smoker    Years: 22.00    Last attempt to quit: 09/05/1996    Years since quitting: 20.9  . Smokeless tobacco: Never Used  . Tobacco comment: Quit in 1998  Substance Use Topics  . Alcohol use: Yes    Comment: occasionally   Objective:   BP 122/70 (BP Location: Right Arm, Patient Position: Sitting, Cuff Size: Large)   Temp 98.3 F (36.8 C)   Resp 16   Ht 5\' 11"  (1.803 m)   Wt (!) 336 lb (152.4 kg)   BMI 46.86 kg/m  Vitals:   08/17/17 0813  BP: 122/70  Resp: 16  Temp: 98.3 F (36.8 C)  Weight: (!) 336 lb (152.4 kg)  Height: 5\' 11"  (1.803 m)     Physical Exam  Constitutional: He is oriented to person, place, and time. He appears well-developed and well-nourished.  HENT:  Head: Normocephalic and atraumatic.  Right Ear: External ear normal.  Left Ear: External ear normal.  Nose: Nose normal.  Mouth/Throat: Oropharynx is clear and moist.  Eyes: Conjunctivae are normal. No scleral icterus.  Neck: No thyromegaly present.  Cardiovascular: Normal rate, regular rhythm and normal heart sounds.  Pulmonary/Chest: Effort normal and breath sounds normal.  Abdominal: Soft.  Neurological: He is alert and oriented to person, place, and time.  Skin: Skin is warm and dry.  Psychiatric: He has a normal mood and affect. His behavior is normal. Judgment and thought content normal.        Assessment & Plan:     OSA Pt wearing CPAP and feeling better. HTN Obesity BPH Refer to urology.    I have done the exam and reviewed the chart and it is  accurate to the best of my knowledge. Development worker, community has been used and  any errors in dictation or transcription are unintentional. Miguel Aschoff M.D. [place], MD  [facility]

## 2017-09-09 ENCOUNTER — Encounter: Payer: Self-pay | Admitting: Pain Medicine

## 2017-09-09 ENCOUNTER — Ambulatory Visit
Admission: RE | Admit: 2017-09-09 | Discharge: 2017-09-09 | Disposition: A | Discharge status: Home / Self Care | Payer: BC Managed Care – PPO | Source: Ambulatory Visit | Attending: Pain Medicine | Admitting: Pain Medicine

## 2017-09-09 ENCOUNTER — Ambulatory Visit (HOSPITAL_BASED_OUTPATIENT_CLINIC_OR_DEPARTMENT_OTHER): Payer: BC Managed Care – PPO | Admitting: Pain Medicine

## 2017-09-09 VITALS — BP 142/84 | HR 74 | Temp 98.2°F | Resp 16 | Ht 71.0 in | Wt 330.0 lb

## 2017-09-09 DIAGNOSIS — M4307 Spondylolysis, lumbosacral region: Secondary | ICD-10-CM | POA: Insufficient documentation

## 2017-09-09 DIAGNOSIS — F419 Anxiety disorder, unspecified: Secondary | ICD-10-CM | POA: Diagnosis not present

## 2017-09-09 DIAGNOSIS — I10 Essential (primary) hypertension: Secondary | ICD-10-CM | POA: Insufficient documentation

## 2017-09-09 DIAGNOSIS — Z87891 Personal history of nicotine dependence: Secondary | ICD-10-CM | POA: Insufficient documentation

## 2017-09-09 DIAGNOSIS — G8929 Other chronic pain: Secondary | ICD-10-CM | POA: Insufficient documentation

## 2017-09-09 DIAGNOSIS — R200 Anesthesia of skin: Secondary | ICD-10-CM | POA: Insufficient documentation

## 2017-09-09 DIAGNOSIS — M5442 Lumbago with sciatica, left side: Secondary | ICD-10-CM

## 2017-09-09 DIAGNOSIS — Z79899 Other long term (current) drug therapy: Secondary | ICD-10-CM | POA: Insufficient documentation

## 2017-09-09 DIAGNOSIS — G4730 Sleep apnea, unspecified: Secondary | ICD-10-CM | POA: Insufficient documentation

## 2017-09-09 DIAGNOSIS — M5030 Other cervical disc degeneration, unspecified cervical region: Secondary | ICD-10-CM | POA: Insufficient documentation

## 2017-09-09 DIAGNOSIS — M542 Cervicalgia: Secondary | ICD-10-CM | POA: Insufficient documentation

## 2017-09-09 DIAGNOSIS — K219 Gastro-esophageal reflux disease without esophagitis: Secondary | ICD-10-CM | POA: Diagnosis not present

## 2017-09-09 DIAGNOSIS — Z7982 Long term (current) use of aspirin: Secondary | ICD-10-CM | POA: Insufficient documentation

## 2017-09-09 DIAGNOSIS — M25562 Pain in left knee: Secondary | ICD-10-CM

## 2017-09-09 DIAGNOSIS — R94130 Abnormal response to nerve stimulation, unspecified: Secondary | ICD-10-CM | POA: Insufficient documentation

## 2017-09-09 DIAGNOSIS — M48061 Spinal stenosis, lumbar region without neurogenic claudication: Secondary | ICD-10-CM | POA: Insufficient documentation

## 2017-09-09 DIAGNOSIS — M170 Bilateral primary osteoarthritis of knee: Secondary | ICD-10-CM | POA: Diagnosis not present

## 2017-09-09 DIAGNOSIS — M545 Low back pain, unspecified: Secondary | ICD-10-CM | POA: Insufficient documentation

## 2017-09-09 DIAGNOSIS — G5603 Carpal tunnel syndrome, bilateral upper limbs: Secondary | ICD-10-CM | POA: Insufficient documentation

## 2017-09-09 DIAGNOSIS — M47817 Spondylosis without myelopathy or radiculopathy, lumbosacral region: Secondary | ICD-10-CM

## 2017-09-09 DIAGNOSIS — M25561 Pain in right knee: Secondary | ICD-10-CM

## 2017-09-09 DIAGNOSIS — M174 Other bilateral secondary osteoarthritis of knee: Secondary | ICD-10-CM | POA: Insufficient documentation

## 2017-09-09 DIAGNOSIS — M159 Polyosteoarthritis, unspecified: Secondary | ICD-10-CM | POA: Insufficient documentation

## 2017-09-09 DIAGNOSIS — M4802 Spinal stenosis, cervical region: Secondary | ICD-10-CM | POA: Insufficient documentation

## 2017-09-09 DIAGNOSIS — M4727 Other spondylosis with radiculopathy, lumbosacral region: Secondary | ICD-10-CM | POA: Insufficient documentation

## 2017-09-09 DIAGNOSIS — M47816 Spondylosis without myelopathy or radiculopathy, lumbar region: Secondary | ICD-10-CM | POA: Insufficient documentation

## 2017-09-09 DIAGNOSIS — Q7649 Other congenital malformations of spine, not associated with scoliosis: Secondary | ICD-10-CM | POA: Insufficient documentation

## 2017-09-09 MED ORDER — ROPIVACAINE HCL 2 MG/ML IJ SOLN
18.0000 mL | Freq: Once | INTRAMUSCULAR | Status: AC
  Administered 2017-09-09: 18 mL via PERINEURAL

## 2017-09-09 MED ORDER — ROPIVACAINE HCL 2 MG/ML IJ SOLN
INTRAMUSCULAR | Status: AC
  Filled 2017-09-09: qty 20

## 2017-09-09 MED ORDER — LIDOCAINE HCL 2 % IJ SOLN
INTRAMUSCULAR | Status: AC
  Filled 2017-09-09: qty 20

## 2017-09-09 MED ORDER — TRIAMCINOLONE ACETONIDE 40 MG/ML IJ SUSP
INTRAMUSCULAR | Status: AC
Start: 2017-09-09 — End: ?
  Filled 2017-09-09: qty 1

## 2017-09-09 MED ORDER — LIDOCAINE HCL 2 % IJ SOLN
20.0000 mL | Freq: Once | INTRAMUSCULAR | Status: AC
  Administered 2017-09-09: 400 mg

## 2017-09-09 MED ORDER — TRIAMCINOLONE ACETONIDE 40 MG/ML IJ SUSP
80.0000 mg | Freq: Once | INTRAMUSCULAR | Status: AC
  Administered 2017-09-09: 40 mg

## 2017-09-09 NOTE — Progress Notes (Signed)
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 patient no guarantees and we have made no promises. Enough time was given to the patient to ask questions, all of which were answered to the patient's satisfaction. Mr. Orourke has indicated that he wanted to continue with the procedure. Attestation: I, the ordering provider, attest that I have discussed with the patient the benefits, risks, side-effects, alternatives, likelihood of achieving goals, and potential problems during recovery for the procedure that I have provided informed consent. Date  Time: 09/09/2017  2:02 PM  Pre-Procedure Preparation:  Monitoring: As per clinic protocol. Respiration, ETCO2, SpO2, BP, heart rate and rhythm monitor placed and checked for adequate function Safety Precautions: Patient was assessed for positional comfort and pressure points before starting the procedure. Time-out: I initiated and conducted the "Time-out" before starting the procedure, as per  protocol. The patient was asked to participate by confirming the accuracy of the "Time Out"  information. Verification of the correct person, site, and procedure were performed and confirmed by me, the nursing staff, and the patient. "Time-out" conducted as per Joint Commission's Universal Protocol (UP.01.01.01). Time: 1527  Description of Procedure:          Position: Prone Laterality: Bilateral. The procedure was performed in identical fashion on both sides. Levels:  L2, L3, L4, L5, & S1 Medial Branch Level(s) Area Prepped: Posterior Lumbosacral Region Prepping solution: ChloraPrep (2% chlorhexidine gluconate and 70% isopropyl alcohol) Safety Precautions: Aspiration looking for blood return was conducted prior to all injections. At no point did we inject any substances, as a needle was being advanced. Before injecting, the patient was told to immediately notify me if he was experiencing any new onset of "ringing in the ears, or metallic taste in the mouth". No attempts were made at seeking any paresthesias. Safe injection practices and needle disposal techniques used. Medications properly checked for expiration dates. SDV (single dose vial) medications used. After the completion of the procedure, all disposable equipment used was discarded in the proper designated medical waste containers. Local Anesthesia: Protocol guidelines were followed. The patient was positioned over the fluoroscopy table. The area was prepped in the usual manner. The time-out was completed. The target area was identified using fluoroscopy. A 12-in long, straight, sterile hemostat was used with fluoroscopic guidance to locate the targets for each level blocked. Once located, the skin was marked with an approved surgical skin marker. Once all sites were marked, the skin (epidermis, dermis, and hypodermis), as well as deeper tissues (fat, connective tissue and muscle) were infiltrated with a small amount of a short-acting local  anesthetic, loaded on a 10cc syringe with a 25G, 1.5-in  Needle. An appropriate amount of time was allowed for local anesthetics to take effect before proceeding to the next step. Local Anesthetic: Lidocaine 2.0% The unused portion of the local anesthetic was discarded in the proper designated containers. Technical explanation of process:  L2 Medial Branch Nerve Block (MBB): The target area for the L2 medial branch is at the junction of the postero-lateral aspect of the superior articular process and the superior, posterior, and medial edge of the transverse process of L3. Under fluoroscopic guidance, a Quincke needle was inserted until contact was made with os over the superior postero-lateral aspect of the pedicular shadow (target area). After negative aspiration for blood, 0.5 mL of the nerve block solution was injected without difficulty or complication. The needle was removed intact. L3 Medial Branch Nerve Block (MBB): The target area for the L3 medial branch is at the junction of the postero-lateral aspect of the superior articular process and the superior, posterior, and medial edge of the transverse process of L4. Under fluoroscopic guidance, a Quincke needle was inserted until contact was made with os over the superior postero-lateral aspect of the pedicular shadow (target area). After negative aspiration for blood, 0.5 mL of the nerve block solution was injected without difficulty or complication. The needle was removed intact. L4 Medial Branch Nerve Block (MBB): The target area for the L4 medial branch is at the junction of the postero-lateral aspect of the superior articular process and the superior, posterior, and medial edge of the transverse process of L5. Under fluoroscopic guidance, a Quincke needle was inserted until contact was made with os over the superior postero-lateral aspect of the pedicular shadow (target area). After negative aspiration for blood, 0.5 mL of the nerve block solution  was injected without difficulty or complication. The needle was  removed intact. L5 Medial Branch Nerve Block (MBB): The target area for the L5 medial branch is at the junction of the postero-lateral aspect of the superior articular process and the superior, posterior, and medial edge of the sacral ala. Under fluoroscopic guidance, a Quincke needle was inserted until contact was made with os over the superior postero-lateral aspect of the pedicular shadow (target area). After negative aspiration for blood, 0.5 mL of the nerve block solution was injected without difficulty or complication. The needle was removed intact. S1 Medial Branch Nerve Block (MBB): The target area for the S1 medial branch is at the posterior and inferior 6 o'clock position of the L5-S1 facet joint. Under fluoroscopic guidance, the Quincke needle inserted for the L5 MBB was redirected until contact was made with os over the inferior and postero aspect of the sacrum, at the 6 o' clock position under the L5-S1 facet joint (Target area). After negative aspiration for blood, 0.5 mL of the nerve block solution was injected without difficulty or complication. The needle was removed intact. Procedural Needles: 22-gauge, 5-inch, Quincke needles used for all levels. Nerve block solution: 0.2% PF-Ropivacaine + Triamcinolone (40 mg/mL) diluted to a final concentration of 4 mg of Triamcinolone/mL of Ropivacaine The unused portion of the solution was discarded in the proper designated containers.  Once the entire procedure was completed, the treated area was cleaned, making sure to leave some of the prepping solution back to take advantage of its long term bactericidal properties.   Illustration of the posterior view of the lumbar spine and the posterior neural structures. Laminae of L2 through S1 are labeled. DPRL5, dorsal primary ramus of L5; DPRS1, dorsal primary ramus of S1; DPR3, dorsal primary ramus of L3; FJ, facet (zygapophyseal) joint  L3-L4; I, inferior articular process of L4; LB1, lateral branch of dorsal primary ramus of L1; IAB, inferior articular branches from L3 medial branch (supplies L4-L5 facet joint); IBP, intermediate branch plexus; MB3, medial branch of dorsal primary ramus of L3; NR3, third lumbar nerve root; S, superior articular process of L5; SAB, superior articular branches from L4 (supplies L4-5 facet joint also); TP3, transverse process of L3.  Vitals:   09/09/17 1525 09/09/17 1530 09/09/17 1535 09/09/17 1545  BP: 117/78 (!) 143/74 (!) 147/87 (!) 142/84  Pulse: 69 67 74 74  Resp: (!) '21 16 18 16  ' Temp:      TempSrc:      SpO2: 94% 95% 96% 97%  Weight:      Height:        Start Time: 1527 hrs. End Time:   hrs.  Imaging Guidance (Spinal):          Type of Imaging Technique: Fluoroscopy Guidance (Spinal) Indication(s): Assistance in needle guidance and placement for procedures requiring needle placement in or near specific anatomical locations not easily accessible without such assistance. Exposure Time: Please see nurses notes. Contrast: None used. Fluoroscopic Guidance: I was personally present during the use of fluoroscopy. "Tunnel Vision Technique" used to obtain the best possible view of the target area. Parallax error corrected before commencing the procedure. "Direction-depth-direction" technique used to introduce the needle under continuous pulsed fluoroscopy. Once target was reached, antero-posterior, oblique, and lateral fluoroscopic projection used confirm needle placement in all planes. Images permanently stored in EMR. Interpretation: No contrast injected. I personally interpreted the imaging intraoperatively. Adequate needle placement confirmed in multiple planes. Permanent images saved into the patient's record.  Antibiotic Prophylaxis:   Anti-infectives (From admission, onward)   None  Indication(s): None identified  Post-operative Assessment:  Post-procedure Vital Signs:   Pulse/HCG Rate: 74  Temp: 98.2 F (36.8 C) Resp: 16 BP: (!) 142/84 SpO2: 97 %  EBL: None  Complications: No immediate post-treatment complications observed by team, or reported by patient.  Note: The patient tolerated the entire procedure well. A repeat set of vitals were taken after the procedure and the patient was kept under observation following institutional policy, for this type of procedure. Post-procedural neurological assessment was performed, showing return to baseline, prior to discharge. The patient was provided with post-procedure discharge instructions, including a section on how to identify potential problems. Should any problems arise concerning this procedure, the patient was given instructions to immediately contact us, at any time, without hesitation. In any case, we plan to contact the patient by telephone for a follow-up status report regarding this interventional procedure.  Comments:  No additional relevant information.  Plan of Care    Imaging Orders     DG C-Arm 1-60 Min-No Report  Procedure Orders     LUMBAR FACET(MEDIAL BRANCH NERVE BLOCK) MBNB  Medications ordered for procedure: Meds ordered this encounter  Medications  . lidocaine (XYLOCAINE) 2 % (with pres) injection 400 mg  . ropivacaine (PF) 2 mg/mL (0.2%) (NAROPIN) injection 18 mL  . triamcinolone acetonide (KENALOG-40) injection 80 mg   Medications administered: We administered lidocaine, ropivacaine (PF) 2 mg/mL (0.2%), and triamcinolone acetonide.  See the medical record for exact dosing, route, and time of administration.  New Prescriptions   No medications on file   Disposition: Discharge home  Discharge Date & Time: 09/09/2017; 1547 hrs.   Physician-requested Follow-up: Return for post-procedure eval (2 wks), w/ Dr. Dossie Arbour.  Future Appointments  Date Time Provider [place]  09/27/2017  1:00 PM Milinda Pointer, MD [facility] None  04/18/2018  9:00 AM Jerrol Banana., MD [facility] None   Primary Care Physician: Jerrol Banana., MD Location: [facility] Note by: Gaspar Cola, MD Date: 09/09/2017; Time: 4:34 PM  Disclaimer:  Medicine is not an Chief Strategy Officer. The only guarantee in medicine is that nothing is guaranteed. It is important to note that the decision to proceed with this intervention was based on the information collected from the patient. The Data and conclusions were drawn from the patient's questionnaire, the interview, and the physical examination. Because the information was provided in large part by the patient, it cannot be guaranteed that it has not been purposely or unconsciously manipulated. Every effort has been made to obtain as much relevant data as possible for this evaluation. It is important to note that the conclusions that lead to this procedure are derived in large part from the available data. Always take into account that the treatment will also be dependent on availability of resources and existing treatment guidelines, considered by other Pain Management Practitioners as being common knowledge and practice, at the time of the intervention. For Medico-Legal purposes, it is also important to point out that variation in procedural techniques and pharmacological choices are the acceptable norm. The indications, contraindications, technique, and results of the above procedure should only be interpreted and judged by a Board-Certified Interventional Pain Specialist with extensive familiarity and expertise in the same exact procedure and technique.

## 2017-09-09 NOTE — Patient Instructions (Addendum)
____________________________________________________________________________________________  Post-Procedure Discharge Instructions  Instructions:  Apply ice: Fill a plastic sandwich bag with crushed ice. Cover it with a small towel and apply to injection site. Apply for 15 minutes then remove x 15 minutes. Repeat sequence on day of procedure, until you go to bed. The purpose is to minimize swelling and discomfort after procedure.  Apply heat: Apply heat to procedure site starting the day following the procedure. The purpose is to treat any soreness and discomfort from the procedure.  Food intake: Start with clear liquids (like water) and advance to regular food, as tolerated.   Physical activities: Keep activities to a minimum for the first 8 hours after the procedure.   Driving: If you have received any sedation, you are not allowed to drive for 24 hours after your procedure.  Blood thinner: Restart your blood thinner 6 hours after your procedure. (Only for those taking blood thinners)  Insulin: As soon as you can eat, you may resume your normal dosing schedule. (Only for those taking insulin)  Infection prevention: Keep procedure site clean and dry.  Post-procedure Pain Diary: Extremely important that this be done correctly and accurately. Recorded information will be used to determine the next step in treatment.  Pain evaluated is that of treated area only. Do not include pain from an untreated area.  Complete every hour, on the hour, for the initial 8 hours. Set an alarm to help you do this part accurately.  Do not go to sleep and have it completed later. It will not be accurate.  Follow-up appointment: Keep your follow-up appointment after the procedure. Usually 2 weeks for most procedures. (6 weeks in the case of radiofrequency.) Bring you pain diary.   Expect:  From numbing medicine (AKA: Local Anesthetics): Numbness or decrease in pain.  Onset: Full effect within 15  minutes of injected.  Duration: It will depend on the type of local anesthetic used. On the average, 1 to 8 hours.   From steroids: Decrease in swelling or inflammation. Once inflammation is improved, relief of the pain will follow.  Onset of benefits: Depends on the amount of swelling present. The more swelling, the longer it will take for the benefits to be seen. In some cases, up to 10 days.  Duration: Steroids will stay in the system x 2 weeks. Duration of benefits will depend on multiple posibilities including persistent irritating factors.  From procedure: Some discomfort is to be expected once the numbing medicine wears off. This should be minimal if ice and heat are applied as instructed.  Call if:  You experience numbness and weakness that gets worse with time, as opposed to wearing off.  New onset bowel or bladder incontinence. (This applies to Spinal procedures only)  Emergency Numbers:  Durning business hours (Monday - Thursday, 8:00 AM - 4:00 PM) (Friday, 9:00 AM - 12:00 Noon): [phone]  After hours: [phone] ____________________________________________________________________________________________   ____________________________________________________________________________________________  Pain Management Weight Control Diet   Note: Before starting this diet, make sure to talk to your primary care physician to make sure it is safe for you.   Breakfast:   1 boiled or pouched egg. You may use the egg white ready-made preparations.  1 wheat low calorie toast.   8 oz. black coffee with stevia (maximum of 2 packs). (No milk or creamer, and no other type of sweetener but stevia).   Lunch & Dinner:   5 oz. Lean Protein like chicken, fish, or lean meat (above 95% fat  free).   No pork or any type of cold cuts or processed meats.   Steamed, backed or grilled, but not fried.   No oils, fats, or butter.   1 cup of steamed vegetables, or 1.5 cups if  raw.   1 serving of salad, but no dressings, except vinegar or lemon juice.   Mid-day & Mid-afternoon snack:   1 fruit. No bananas. (total of 2 fruits per day)   Important Rules:  1. Must drink 100 oz. or more of water per day.   Consult your Primary Care Physician if you have a history of kidney failure, or congestive heart failure before doing this.  2. Take calcium and magnesium every day to avoid night cramps.   Consult your Primary Care Physician if you have a history of kidney failure, hypercalcemia, or parathyroid problems before doing this.   Over-the-counter calcium 600 to 1200 mg per day (in the morning). Take with Vitamin D 2000 IU every day.   Over-the-counter magnesium 400 to 500 mg per day (1-2 hours prior to bedtime).  3. Control salt intake. (You can use it but in moderation.)  4. Avoid eating anything after 6:00 pm.  5. Weight yourself every morning at the same time and record weight on a notebook.  6. Mix "Benefiber" 3 to 5 table spoons in water and drink before meals.  7. No sodas.  8. No alcohol.  9. No sugar.  10. No artificial sweeteners.   Stevia without sugar is the only sweetener aloud.  11. No bread except for the one breakfast toast.   Low calorie wheat bread.  12. Duration of diet: 2 weeks at a time with 2 days' rest, then repeat, until BMI is less than 30.  13. Your current Body mass index is 46.03 kg/m. 14. Do not over-eat or over-indulge yourself in the 2 days of rest from the diet.  ____________________________________________________________________________________________  ____________________________________________________________________________________________  Pain Scale  Introduction: The pain score used by this practice is the Verbal Numerical Rating Scale (VNRS-11). This is an 11-point scale. It is for adults and children 10 years or older. There are significant differences in how the pain score is reported, used, and applied. Forget  everything you learned in the past and learn this scoring system.  General Information: The scale should reflect your current level of pain. Unless you are specifically asked for the level of your worst pain, or your average pain. If you are asked for one of these two, then it should be understood that it is over the past 24 hours.  Basic Activities of Daily Living (ADL): Personal hygiene, dressing, eating, transferring, and using restroom.  Instructions: Most patients tend to report their level of pain as a combination of two factors, their physical pain and their psychosocial pain. This last one is also known as "suffering" and it is reflection of how physical pain affects you socially and psychologically. From now on, report them separately. From this point on, when asked to report your pain level, report only your physical pain. Use the following table for reference.  Pain Clinic Pain Levels (0-5/10)  Pain Level Score  Description  No Pain 0   Mild pain 1 Nagging, annoying, but does not interfere with basic activities of daily living (ADL). Patients are able to eat, bathe, get dressed, toileting (being able to get on and off the toilet and perform personal hygiene functions), transfer (move in and out of bed or a chair without assistance), and maintain continence (  able to control bladder and bowel functions). Blood pressure and heart rate are unaffected. A normal heart rate for a healthy adult ranges from 60 to 100 bpm (beats per minute).   Mild to moderate pain 2 Noticeable and distracting. Impossible to hide from other people. More frequent flare-ups. Still possible to adapt and function close to normal. It can be very annoying and may have occasional stronger flare-ups. With discipline, patients may get used to it and adapt.   Moderate pain 3 Interferes significantly with activities of daily living (ADL). It becomes difficult to feed, bathe, get dressed, get on and off the toilet or to perform  personal hygiene functions. Difficult to get in and out of bed or a chair without assistance. Very distracting. With effort, it can be ignored when deeply involved in activities.   Moderately severe pain 4 Impossible to ignore for more than a few minutes. With effort, patients may still be able to manage work or participate in some social activities. Very difficult to concentrate. Signs of autonomic nervous system discharge are evident: dilated pupils (mydriasis); mild sweating (diaphoresis); sleep interference. Heart rate becomes elevated (>115 bpm). Diastolic blood pressure (lower number) rises above 100 mmHg. Patients find relief in laying down and not moving.   Severe pain 5 Intense and extremely unpleasant. Associated with frowning face and frequent crying. Pain overwhelms the senses.  Ability to do any activity or maintain social relationships becomes significantly limited. Conversation becomes difficult. Pacing back and forth is common, as getting into a comfortable position is nearly impossible. Pain wakes you up from deep sleep. Physical signs will be obvious: pupillary dilation; increased sweating; goosebumps; brisk reflexes; cold, clammy hands and feet; nausea, vomiting or dry heaves; loss of appetite; significant sleep disturbance with inability to fall asleep or to remain asleep. When persistent, significant weight loss is observed due to the complete loss of appetite and sleep deprivation.  Blood pressure and heart rate becomes significantly elevated. Caution: If elevated blood pressure triggers a pounding headache associated with blurred vision, then the patient should immediately seek attention at an urgent or emergency care unit, as these may be signs of an impending stroke.    Emergency Department Pain Levels (6-10/10)  Emergency Room Pain 6 Severely limiting. Requires emergency care and should not be seen or managed at an outpatient pain management facility. Communication becomes  difficult and requires great effort. Assistance to reach the emergency department may be required. Facial flushing and profuse sweating along with potentially dangerous increases in heart rate and blood pressure will be evident.   Distressing pain 7 Self-care is very difficult. Assistance is required to transport, or use restroom. Assistance to reach the emergency department will be required. Tasks requiring coordination, such as bathing and getting dressed become very difficult.   Disabling pain 8 Self-care is no longer possible. At this level, pain is disabling. The individual is unable to do even the most "basic" activities such as walking, eating, bathing, dressing, transferring to a bed, or toileting. Fine motor skills are lost. It is difficult to think clearly.   Incapacitating pain 9 Pain becomes incapacitating. Thought processing is no longer possible. Difficult to remember your own name. Control of movement and coordination are lost.   The worst pain imaginable 10 At this level, most patients pass out from pain. When this level is reached, collapse of the autonomic nervous system occurs, leading to a sudden drop in blood pressure and heart rate. This in turn results in a temporary  and dramatic drop in blood flow to the brain, leading to a loss of consciousness. Fainting is one of the body's self defense mechanisms. Passing out puts the brain in a calmed state and causes it to shut down for a while, in order to begin the healing process.    Summary: 1. Refer to this scale when providing [place] with your pain level. 2. Be accurate and careful when reporting your pain level. This will help with your care. 3. Over-reporting your pain level will lead to loss of credibility. 4. Even a level of 1/10 means that there is pain and will be treated at our facility. 5. High, inaccurate reporting will be documented as "Symptom Exaggeration", leading to loss of credibility and suspicions of possible secondary  gains such as obtaining more narcotics, or wanting to appear disabled, for fraudulent reasons. 6. Only pain levels of 5 or below will be seen at our facility. 7. Pain levels of 6 and above will be sent to the Emergency Department and the appointment cancelled. ____________________________________________________________________________________________   Pain Management Discharge Instructions  General Discharge Instructions :  If you need to reach your doctor call: Monday-Friday 8:00 am - 4:00 pm at [phone] or toll free [phone].  After clinic hours [phone] to have operator reach doctor.  Bring all of your medication bottles to all your appointments in the pain clinic.  To cancel or reschedule your appointment with Pain Management please remember to call 24 hours in advance to avoid a fee.  Refer to the educational materials which you have been given on: General Risks, I had my Procedure. Discharge Instructions, Post Sedation.  Post Procedure Instructions:  The drugs you were given will stay in your system until tomorrow, so for the next 24 hours you should not drive, make any legal decisions or drink any alcoholic beverages.  You may eat anything you prefer, but it is better to start with liquids then soups and crackers, and gradually work up to solid foods.  Please notify your doctor immediately if you have any unusual bleeding, trouble breathing or pain that is not related to your normal pain.  Depending on the type of procedure that was done, some parts of your body may feel week and/or numb.  This usually clears up by tonight or the next day.  Walk with the use of an assistive device or accompanied by an adult for the 24 hours.  You may use ice on the affected area for the first 24 hours.  Put ice in a Ziploc bag and cover with a towel and place against area 15 minutes on 15 minutes off.  You may switch to heat after 24 hours. Facet Joint Block The facet joints  connect the bones of the spine (vertebrae). They make it possible for you to bend, twist, and make other movements with your spine. They also keep you from bending too far, twisting too far, and making other excessive movements. A facet joint block is a procedure where a numbing medicine (anesthetic) is injected into a facet joint. Often, a type of anti-inflammatory medicine called a steroid is also injected. A facet joint block may be done to diagnose neck or back pain. If the pain gets better after a facet joint block, it means the pain is probably coming from the facet joint. If the pain does not get better, it means the pain is probably not coming from the facet joint. A facet joint block may also be done to relieve neck or  back pain caused by an inflamed facet joint. A facet joint block is only done to relieve pain if the pain does not improve with other methods, such as medicine, exercise programs, and physical therapy. Tell a health care provider about:  Any allergies you have.  All medicines you are taking, including vitamins, herbs, eye drops, creams, and over-the-counter medicines.  Any problems you or family members have had with anesthetic medicines.  Any blood disorders you have.  Any surgeries you have had.  Any medical conditions you have.  Whether you are pregnant or may be pregnant. What are the risks? Generally, this is a safe procedure. However, problems may occur, including:  Bleeding.  Injury to a nerve near the injection site.  Pain at the injection site.  Weakness or numbness in areas controlled by nerves near the injection site.  Infection.  Temporary fluid retention.  Allergic reactions to medicines or dyes.  Injury to other structures or organs near the injection site.  What happens before the procedure?  Follow instructions from your health care provider about eating or drinking restrictions.  Ask your health care provider about: ? Changing or  stopping your regular medicines. This is especially important if you are taking diabetes medicines or blood thinners. ? Taking medicines such as aspirin and ibuprofen. These medicines can thin your blood. Do not take these medicines before your procedure if your health care provider instructs you not to.  Do not take any new dietary supplements or medicines without asking your health care provider first.  Plan to have someone take you home after the procedure. What happens during the procedure?  You may need to remove your clothing and dress in an open-back gown.  The procedure will be done while you are lying on an X-ray table. You will most likely be asked to lie on your stomach, but you may be asked to lie in a different position if an injection will be made in your neck.  Machines will be used to monitor your oxygen levels, heart rate, and blood pressure.  If an injection will be made in your neck, an IV tube will be inserted into one of your veins. Fluids and medicine will flow directly into your body through the IV tube.  The area over the facet joint where the injection will be made will be cleaned with soap. The surrounding skin will be covered with clean drapes.  A numbing medicine (local anesthetic) will be applied to your skin. Your skin may sting or burn for a moment.  A video X-ray machine (fluoroscopy) will be used to locate the joint. In some cases, a CT scan may be used.  A contrast dye may be injected into the facet joint area to help locate the joint.  When the joint is located, an anesthetic will be injected into the joint through the needle.  Your health care provider will ask you whether you feel pain relief. If you do feel relief, a steroid may be injected to provide pain relief for a longer period of time. If you do not feel relief or feel only partial relief, additional injections of an anesthetic may be made in other facet joints.  The needle will be  removed.  Your skin will be cleaned.  A bandage (dressing) will be applied over each injection site. The procedure may vary among health care providers and hospitals. What happens after the procedure?  You will be observed for 15-30 minutes before being allowed to go  home. This information is not intended to replace advice given to you by your health care provider. Make sure you discuss any questions you have with your health care provider. Document Released: 07/08/2006 Document Revised: 03/20/2015 Document Reviewed: 11/12/2014 Elsevier Interactive Patient Education  [employer].

## 2017-09-10 ENCOUNTER — Telehealth: Payer: Self-pay

## 2017-09-10 NOTE — Telephone Encounter (Signed)
Post procedure phone call.  LM 

## 2017-09-13 ENCOUNTER — Telehealth: Payer: Self-pay | Admitting: *Deleted

## 2017-09-26 NOTE — Progress Notes (Signed)
Patient's Name: Terry Horn.  MRN: [id number]  Referring Provider: Jerrol Banana.,*  DOB: 10-14-1960  PCP: Jerrol Banana., MD  DOS: 09/27/2017  Note by: Gaspar Cola, MD  Service setting: Ambulatory outpatient  Specialty: Interventional Pain Management  Location: [facility] (AMB) Pain Management Facility    Patient type: Established ("FAST-TRACK" Evaluation)   Primary Reason(s) for Visit: Encounter for post-procedure evaluation of chronic illness with mild to moderate exacerbation CC: Back Pain (low) and Leg Pain (bilateral and lateral, some tingling in legs and feet too)  HPI  Terry Horn is a 57 y.o. year old, male patient, who comes today for a post-procedure evaluation. He has Arthritis; Body mass index (BMI) of 50-59.9 in adult [facility]); Essential (primary) hypertension; Adiposity; Apnea, sleep; Avitaminosis D; Chest pressure; Paresthesia; Encounter for screening colonoscopy; History of colonic polyps; Mixed hyperlipidemia; Hyperglycemia; Chronic low back pain (Primary Area of Pain) (Bilateral) w/ sciatica (Left); DDD (degenerative disc disease), lumbar; Lumbar spondylosis; DDD (degenerative disc disease), cervical; Cervical spondylolysis; Chronic knee pain (Bilateral); Osteoarthritis of knee (Secondary) (Bilateral); Congenital Lumbar central spinal stenosis; Cervical central spinal stenosis; Carpal tunnel syndrome (Bilateral); Abnormal nerve conduction studies; Osteoarthritis of spine with radiculopathy, lumbosacral region; Osteoarthritis of facet joint of lumbar spine; Osteoarthritis involving multiple joints on both sides of body; Cervicalgia; Chronic neck pain; Numbness of upper extremity (Bilateral); Numbness of lower extremity (Left); Spondylosis without myelopathy or radiculopathy, lumbosacral region; Lumbar facet syndrome (Midline pain pattern) (Bilateral); Lumbar facet arthropathy (L3-4, L4-L5, and L5-S1) (Bilateral); and Chronic midline low back pain without sciatica on  their problem list. His primarily concern today is the Back Pain (low) and Leg Pain (bilateral and lateral, some tingling in legs and feet too)  Pain Assessment: Location: Lower Back Radiating: legs Onset: More than a month ago Duration: Chronic pain Quality: Dull, Aching Severity: 2 /10 (subjective, self-reported pain score)  Note: Reported level is compatible with observation.                         When using our objective Pain Scale, levels between 6 and 10/10 are said to belong in an emergency room, as it progressively worsens from a 6/10, described as severely limiting, requiring emergency care not usually available at an outpatient pain management facility. At a 6/10 level, communication becomes difficult and requires great effort. Assistance to reach the emergency department may be required. Facial flushing and profuse sweating along with potentially dangerous increases in heart rate and blood pressure will be evident. Timing: Intermittent Modifying factors: procedures, rest BP: 109/72  HR: 84  Terry Horn comes in today for post-procedure evaluation after the treatment done on 09/13/2017.  Further details on both, my assessment(s), as well as the proposed treatment plan, please see below.  Post-Procedure Assessment  09/09/2017 Procedure: Diagnostic bilateral lumbar facet block #1 under fluoroscopic guidance, no sedation Pre-procedure pain score:  5/10 Post-procedure pain score: 0/10 (100% relief) Influential Factors: BMI: 46.03 kg/m Intra-procedural challenges: None observed.         Assessment challenges: None detected.              Reported side-effects: None.        Post-procedural adverse reactions or complications: None reported         Sedation: No sedation used. When no sedatives are used, the analgesic levels obtained are directly associated to the effectiveness of the local anesthetics. However, when sedation is provided, the level of analgesia obtained during  the  initial 1 hour following the intervention, is believed to be the result of a combination of factors. These factors may include, but are not limited to: 1. The effectiveness of the local anesthetics used. 2. The effects of the analgesic(s) and/or anxiolytic(s) used. 3. The degree of discomfort experienced by the patient at the time of the procedure. 4. The patients ability and reliability in recalling and recording the events. 5. The presence and influence of possible secondary gains and/or psychosocial factors. Reported result: Relief experienced during the 1st hour after the procedure: 100 % (Ultra-Short Term Relief) Terry Horn has indicated area to have been numb during this time. Interpretative annotation: Clinically appropriate result. Analgesia during this period is likely to be Local Anesthetic and/or IV Sedative (Analgesic/Anxiolytic) related.          Effects of local anesthetic: The analgesic effects attained during this period are directly associated to the localized infiltration of local anesthetics and therefore cary significant diagnostic value as to the etiological location, or anatomical origin, of the pain. Expected duration of relief is directly dependent on the pharmacodynamics of the local anesthetic used. Long-acting (4-6 hours) anesthetics used.  Reported result: Relief during the next 4 to 6 hour after the procedure: 100 % (Short-Term Relief) Terry Horn has indicated area to have been numb during this time. Interpretative annotation: Clinically appropriate result. Analgesia during this period is likely to be Local Anesthetic-related.          Long-term benefit: Defined as the period of time past the expected duration of local anesthetics (1 hour for short-acting and 4-6 hours for long-acting). With the possible exception of prolonged sympathetic blockade from the local anesthetics, benefits during this period are typically attributed to, or associated with, other factors such as  analgesic sensory neuropraxia, antiinflammatory effects, or beneficial biochemical changes provided by agents other than the local anesthetics.  Reported result: Extended relief following procedure: 50 % (Long-Term Relief)            Interpretative annotation: Clinically appropriate result. Good relief. No permanent benefit expected. Inflammation plays a part in the etiology to the pain. Benefit believed to be steroid-related.  Current benefits: Defined as reported results that persistent at this point in time.   Analgesia: >50 % Mr. Achille reports improvement of axial symptoms. Function: Mr. Umbarger reports improvement in function ROM: Mr. Bellanca reports improvement in ROM Interpretative annotation: Ongoing benefit. Therapeutic benefit observed. Effective therapeutic approach.          Interpretation: Results would suggest a successful diagnostic intervention.                  Plan:  Set up procedure as a PRN palliative treatment option for this patient.                Laboratory Chemistry  Inflammation Markers (CRP: Acute Phase) (ESR: Chronic Phase) No results found.  Renal Markers Lab Results  Component Value Date   BUN 10 04/15/2017   CREATININE 0.74 (L) 04/15/2017   BCR 14 04/15/2017   GFRAA 119 04/15/2017   GFRNONAA 103 04/15/2017                             Hepatic Markers Lab Results  Component Value Date   AST 20 04/15/2017   ALT 22 04/15/2017   ALBUMIN 4.4 04/15/2017  Neuropathy Markers Lab Results  Component Value Date   HGBA1C 5.5 02/21/2016                        Hematology Parameters Lab Results  Component Value Date   PLT 284 04/15/2017   HGB 13.6 04/15/2017   HCT 40.9 04/15/2017                        CV Markers No results found.  Note: Lab results reviewed.  Recent Diagnostic Imaging Results  DG C-Arm 1-60 Min-No Report Fluoroscopy was utilized by the requesting physician.  No radiographic  interpretation.   Complexity  Note: I personally reviewed the fluoroscopic imaging of the procedure.                        Meds   Current Outpatient Medications:  .  aspirin 81 MG tablet, Take 81 mg by mouth daily. , Disp: , Rfl:  .  Azelastine-Fluticasone (DYMISTA) 137-50 MCG/ACT SUSP, Place 2 sprays into the nose daily., Disp: 1 Bottle, Rfl: 3 .  cetirizine-pseudoephedrine (ZYRTEC-D ALLERGY & CONGESTION) 5-120 MG tablet, Take 1 tablet by mouth 2 (two) times daily. , Disp: , Rfl:  .  Cholecalciferol 1000 UNITS tablet, Take 1,000 Units by mouth daily. , Disp: , Rfl:  .  finasteride (PROSCAR) 5 MG tablet, Take 1 tablet (5 mg total) by mouth daily., Disp: 30 tablet, Rfl: 5 .  gabapentin (NEURONTIN) 100 MG capsule, Take 100 mg by mouth 3 (three) times daily., Disp: , Rfl:  .  losartan-hydrochlorothiazide (HYZAAR) 100-12.5 MG tablet, Take 1 tablet by mouth daily., Disp: 90 tablet, Rfl: 3 .  Misc Natural Products (GLUCOSAMINE CHONDROITIN COMPLX) TABS, Take 1 tablet by mouth daily. , Disp: , Rfl:  .  Multiple Vitamin tablet, Take 1 tablet by mouth daily. , Disp: , Rfl:  .  OMEGA-3 FATTY ACIDS PO, Take 1 tablet by mouth daily. , Disp: , Rfl:  .  pravastatin (PRAVACHOL) 20 MG tablet, Take 20 mg by mouth daily., Disp: , Rfl:  .  tamsulosin (FLOMAX) 0.4 MG CAPS capsule, Take 1 capsule (0.4 mg total) by mouth daily., Disp: 30 capsule, Rfl: 11 .  Turmeric, Curcuma [place], POWD, Take by mouth. , Disp: , Rfl:  .  vitamin C (ASCORBIC ACID) 500 MG tablet, Take 500 mg by mouth daily. , Disp: , Rfl:  .  vitamin E 400 UNIT capsule, Take 400 Units by mouth daily. , Disp: , Rfl:   ROS  Constitutional: Denies any fever or chills Gastrointestinal: No reported hemesis, hematochezia, vomiting, or acute GI distress Musculoskeletal: Denies any acute onset joint swelling, redness, loss of ROM, or weakness Neurological: No reported episodes of acute onset apraxia, aphasia, dysarthria, agnosia, amnesia, paralysis, loss of coordination, or loss of  consciousness  Allergies  Mr. Filippi has No Known Allergies.  [place]  Drug: Mr. Ormiston  reports that he does not use drugs. Alcohol:  reports that he drinks alcohol. Tobacco:  reports that he quit smoking about 21 years ago. He quit after 22.00 years of use. He has never used smokeless tobacco. Medical:  has a past medical history of Anxiety, Arthritis, Cancer ([place]), GERD (gastroesophageal reflux disease), Hypertension, Pneumonia, and Sleep apnea. Surgical: Mr. Sinkfield  has a past surgical history that includes Tonsillectomy and Excision of skin tag (N/A, 02/06/2016). Family: family history includes Bipolar disorder in his mother; Depression in his sister; Prostate  cancer in his paternal grandfather and paternal uncle; Thyroid disease in his mother.  Constitutional Exam  General appearance: Well nourished, well developed, and well hydrated. In no apparent acute distress Vitals:   09/27/17 1336  BP: 109/72  Pulse: 84  Resp: 18  SpO2: 97%  Weight: (!) 330 lb (149.7 kg)  Height: '5\' 11"'  (1.803 m)   BMI Assessment: Estimated body mass index is 46.03 kg/m as calculated from the following:   Height as of this encounter: '5\' 11"'  (1.803 m).   Weight as of this encounter: 330 lb (149.7 kg).  BMI interpretation table: BMI level Category Range association with higher incidence of chronic pain  <18 kg/m2 Underweight   18.5-24.9 kg/m2 Ideal body weight   25-29.9 kg/m2 Overweight Increased incidence by 20%  30-34.9 kg/m2 Obese (Class I) Increased incidence by 68%  35-39.9 kg/m2 Severe obesity (Class II) Increased incidence by 136%  >40 kg/m2 Extreme obesity (Class III) Increased incidence by 254%   Patient's current BMI Ideal Body weight  Body mass index is 46.03 kg/m. Ideal body weight: 75.3 kg (166 lb 0.1 oz) Adjusted ideal body weight: 105.1 kg (231 lb 9.7 oz)   BMI Readings from Last 4 Encounters:  09/27/17 46.03 kg/m  09/09/17 46.03 kg/m  08/17/17 46.86 kg/m  06/07/17 46.58 kg/m    Wt Readings from Last 4 Encounters:  09/27/17 (!) 330 lb (149.7 kg)  09/09/17 (!) 330 lb (149.7 kg)  08/17/17 (!) 336 lb (152.4 kg)  06/07/17 (!) 334 lb (151.5 kg)  Psych/Mental status: Alert, oriented x 3 (person, place, & time)       Eyes: PERLA Respiratory: No evidence of acute respiratory distress  Cervical Spine Area Exam  Skin & Axial Inspection: No masses, redness, edema, swelling, or associated skin lesions Alignment: Symmetrical Functional ROM: Unrestricted ROM      Stability: No instability detected Muscle Tone/Strength: Functionally intact. No obvious neuro-muscular anomalies detected. Sensory (Neurological): Unimpaired Palpation: No palpable anomalies              Upper Extremity (UE) Exam    Side: Right upper extremity  Side: Left upper extremity  Skin & Extremity Inspection: Skin color, temperature, and hair growth are WNL. No peripheral edema or cyanosis. No masses, redness, swelling, asymmetry, or associated skin lesions. No contractures.  Skin & Extremity Inspection: Skin color, temperature, and hair growth are WNL. No peripheral edema or cyanosis. No masses, redness, swelling, asymmetry, or associated skin lesions. No contractures.  Functional ROM: Unrestricted ROM          Functional ROM: Unrestricted ROM          Muscle Tone/Strength: Functionally intact. No obvious neuro-muscular anomalies detected.  Muscle Tone/Strength: Functionally intact. No obvious neuro-muscular anomalies detected.  Sensory (Neurological): Unimpaired          Sensory (Neurological): Unimpaired          Palpation: No palpable anomalies              Palpation: No palpable anomalies              Provocative Test(s):  Phalen's test: deferred Tinel's test: deferred Apley's scratch test (touch opposite shoulder):  Action 1 (Across chest): deferred Action 2 (Overhead): deferred Action 3 (LB reach): deferred   Provocative Test(s):  Phalen's test: deferred Tinel's test: deferred Apley's  scratch test (touch opposite shoulder):  Action 1 (Across chest): deferred Action 2 (Overhead): deferred Action 3 (LB reach): deferred    Thoracic Spine Area Exam  Skin & Axial Inspection: No masses, redness, or swelling Alignment: Symmetrical Functional ROM: Unrestricted ROM Stability: No instability detected Muscle Tone/Strength: Functionally intact. No obvious neuro-muscular anomalies detected. Sensory (Neurological): Unimpaired Muscle strength & Tone: No palpable anomalies  Lumbar Spine Area Exam  Skin & Axial Inspection: No masses, redness, or swelling Alignment: Symmetrical Functional ROM: Improved after treatment       Stability: No instability detected Muscle Tone/Strength: Functionally intact. No obvious neuro-muscular anomalies detected. Sensory (Neurological): Unimpaired Palpation: No palpable anomalies       Provocative Tests: Lumbar Hyperextension/rotation test: Improved after treatment       Lumbar quadrant test (Kemp's test): Improved after treatment       Lumbar Lateral bending test: deferred today       Patrick's Maneuver: deferred today                   FABER test: deferred today                   Thigh-thrust test: deferred today       S-I compression test: deferred today       S-I distraction test: deferred today        Gait & Posture Assessment  Ambulation: Unassisted Gait: Modified gait pattern (slower gait speed, wider stride width, and longer stance duration) associated with morbid obesity Posture: WNL   Lower Extremity Exam    Side: Right lower extremity  Side: Left lower extremity  Stability: No instability observed          Stability: No instability observed          Skin & Extremity Inspection: Skin color, temperature, and hair growth are WNL. No peripheral edema or cyanosis. No masses, redness, swelling, asymmetry, or associated skin lesions. No contractures.  Skin & Extremity Inspection: Skin color, temperature, and hair growth are WNL. No  peripheral edema or cyanosis. No masses, redness, swelling, asymmetry, or associated skin lesions. No contractures.  Functional ROM: Unrestricted ROM                  Functional ROM: Unrestricted ROM                  Muscle Tone/Strength: Functionally intact. No obvious neuro-muscular anomalies detected.  Muscle Tone/Strength: Functionally intact. No obvious neuro-muscular anomalies detected.  Sensory (Neurological): Unimpaired  Sensory (Neurological): Unimpaired  Palpation: No palpable anomalies  Palpation: No palpable anomalies   Assessment  Primary Diagnosis & Pertinent Problem List: The primary encounter diagnosis was Chronic low back pain (Primary Area of Pain) (Bilateral) w/ sciatica (Left). Diagnoses of Lumbar facet arthropathy (L3-4, L4-L5, and L5-S1) (Bilateral), Lumbar facet syndrome (Midline pain pattern) (Bilateral), Chronic knee pain (Bilateral), Chronic neck pain, Spondylosis without myelopathy or radiculopathy, lumbosacral region, and Osteoarthritis of spine with radiculopathy, lumbosacral region were also pertinent to this visit.  Status Diagnosis  Improved Stable Controlled 1. Chronic low back pain (Primary Area of Pain) (Bilateral) w/ sciatica (Left)   2. Lumbar facet arthropathy (L3-4, L4-L5, and L5-S1) (Bilateral)   3. Lumbar facet syndrome (Midline pain pattern) (Bilateral)   4. Chronic knee pain (Bilateral)   5. Chronic neck pain   6. Spondylosis without myelopathy or radiculopathy, lumbosacral region   7. Osteoarthritis of spine with radiculopathy, lumbosacral region     Problems updated and reviewed during this visit: No problems updated. Plan of Care  Pharmacotherapy (Medications Ordered): No orders of the defined types were placed in this encounter.  Medications administered today:  Regis T. Charise Killian. "Tom" had no medications administered during this visit.  Procedure Orders    No procedure(s) ordered today   Lab Orders  No laboratory test(s) ordered  today   Imaging Orders  No imaging studies ordered today   Referral Orders  No referral(s) requested today    Interventional management options: Planned, scheduled, and/or pending:   None at this time    Considering:   Diagnostic bilateral lumbar facet block #2 under fluoroscopic guidance and IV sedation    Palliative PRN treatment(s):   Diagnostic bilateral lumbar facet block #2 under fluoroscopic guidance and IV sedation    Provider-requested follow-up: Return for PRN Procedure: (B) L-FCT BLK #2.  Future Appointments  Date Time Provider [place]  04/18/2018  9:00 AM Jerrol Banana., MD [id number] None   Primary Care Physician: Jerrol Banana., MD Location: [facility] Note by: Gaspar Cola, MD Date: 09/27/2017; Time: 2:05 PM

## 2017-09-27 ENCOUNTER — Encounter: Payer: Self-pay | Admitting: Pain Medicine

## 2017-09-27 ENCOUNTER — Other Ambulatory Visit: Payer: Self-pay

## 2017-09-27 ENCOUNTER — Ambulatory Visit: Payer: BC Managed Care – PPO | Attending: Pain Medicine | Admitting: Pain Medicine

## 2017-09-27 VITALS — BP 109/72 | HR 84 | Resp 18 | Ht 71.0 in | Wt 330.0 lb

## 2017-09-27 DIAGNOSIS — M5442 Lumbago with sciatica, left side: Secondary | ICD-10-CM

## 2017-09-27 DIAGNOSIS — Z7982 Long term (current) use of aspirin: Secondary | ICD-10-CM | POA: Insufficient documentation

## 2017-09-27 DIAGNOSIS — R0789 Other chest pain: Secondary | ICD-10-CM | POA: Diagnosis not present

## 2017-09-27 DIAGNOSIS — M47817 Spondylosis without myelopathy or radiculopathy, lumbosacral region: Secondary | ICD-10-CM

## 2017-09-27 DIAGNOSIS — G8929 Other chronic pain: Secondary | ICD-10-CM

## 2017-09-27 DIAGNOSIS — G4730 Sleep apnea, unspecified: Secondary | ICD-10-CM | POA: Insufficient documentation

## 2017-09-27 DIAGNOSIS — I10 Essential (primary) hypertension: Secondary | ICD-10-CM | POA: Insufficient documentation

## 2017-09-27 DIAGNOSIS — Z1211 Encounter for screening for malignant neoplasm of colon: Secondary | ICD-10-CM | POA: Diagnosis not present

## 2017-09-27 DIAGNOSIS — R202 Paresthesia of skin: Secondary | ICD-10-CM | POA: Insufficient documentation

## 2017-09-27 DIAGNOSIS — M4727 Other spondylosis with radiculopathy, lumbosacral region: Secondary | ICD-10-CM | POA: Diagnosis not present

## 2017-09-27 DIAGNOSIS — M48061 Spinal stenosis, lumbar region without neurogenic claudication: Secondary | ICD-10-CM | POA: Diagnosis not present

## 2017-09-27 DIAGNOSIS — E782 Mixed hyperlipidemia: Secondary | ICD-10-CM | POA: Diagnosis not present

## 2017-09-27 DIAGNOSIS — E559 Vitamin D deficiency, unspecified: Secondary | ICD-10-CM | POA: Diagnosis not present

## 2017-09-27 DIAGNOSIS — M5136 Other intervertebral disc degeneration, lumbar region: Secondary | ICD-10-CM | POA: Insufficient documentation

## 2017-09-27 DIAGNOSIS — M170 Bilateral primary osteoarthritis of knee: Secondary | ICD-10-CM | POA: Insufficient documentation

## 2017-09-27 DIAGNOSIS — E669 Obesity, unspecified: Secondary | ICD-10-CM | POA: Diagnosis not present

## 2017-09-27 DIAGNOSIS — Z6841 Body Mass Index (BMI) 40.0 and over, adult: Secondary | ICD-10-CM | POA: Diagnosis not present

## 2017-09-27 DIAGNOSIS — Z87891 Personal history of nicotine dependence: Secondary | ICD-10-CM | POA: Diagnosis not present

## 2017-09-27 DIAGNOSIS — M542 Cervicalgia: Secondary | ICD-10-CM | POA: Diagnosis not present

## 2017-09-27 DIAGNOSIS — M25561 Pain in right knee: Secondary | ICD-10-CM | POA: Insufficient documentation

## 2017-09-27 DIAGNOSIS — R739 Hyperglycemia, unspecified: Secondary | ICD-10-CM | POA: Diagnosis not present

## 2017-09-27 DIAGNOSIS — Z79899 Other long term (current) drug therapy: Secondary | ICD-10-CM | POA: Diagnosis not present

## 2017-09-27 DIAGNOSIS — M25562 Pain in left knee: Secondary | ICD-10-CM | POA: Insufficient documentation

## 2017-09-27 DIAGNOSIS — M47816 Spondylosis without myelopathy or radiculopathy, lumbar region: Secondary | ICD-10-CM | POA: Diagnosis not present

## 2017-09-27 DIAGNOSIS — Z818 Family history of other mental and behavioral disorders: Secondary | ICD-10-CM | POA: Insufficient documentation

## 2017-09-27 DIAGNOSIS — G5603 Carpal tunnel syndrome, bilateral upper limbs: Secondary | ICD-10-CM | POA: Diagnosis not present

## 2017-09-27 DIAGNOSIS — M47812 Spondylosis without myelopathy or radiculopathy, cervical region: Secondary | ICD-10-CM | POA: Insufficient documentation

## 2017-09-27 DIAGNOSIS — Z8349 Family history of other endocrine, nutritional and metabolic diseases: Secondary | ICD-10-CM | POA: Insufficient documentation

## 2017-09-27 DIAGNOSIS — Z8042 Family history of malignant neoplasm of prostate: Secondary | ICD-10-CM | POA: Insufficient documentation

## 2017-09-27 NOTE — Patient Instructions (Addendum)
____________________________________________________________________________________________  Preparing for Procedure with Sedation  Instructions: . Oral Intake: Do not eat or drink anything for at least 8 hours prior to your procedure. . Transportation: Public transportation is not allowed. Bring an adult driver. The driver must be physically present in our waiting room before any procedure can be started. . Physical Assistance: Bring an adult physically capable of assisting you, in the event you need help. This adult should keep you company at home for at least 6 hours after the procedure. . Blood Pressure Medicine: Take your blood pressure medicine with a sip of water the morning of the procedure. . Blood thinners: Notify our staff if you are taking any blood thinners. Depending on which one you take, there will be specific instructions on how and when to stop it. . Diabetics on insulin: Notify the staff so that you can be scheduled 1st case in the morning. If your diabetes requires high dose insulin, take only  of your normal insulin dose the morning of the procedure and notify the staff that you have done so. . Preventing infections: Shower with an antibacterial soap the morning of your procedure. . Build-up your immune system: Take 1000 mg of Vitamin C with every meal (3 times a day) the day prior to your procedure. . Antibiotics: Inform the staff if you have a condition or reason that requires you to take antibiotics before dental procedures. . Pregnancy: If you are pregnant, call and cancel the procedure. . Sickness: If you have a cold, fever, or any active infections, call and cancel the procedure. . Arrival: You must be in the facility at least 30 minutes prior to your scheduled procedure. . Children: Do not bring children with you. . Dress appropriately: Bring dark clothing that you would not mind if they get stained. . Valuables: Do not bring any jewelry or valuables.  Procedure  appointments are reserved for interventional treatments only. . No Prescription Refills. . No medication changes will be discussed during procedure appointments. . No disability issues will be discussed.  Reasons to call and reschedule or cancel your procedure: (Following these recommendations will minimize the risk of a serious complication.) . Surgeries: Avoid having procedures within 2 weeks of any surgery. (Avoid for 2 weeks before or after any surgery). . Flu Shots: Avoid having procedures within 2 weeks of a flu shots or . (Avoid for 2 weeks before or after immunizations). . Barium: Avoid having a procedure within 7-10 days after having had a radiological study involving the use of radiological contrast. (Myelograms, Barium swallow or enema study). . Heart attacks: Avoid any elective procedures or surgeries for the initial 6 months after a "Myocardial Infarction" (Heart Attack). . Blood thinners: It is imperative that you stop these medications before procedures. Let us know if you if you take any blood thinner.  . Infection: Avoid procedures during or within two weeks of an infection (including chest colds or gastrointestinal problems). Symptoms associated with infections include: Localized redness, fever, chills, night sweats or profuse sweating, burning sensation when voiding, cough, congestion, stuffiness, runny nose, sore throat, diarrhea, nausea, vomiting, cold or Flu symptoms, recent or current infections. It is specially important if the infection is over the area that we intend to treat. . Heart and lung problems: Symptoms that may suggest an active cardiopulmonary problem include: cough, chest pain, breathing difficulties or shortness of breath, dizziness, ankle swelling, uncontrolled high or unusually low blood pressure, and/or palpitations. If you are experiencing any of these symptoms, cancel   your procedure and contact your primary care physician for an evaluation.  Remember:   Regular Business hours are:  Monday to Thursday 8:00 AM to 4:00 PM  Provider's Schedule: Ronaldo Crilly, MD:  Procedure days: Tuesday and Thursday 7:30 AM to 4:00 PM  Bilal Lateef, MD:  Procedure days: Monday and Wednesday 7:30 AM to 4:00 PM ____________________________________________________________________________________________   ____________________________________________________________________________________________  Pain Prevention Technique  Definition:   A technique used to minimize the effects of an activity known to cause inflammation or swelling, which in turn leads to an increase in pain.  Purpose: To prevent swelling from occurring. It is based on the fact that it is easier to prevent swelling from happening than it is to get rid of it, once it occurs.  Contraindications: 1. Anyone with allergy or hypersensitivity to the recommended medications. 2. Anyone taking anticoagulants (Blood Thinners) (e.g., Coumadin, Warfarin, Plavix, etc.). 3. Patients in Renal Failure.  Technique: Before you undertake an activity known to cause pain, or a flare-up of your chronic pain, and before you experience any pain, do the following:  1. On a full stomach, take 4 (four) over the counter Ibuprofens 200mg tablets (Motrin), for a total of 800 mg. 2. In addition, take over the counter Magnesium 400 to 500 mg, before doing the activity.  3. Six (6) hours later, again on a full stomach, repeat the Ibuprofen. 4. That night, take a warm shower and stretch under the running warm water.  This technique may be sufficient to abort the pain and discomfort before it happens. Keep in mind that it takes a lot less medication to prevent swelling than it takes to eliminate it once it occurs.  ____________________________________________________________________________________________    

## 2017-09-27 NOTE — Progress Notes (Signed)
Safety precautions to be maintained throughout the outpatient stay will include: orient to surroundings, keep bed in low position, maintain call bell within reach at all times, provide assistance with transfer out of bed and ambulation.  

## 2017-10-03 ENCOUNTER — Other Ambulatory Visit: Payer: Self-pay | Admitting: Family Medicine

## 2017-10-03 DIAGNOSIS — R350 Frequency of micturition: Principal | ICD-10-CM

## 2017-10-03 DIAGNOSIS — N401 Enlarged prostate with lower urinary tract symptoms: Secondary | ICD-10-CM

## 2017-10-11 ENCOUNTER — Other Ambulatory Visit: Payer: Self-pay | Admitting: Family Medicine

## 2017-10-11 DIAGNOSIS — R351 Nocturia: Secondary | ICD-10-CM

## 2017-10-12 ENCOUNTER — Other Ambulatory Visit: Payer: Self-pay | Admitting: Family Medicine

## 2017-10-12 DIAGNOSIS — R351 Nocturia: Secondary | ICD-10-CM

## 2017-11-15 ENCOUNTER — Encounter: Payer: Self-pay | Admitting: Family Medicine

## 2017-11-16 ENCOUNTER — Other Ambulatory Visit: Payer: Self-pay

## 2017-11-16 DIAGNOSIS — N401 Enlarged prostate with lower urinary tract symptoms: Secondary | ICD-10-CM

## 2017-11-16 DIAGNOSIS — R350 Frequency of micturition: Principal | ICD-10-CM

## 2017-11-22 NOTE — Progress Notes (Signed)
Patient's Name: Terry Horn.  MRN: [id number]  Referring Provider: Jerrol Banana.,*  DOB: 20-Apr-1960  PCP: Jerrol Banana., MD  DOS: 11/23/2017  Note by: Gaspar Cola, MD  Service setting: Ambulatory outpatient  Specialty: Interventional Pain Management  Patient type: Established  Location: [facility] (AMB) Pain Management Facility  Visit type: Interventional Procedure   Primary Reason for Visit: Interventional Pain Management Treatment. CC: Back Pain (lower)  Procedure:          Anesthesia, Analgesia, Anxiolysis:  Type: Lumbar Facet, Medial Branch Block(s) #2  Primary Purpose: Diagnostic Region: Posterolateral Lumbosacral Spine Level: L2, L3, L4, L5, & S1 Medial Branch Level(s). Injecting these levels blocks the L3-4, L4-5, and L5-S1 lumbar facet joints. Laterality: Bilateral  Type: Local Anesthesia Indication(s): Analgesia         Route: Infiltration ([place]/IM) IV Access: Declined Sedation: Declined  Local Anesthetic: Lidocaine 1-2%   Position: Prone   Indications: 1. Spondylosis without myelopathy or radiculopathy, lumbosacral region   2. Lumbar facet syndrome (Midline pain pattern) (Bilateral)   3. Lumbar facet arthropathy (L3-4, L4-L5, and L5-S1) (Bilateral)   4. Osteoarthritis of facet joint of lumbar spine   5. Chronic low back pain (Primary Area of Pain) (Bilateral) w/ sciatica (Left)    Pain Score: Pre-procedure: 1 /10 Post-procedure: 1 /10  Pre-op Assessment:  Mr. Arch is a 57 y.o. (year old), male patient, seen today for interventional treatment. He  has a past surgical history that includes Tonsillectomy and Excision of skin tag (N/A, 02/06/2016). Mr. Rajewski has a current medication list which includes the following prescription(s): aspirin, azelastine-fluticasone, cetirizine-pseudoephedrine, cholecalciferol, finasteride, finasteride, gabapentin, losartan-hydrochlorothiazide, glucosamine chondroitin complx, multiple vitamin, omega-3 fatty acids,  pravastatin, tamsulosin, turmeric (curcuma longa), vitamin c, and vitamin e. His primarily concern today is the Back Pain (lower)  Initial Vital Signs:  Pulse/HCG Rate: (!) 58ECG Heart Rate: 73 Temp: 98 F (36.7 C) Resp: 16 BP: (!) 122/55 SpO2: 100 %  BMI: Estimated body mass index is 46.03 kg/m as calculated from the following:   Height as of this encounter: 5\' 11"  (1.803 m).   Weight as of this encounter: 330 lb (149.7 kg).  Risk Assessment: Allergies: Reviewed. He has No Known Allergies.  Allergy Precautions: None required Coagulopathies: Reviewed. None identified.  Blood-thinner therapy: None at this time Active Infection(s): Reviewed. None identified. Mr. Kazmierski is afebrile  Site Confirmation: Mr. Closson was asked to confirm the procedure and laterality before marking the site Procedure checklist: Completed Consent: Before the procedure and under the influence of no sedative(s), amnesic(s), or anxiolytics, the patient was informed of the treatment options, risks and possible complications. To fulfill our ethical and legal obligations, as recommended by the American Medical Association's Code of Ethics, I have informed the patient of my clinical impression; the nature and purpose of the treatment or procedure; the risks, benefits, and possible complications of the intervention; the alternatives, including doing nothing; the risk(s) and benefit(s) of the alternative treatment(s) or procedure(s); and the risk(s) and benefit(s) of doing nothing. The patient was provided information about the general risks and possible complications associated with the procedure. These may include, but are not limited to: failure to achieve desired goals, infection, bleeding, organ or nerve damage, allergic reactions, paralysis, and death. In addition, the patient was informed of those risks and complications associated to Spine-related procedures, such as failure to decrease pain; infection (i.e.:  Meningitis, epidural or intraspinal abscess); bleeding (i.e.: epidural hematoma, subarachnoid hemorrhage, or any other  type of intraspinal or peri-dural bleeding); organ or nerve damage (i.e.: Any type of peripheral nerve, nerve root, or spinal cord injury) with subsequent damage to sensory, motor, and/or autonomic systems, resulting in permanent pain, numbness, and/or weakness of one or several areas of the body; allergic reactions; (i.e.: anaphylactic reaction); and/or death. Furthermore, the patient was informed of those risks and complications associated with the medications. These include, but are not limited to: allergic reactions (i.e.: anaphylactic or anaphylactoid reaction(s)); adrenal axis suppression; blood sugar elevation that in diabetics may result in ketoacidosis or comma; water retention that in patients with history of congestive heart failure may result in shortness of breath, pulmonary edema, and decompensation with resultant heart failure; weight gain; swelling or edema; medication-induced neural toxicity; particulate matter embolism and blood vessel occlusion with resultant organ, and/or nervous system infarction; and/or aseptic necrosis of one or more joints. Finally, the patient was informed that Medicine is not an exact science; therefore, there is also the possibility of unforeseen or unpredictable risks and/or possible complications that may result in a catastrophic outcome. The patient indicated having understood very clearly. We have given the patient no guarantees and we have made no promises. Enough time was given to the patient to ask questions, all of which were answered to the patient's satisfaction. Mr. Conover has indicated that he wanted to continue with the procedure. Attestation: I, the ordering provider, attest that I have discussed with the patient the benefits, risks, side-effects, alternatives, likelihood of achieving goals, and potential problems during recovery for the  procedure that I have provided informed consent. Date  Time: 11/23/2017  8:07 AM  Pre-Procedure Preparation:  Monitoring: As per clinic protocol. Respiration, ETCO2, SpO2, BP, heart rate and rhythm monitor placed and checked for adequate function Safety Precautions: Patient was assessed for positional comfort and pressure points before starting the procedure. Time-out: I initiated and conducted the "Time-out" before starting the procedure, as per protocol. The patient was asked to participate by confirming the accuracy of the "Time Out" information. Verification of the correct person, site, and procedure were performed and confirmed by me, the nursing staff, and the patient. "Time-out" conducted as per Joint Commission's Universal Protocol (UP.01.01.01). Time: 9678  Description of Procedure:          Laterality: Bilateral. The procedure was performed in identical fashion on both sides. Levels:  L2, L3, L4, L5, & S1 Medial Branch Level(s) Area Prepped: Posterior Lumbosacral Region Prepping solution: ChloraPrep (2% chlorhexidine gluconate and 70% isopropyl alcohol) Safety Precautions: Aspiration looking for blood return was conducted prior to all injections. At no point did we inject any substances, as a needle was being advanced. Before injecting, the patient was told to immediately notify me if he was experiencing any new onset of "ringing in the ears, or metallic taste in the mouth". No attempts were made at seeking any paresthesias. Safe injection practices and needle disposal techniques used. Medications properly checked for expiration dates. SDV (single dose vial) medications used. After the completion of the procedure, all disposable equipment used was discarded in the proper designated medical waste containers. Local Anesthesia: Protocol guidelines were followed. The patient was positioned over the fluoroscopy table. The area was prepped in the usual manner. The time-out was completed. The  target area was identified using fluoroscopy. A 12-in long, straight, sterile hemostat was used with fluoroscopic guidance to locate the targets for each level blocked. Once located, the skin was marked with an approved surgical skin marker. Once all sites were  marked, the skin (epidermis, dermis, and hypodermis), as well as deeper tissues (fat, connective tissue and muscle) were infiltrated with a small amount of a short-acting local anesthetic, loaded on a 10cc syringe with a 25G, 1.5-in  Needle. An appropriate amount of time was allowed for local anesthetics to take effect before proceeding to the next step. Local Anesthetic: Lidocaine 2.0% The unused portion of the local anesthetic was discarded in the proper designated containers. Technical explanation of process:  L2 Medial Branch Nerve Block (MBB): The target area for the L2 medial branch is at the junction of the postero-lateral aspect of the superior articular process and the superior, posterior, and medial edge of the transverse process of L3. Under fluoroscopic guidance, a Quincke needle was inserted until contact was made with os over the superior postero-lateral aspect of the pedicular shadow (target area). After negative aspiration for blood, 0.5 mL of the nerve block solution was injected without difficulty or complication. The needle was removed intact. L3 Medial Branch Nerve Block (MBB): The target area for the L3 medial branch is at the junction of the postero-lateral aspect of the superior articular process and the superior, posterior, and medial edge of the transverse process of L4. Under fluoroscopic guidance, a Quincke needle was inserted until contact was made with os over the superior postero-lateral aspect of the pedicular shadow (target area). After negative aspiration for blood, 0.5 mL of the nerve block solution was injected without difficulty or complication. The needle was removed intact. L4 Medial Branch Nerve Block (MBB): The  target area for the L4 medial branch is at the junction of the postero-lateral aspect of the superior articular process and the superior, posterior, and medial edge of the transverse process of L5. Under fluoroscopic guidance, a Quincke needle was inserted until contact was made with os over the superior postero-lateral aspect of the pedicular shadow (target area). After negative aspiration for blood, 0.5 mL of the nerve block solution was injected without difficulty or complication. The needle was removed intact. L5 Medial Branch Nerve Block (MBB): The target area for the L5 medial branch is at the junction of the postero-lateral aspect of the superior articular process and the superior, posterior, and medial edge of the sacral ala. Under fluoroscopic guidance, a Quincke needle was inserted until contact was made with os over the superior postero-lateral aspect of the pedicular shadow (target area). After negative aspiration for blood, 0.5 mL of the nerve block solution was injected without difficulty or complication. The needle was removed intact. S1 Medial Branch Nerve Block (MBB): The target area for the S1 medial branch is at the posterior and inferior 6 o'clock position of the L5-S1 facet joint. Under fluoroscopic guidance, the Quincke needle inserted for the L5 MBB was redirected until contact was made with os over the inferior and postero aspect of the sacrum, at the 6 o' clock position under the L5-S1 facet joint (Target area). After negative aspiration for blood, 0.5 mL of the nerve block solution was injected without difficulty or complication. The needle was removed intact. Procedural Needles: 22-gauge, 3.5-inch, Quincke needles used for all levels. Nerve block solution: 0.2% PF-Ropivacaine + Triamcinolone (40 mg/mL) diluted to a final concentration of 4 mg of Triamcinolone/mL of Ropivacaine The unused portion of the solution was discarded in the proper designated containers.  Once the entire  procedure was completed, the treated area was cleaned, making sure to leave some of the prepping solution back to take advantage of its long term bactericidal properties.  Illustration of the posterior view of the lumbar spine and the posterior neural structures. Laminae of L2 through S1 are labeled. DPRL5, dorsal primary ramus of L5; DPRS1, dorsal primary ramus of S1; DPR3, dorsal primary ramus of L3; FJ, facet (zygapophyseal) joint L3-L4; I, inferior articular process of L4; LB1, lateral branch of dorsal primary ramus of L1; IAB, inferior articular branches from L3 medial branch (supplies L4-L5 facet joint); IBP, intermediate branch plexus; MB3, medial branch of dorsal primary ramus of L3; NR3, third lumbar nerve root; S, superior articular process of L5; SAB, superior articular branches from L4 (supplies L4-5 facet joint also); TP3, transverse process of L3.  Vitals:   11/23/17 0847 11/23/17 0853 11/23/17 0858 11/23/17 0901  BP: 119/81 133/70 (!) 127/57 120/72  Pulse:      Resp: 16 17 12 17   Temp:      TempSrc:      SpO2: 99% 99% 99% 98%  Weight:      Height:         Start Time: 0847 hrs. End Time: 0901 hrs.  Imaging Guidance (Spinal):          Type of Imaging Technique: Fluoroscopy Guidance (Spinal) Indication(s): Assistance in needle guidance and placement for procedures requiring needle placement in or near specific anatomical locations not easily accessible without such assistance. Exposure Time: Please see nurses notes. Contrast: None used. Fluoroscopic Guidance: I was personally present during the use of fluoroscopy. "Tunnel Vision Technique" used to obtain the best possible view of the target area. Parallax error corrected before commencing the procedure. "Direction-depth-direction" technique used to introduce the needle under continuous pulsed fluoroscopy. Once target was reached, antero-posterior, oblique, and lateral fluoroscopic projection used confirm needle placement in all  planes. Images permanently stored in EMR. Interpretation: No contrast injected. I personally interpreted the imaging intraoperatively. Adequate needle placement confirmed in multiple planes. Permanent images saved into the patient's record.  Antibiotic Prophylaxis:   Anti-infectives (From admission, onward)   None     Indication(s): None identified  Post-operative Assessment:  Post-procedure Vital Signs:  Pulse/HCG Rate: (!) 5868 Temp: 98 F (36.7 C) Resp: 17 BP: 120/72 SpO2: 98 %  EBL: None  Complications: No immediate post-treatment complications observed by team, or reported by patient.  Note: The patient tolerated the entire procedure well. A repeat set of vitals were taken after the procedure and the patient was kept under observation following institutional policy, for this type of procedure. Post-procedural neurological assessment was performed, showing return to baseline, prior to discharge. The patient was provided with post-procedure discharge instructions, including a section on how to identify potential problems. Should any problems arise concerning this procedure, the patient was given instructions to immediately contact us, at any time, without hesitation. In any case, we plan to contact the patient by telephone for a follow-up status report regarding this interventional procedure.  Comments:  No additional relevant information.  Plan of Care    Imaging Orders     DG C-Arm 1-60 Min-No Report  Procedure Orders     LUMBAR FACET(MEDIAL BRANCH NERVE BLOCK) MBNB  Medications ordered for procedure: Meds ordered this encounter  Medications  . lidocaine (XYLOCAINE) 2 % (with pres) injection 400 mg  . DISCONTD: midazolam (VERSED) 5 MG/5ML injection 1-2 mg    Make sure Flumazenil is available in the pyxis when using this medication. If oversedation occurs, administer 0.2 mg IV over 15 sec. If after 45 sec no response, administer 0.2 mg again over 1 min; may repeat  at 1  min intervals; not to exceed 4 doses (1 mg)  . DISCONTD: fentaNYL (SUBLIMAZE) injection 25-50 mcg    Make sure Narcan is available in the pyxis when using this medication. In the event of respiratory depression (RR< 8/min): Titrate NARCAN (naloxone) in increments of 0.1 to 0.2 mg IV at 2-3 minute intervals, until desired degree of reversal.  . DISCONTD: lactated ringers infusion 1,000 mL  . ropivacaine (PF) 2 mg/mL (0.2%) (NAROPIN) injection 18 mL  . triamcinolone acetonide (KENALOG-40) injection 80 mg   Medications administered: We administered lidocaine, ropivacaine (PF) 2 mg/mL (0.2%), and triamcinolone acetonide.  See the medical record for exact dosing, route, and time of administration.  New Prescriptions   No medications on file   Disposition: Discharge home  Discharge Date & Time: 11/23/2017; 0910 hrs.   Physician-requested Follow-up: Return for post-procedure eval (2 wks), w/ Dr. Dossie Arbour.  Future Appointments  Date Time Provider [place]  12/08/2017  2:00 PM Milinda Pointer, MD [facility] None  12/31/2017  3:30 PM Bernardo Heater, Ronda Fairly, MD [facility] None  04/18/2018  9:00 AM Jerrol Banana., MD [facility] None   Primary Care Physician: Jerrol Banana., MD Location: [facility] Note by: Gaspar Cola, MD Date: 11/23/2017; Time: 9:31 AM  Disclaimer:  Medicine is not an exact science. The only guarantee in medicine is that nothing is guaranteed. It is important to note that the decision to proceed with this intervention was based on the information collected from the patient. The Data and conclusions were drawn from the patient's questionnaire, the interview, and the physical examination. Because the information was provided in large part by the patient, it cannot be guaranteed that it has not been purposely or unconsciously manipulated. Every effort has been made to obtain as much relevant data as possible for this evaluation. It is  important to note that the conclusions that lead to this procedure are derived in large part from the available data. Always take into account that the treatment will also be dependent on availability of resources and existing treatment guidelines, considered by other Pain Management Practitioners as being common knowledge and practice, at the time of the intervention. For Medico-Legal purposes, it is also important to point out that variation in procedural techniques and pharmacological choices are the acceptable norm. The indications, contraindications, technique, and results of the above procedure should only be interpreted and judged by a Board-Certified Interventional Pain Specialist with extensive familiarity and expertise in the same exact procedure and technique.

## 2017-11-22 NOTE — Patient Instructions (Addendum)
____________________________________________________________________________________________  Post-Procedure Discharge Instructions  Instructions:  Apply ice: Fill a plastic sandwich bag with crushed ice. Cover it with a small towel and apply to injection site. Apply for 15 minutes then remove x 15 minutes. Repeat sequence on day of procedure, until you go to bed. The purpose is to minimize swelling and discomfort after procedure.  Apply heat: Apply heat to procedure site starting the day following the procedure. The purpose is to treat any soreness and discomfort from the procedure.  Food intake: Start with clear liquids (like water) and advance to regular food, as tolerated.   Physical activities: Keep activities to a minimum for the first 8 hours after the procedure.   Driving: If you have received any sedation, you are not allowed to drive for 24 hours after your procedure.  Blood thinner: Restart your blood thinner 6 hours after your procedure. (Only for those taking blood thinners)  Insulin: As soon as you can eat, you may resume your normal dosing schedule. (Only for those taking insulin)  Infection prevention: Keep procedure site clean and dry.  Post-procedure Pain Diary: Extremely important that this be done correctly and accurately. Recorded information will be used to determine the next step in treatment.  Pain evaluated is that of treated area only. Do not include pain from an untreated area.  Complete every hour, on the hour, for the initial 8 hours. Set an alarm to help you do this part accurately.  Do not go to sleep and have it completed later. It will not be accurate.  Follow-up appointment: Keep your follow-up appointment after the procedure. Usually 2 weeks for most procedures. (6 weeks in the case of radiofrequency.) Bring you pain diary.   Expect:  From numbing medicine (AKA: Local Anesthetics): Numbness or decrease in pain.  Onset: Full effect within 15  minutes of injected.  Duration: It will depend on the type of local anesthetic used. On the average, 1 to 8 hours.   From steroids: Decrease in swelling or inflammation. Once inflammation is improved, relief of the pain will follow.  Onset of benefits: Depends on the amount of swelling present. The more swelling, the longer it will take for the benefits to be seen. In some cases, up to 10 days.  Duration: Steroids will stay in the system x 2 weeks. Duration of benefits will depend on multiple posibilities including persistent irritating factors.  Occasional side-effects: Facial flushing, cramps (if present, drink Gatorade and take over-the-counter Magnesium 450-500 mg once to twice a day).  From procedure: Some discomfort is to be expected once the numbing medicine wears off. This should be minimal if ice and heat are applied as instructed.  Call if:  You experience numbness and weakness that gets worse with time, as opposed to wearing off.  New onset bowel or bladder incontinence. (This applies to Spinal procedures only)  Emergency Numbers:  Durning business hours (Monday - Thursday, 8:00 AM - 4:00 PM) (Friday, 9:00 AM - 12:00 Noon): [phone]  After hours: [phone] ____________________________________________________________________________________________   Pain Management Discharge Instructions  General Discharge Instructions :  If you need to reach your doctor call: Monday-Friday 8:00 am - 4:00 pm at [phone] or toll free [phone].  After clinic hours [phone] to have operator reach doctor.  Bring all of your medication bottles to all your appointments in the pain clinic.  To cancel or reschedule your appointment with Pain Management please remember to call 24 hours in advance to avoid a fee.  Refer to the educational materials which you have been given on: General Risks, I had my Procedure. Discharge Instructions, Post Sedation.  Post Procedure  Instructions:  The drugs you were given will stay in your system until tomorrow, so for the next 24 hours you should not drive, make any legal decisions or drink any alcoholic beverages.  You may eat anything you prefer, but it is better to start with liquids then soups and crackers, and gradually work up to solid foods.  Please notify your doctor immediately if you have any unusual bleeding, trouble breathing or pain that is not related to your normal pain.  Depending on the type of procedure that was done, some parts of your body may feel week and/or numb.  This usually clears up by tonight or the next day.  Walk with the use of an assistive device or accompanied by an adult for the 24 hours.  You may use ice on the affected area for the first 24 hours.  Put ice in a Ziploc bag and cover with a towel and place against area 15 minutes on 15 minutes off.  You may switch to heat after 24 hours.Facet Blocks Patient Information  Description: The facets are joints in the spine between the vertebrae.  Like any joints in the body, facets can become irritated and painful.  Arthritis can also effect the facets.  By injecting steroids and local anesthetic in and around these joints, we can temporarily block the nerve supply to them.  Steroids act directly on irritated nerves and tissues to reduce selling and inflammation which often leads to decreased pain.  Facet blocks may be done anywhere along the spine from the neck to the low back depending upon the location of your pain.   After numbing the skin with local anesthetic (like Novocaine), a small needle is passed onto the facet joints under x-ray guidance.  You may experience a sensation of pressure while this is being done.  The entire block usually lasts about 15-25 minutes.   Conditions which may be treated by facet blocks:   Low back/buttock pain  Neck/shoulder pain  Certain types of headaches  Preparation for the injection:  1. Do not eat  any solid food or dairy products within 8 hours of your appointment. 2. You may drink clear liquid up to 3 hours before appointment.  Clear liquids include water, black coffee, juice or soda.  No milk or cream please. 3. You may take your regular medication, including pain medications, with a sip of water before your appointment.  Diabetics should hold regular insulin (if taken separately) and take 1/2 normal NPH dose the morning of the procedure.  Carry some sugar containing items with you to your appointment. 4. A driver must accompany you and be prepared to drive you home after your procedure. 5. Bring all your current medications with you. 6. An IV may be inserted and sedation may be given at the discretion of the physician. 7. A blood pressure cuff, EKG and other monitors will often be applied during the procedure.  Some patients may need to have extra oxygen administered for a short period. 8. You will be asked to provide medical information, including your allergies and medications, prior to the procedure.  We must know immediately if you are taking blood thinners (like Coumadin/Warfarin) or if you are allergic to IV iodine contrast (dye).  We must know if you could possible be pregnant.  Possible side-effects:   Bleeding from needle  site  Infection (rare, may require surgery)  Nerve injury (rare)  Numbness & tingling (temporary)  Difficulty urinating (rare, temporary)  Spinal headache (a headache worse with upright posture)  Light-headedness (temporary)  Pain at injection site (serveral days)  Decreased blood pressure (rare, temporary)  Weakness in arm/leg (temporary)  Pressure sensation in back/neck (temporary)   Call if you experience:   Fever/chills associated with headache or increased back/neck pain  Headache worsened by an upright position  New onset, weakness or numbness of an extremity below the injection site  Hives or difficulty breathing (go to the  emergency room)  Inflammation or drainage at the injection site(s)  Severe back/neck pain greater than usual  New symptoms which are concerning to you  Please note:  Although the local anesthetic injected can often make your back or neck feel good for several hours after the injection, the pain will likely return. It takes 3-7 days for steroids to work.  You may not notice any pain relief for at least one week.  If effective, we will often do a series of 2-3 injections spaced 3-6 weeks apart to maximally decrease your pain.  After the initial series, you may be a candidate for a more permanent nerve block of the facets.  If you have any questions, please call #[phone] [facility]

## 2017-11-23 ENCOUNTER — Ambulatory Visit (HOSPITAL_BASED_OUTPATIENT_CLINIC_OR_DEPARTMENT_OTHER): Payer: BC Managed Care – PPO | Admitting: Pain Medicine

## 2017-11-23 ENCOUNTER — Other Ambulatory Visit: Payer: Self-pay

## 2017-11-23 ENCOUNTER — Ambulatory Visit
Admission: RE | Admit: 2017-11-23 | Discharge: 2017-11-23 | Disposition: A | Discharge status: Home / Self Care | Payer: BC Managed Care – PPO | Source: Ambulatory Visit | Attending: Pain Medicine | Admitting: Pain Medicine

## 2017-11-23 VITALS — BP 120/72 | HR 58 | Temp 98.0°F | Resp 17 | Ht 71.0 in | Wt 330.0 lb

## 2017-11-23 DIAGNOSIS — Z7982 Long term (current) use of aspirin: Secondary | ICD-10-CM | POA: Diagnosis not present

## 2017-11-23 DIAGNOSIS — G8929 Other chronic pain: Secondary | ICD-10-CM

## 2017-11-23 DIAGNOSIS — Z79899 Other long term (current) drug therapy: Secondary | ICD-10-CM | POA: Insufficient documentation

## 2017-11-23 DIAGNOSIS — M5442 Lumbago with sciatica, left side: Secondary | ICD-10-CM

## 2017-11-23 DIAGNOSIS — M47817 Spondylosis without myelopathy or radiculopathy, lumbosacral region: Secondary | ICD-10-CM | POA: Insufficient documentation

## 2017-11-23 DIAGNOSIS — M47816 Spondylosis without myelopathy or radiculopathy, lumbar region: Secondary | ICD-10-CM

## 2017-11-23 MED ORDER — ROPIVACAINE HCL 2 MG/ML IJ SOLN
18.0000 mL | Freq: Once | INTRAMUSCULAR | Status: AC
  Administered 2017-11-23: 18 mL via PERINEURAL
  Filled 2017-11-23: qty 20

## 2017-11-23 MED ORDER — TRIAMCINOLONE ACETONIDE 40 MG/ML IJ SUSP
80.0000 mg | Freq: Once | INTRAMUSCULAR | Status: AC
  Administered 2017-11-23: 80 mg
  Filled 2017-11-23: qty 2

## 2017-11-23 MED ORDER — MIDAZOLAM HCL 5 MG/5ML IJ SOLN: 1.0000 mg | INTRAMUSCULAR | Status: DC | PRN

## 2017-11-23 MED ORDER — LIDOCAINE HCL 2 % IJ SOLN
20.0000 mL | Freq: Once | INTRAMUSCULAR | Status: AC
  Administered 2017-11-23: 400 mg
  Filled 2017-11-23: qty 40

## 2017-11-23 MED ORDER — FENTANYL CITRATE (PF) 100 MCG/2ML IJ SOLN: 25.0000 ug | INTRAMUSCULAR | Status: DC | PRN

## 2017-11-23 MED ORDER — LACTATED RINGERS IV SOLN: 1000.0000 mL | Freq: Once | INTRAVENOUS | Status: DC

## 2017-11-24 ENCOUNTER — Telehealth: Payer: Self-pay | Admitting: *Deleted

## 2017-11-24 NOTE — Telephone Encounter (Signed)
Patient verbalizes no questions or concerns re; procedure on yesterday. 

## 2017-12-07 NOTE — Progress Notes (Deleted)
Patient's Name: Terry Horn.  MRN: [id number]  Referring Provider: Jerrol Banana.,*  DOB: 1960-06-30  PCP: Jerrol Banana., MD  DOS: 12/08/2017  Note by: Gaspar Cola, MD  Service setting: Ambulatory outpatient  Specialty: Interventional Pain Management  Location: [facility] (AMB) Pain Management Facility    Patient type: Established  ("FAST-TRACK" Evaluation)   Primary Reason(s) for Visit: Encounter for post-procedure evaluation of chronic illness with mild to moderate exacerbation CC: No chief complaint on file.  HPI  Terry Horn is a 57 y.o. year old, male patient, who comes today for a post-procedure evaluation. He has Arthritis; Body mass index (BMI) of 50-59.9 in adult [facility]); Essential (primary) hypertension; Adiposity; Apnea, sleep; Avitaminosis D; Chest pressure; Paresthesia; Encounter for screening colonoscopy; History of colonic polyps; Mixed hyperlipidemia; Hyperglycemia; Chronic low back pain (Primary Area of Pain) (Bilateral) w/ sciatica (Left); DDD (degenerative disc disease), lumbar; Lumbar spondylosis; DDD (degenerative disc disease), cervical; Cervical spondylolysis; Chronic knee pain (Bilateral); Osteoarthritis of knee (Secondary) (Bilateral); Congenital Lumbar central spinal stenosis; Cervical central spinal stenosis; Carpal tunnel syndrome (Bilateral); Abnormal nerve conduction studies; Osteoarthritis of spine with radiculopathy, lumbosacral region; Osteoarthritis of facet joint of lumbar spine; Osteoarthritis involving multiple joints on both sides of body; Cervicalgia; Chronic neck pain; Numbness of upper extremity (Bilateral); Numbness of lower extremity (Left); Spondylosis without myelopathy or radiculopathy, lumbosacral region; Lumbar facet syndrome (Midline pain pattern) (Bilateral); Lumbar facet arthropathy (L3-4, L4-L5, and L5-S1) (Bilateral); and Chronic midline low back pain without sciatica on their problem list. His primarily concern today is the No  chief complaint on file.  Pain Assessment: Location:     Radiating:   Onset:   Duration:   Quality:   Severity:  /10 (subjective, self-reported pain score)  Note: Reported level is compatible with observation.                         When using our objective Pain Scale, levels between 6 and 10/10 are said to belong in an emergency room, as it progressively worsens from a 6/10, described as severely limiting, requiring emergency care not usually available at an outpatient pain management facility. At a 6/10 level, communication becomes difficult and requires great effort. Assistance to reach the emergency department may be required. Facial flushing and profuse sweating along with potentially dangerous increases in heart rate and blood pressure will be evident. Effect on ADL:   Timing:   Modifying factors:   BP:    HR:    Terry Horn comes in today for post-procedure evaluation.  Further details on both, my assessment(s), as well as the proposed treatment plan, please see below.  Post-Procedure Assessment  11/23/2017 Procedure: Diagnostic bilateral lumbar facet block #2 under fluoroscopic guidance and IV sedation  Pre-procedure pain score:  1/10 Post-procedure pain score: 1/10 No initial benefit, possible due to rapid discharge after no sedation procedure, without enough time to allow full onset of block. Influential Factors: BMI:   Intra-procedural challenges: None observed.         Assessment challenges: None detected.              Reported side-effects: None.        Post-procedural adverse reactions or complications: None reported         Sedation: No sedation used. When no sedatives are used, the analgesic levels obtained are directly associated to the effectiveness of the local anesthetics. However, when sedation is provided, the level of analgesia  obtained during the initial 1 hour following the intervention, is believed to be the result of a combination of factors. These factors may  include, but are not limited to: 1. The effectiveness of the local anesthetics used. 2. The effects of the analgesic(s) and/or anxiolytic(s) used. 3. The degree of discomfort experienced by the patient at the time of the procedure. 4. The patients ability and reliability in recalling and recording the events. 5. The presence and influence of possible secondary gains and/or psychosocial factors. Reported result: Relief experienced during the 1st hour after the procedure:   (Ultra-Short Term Relief)            Interpretative annotation: Clinically appropriate result. No IV Analgesic or Anxiolytic given, therefore benefits are completely due to Local Anesthetic effects.          Effects of local anesthetic: The analgesic effects attained during this period are directly associated to the localized infiltration of local anesthetics and therefore cary significant diagnostic value as to the etiological location, or anatomical origin, of the pain. Expected duration of relief is directly dependent on the pharmacodynamics of the local anesthetic used. Long-acting (4-6 hours) anesthetics used.  Reported result: Relief during the next 4 to 6 hour after the procedure:   (Short-Term Relief)            Interpretative annotation: Clinically appropriate result. Analgesia during this period is likely to be Local Anesthetic-related.          Long-term benefit: Defined as the period of time past the expected duration of local anesthetics (1 hour for short-acting and 4-6 hours for long-acting). With the possible exception of prolonged sympathetic blockade from the local anesthetics, benefits during this period are typically attributed to, or associated with, other factors such as analgesic sensory neuropraxia, antiinflammatory effects, or beneficial biochemical changes provided by agents other than the local anesthetics.  Reported result: Extended relief following procedure:   (Long-Term Relief)            Interpretative  annotation: Clinically possible results. Good relief. No permanent benefit expected. Inflammation plays a part in the etiology to the pain.          Current benefits: Defined as reported results that persistent at this point in time.   Analgesia: *** %            Function: Somewhat improved ROM: Somewhat improved Interpretative annotation: Recurrence of symptoms. No permanent benefit expected. Effective diagnostic intervention.          Interpretation: Results would suggest a successful diagnostic intervention.                  Plan:  Please see "Plan of Care" for details.                Laboratory Chemistry  Inflammation Markers (CRP: Acute Phase) (ESR: Chronic Phase) No results found for: CRP, ESRSEDRATE, LATICACIDVEN                       Renal Markers Lab Results  Component Value Date   BUN 10 04/15/2017   CREATININE 0.74 (L) 04/15/2017   BCR 14 04/15/2017   GFRAA 119 04/15/2017   GFRNONAA 103 04/15/2017                             Hepatic Markers Lab Results  Component Value Date   AST 20 04/15/2017   ALT 22 04/15/2017  ALBUMIN 4.4 04/15/2017                        Neuropathy Markers Lab Results  Component Value Date   HGBA1C 5.5 02/21/2016                        Hematology Parameters Lab Results  Component Value Date   PLT 284 04/15/2017   HGB 13.6 04/15/2017   HCT 40.9 04/15/2017                        CV Markers No results found for: BNP, CKTOTAL, CKMB, TROPONINI                       Note: Lab results reviewed.  Recent Imaging Results   Results for orders placed in visit on 11/23/17  DG C-Arm 1-60 Min-No Report   Narrative Fluoroscopy was utilized by the requesting physician.  No radiographic  interpretation.    Interpretation Report: Fluoroscopy was used during the procedure to assist with needle guidance. The images were interpreted intraoperatively by the requesting physician.  Meds   Current Outpatient Medications:  .  aspirin 81 MG  tablet, Take 81 mg by mouth daily. , Disp: , Rfl:  .  Azelastine-Fluticasone (DYMISTA) 137-50 MCG/ACT SUSP, Place 2 sprays into the nose daily., Disp: 1 Bottle, Rfl: 3 .  cetirizine-pseudoephedrine (ZYRTEC-D ALLERGY & CONGESTION) 5-120 MG tablet, Take 1 tablet by mouth 2 (two) times daily. , Disp: , Rfl:  .  Cholecalciferol 1000 UNITS tablet, Take 1,000 Units by mouth daily. , Disp: , Rfl:  .  finasteride (PROSCAR) 5 MG tablet, TAKE ONE TABLET BY MOUTH DAILY, Disp: 30 tablet, Rfl: 1`1 .  finasteride (PROSCAR) 5 MG tablet, TAKE ONE TABLET BY MOUTH DAILY, Disp: 30 tablet, Rfl: 11 .  gabapentin (NEURONTIN) 100 MG capsule, Take 100 mg by mouth 3 (three) times daily., Disp: , Rfl:  .  losartan-hydrochlorothiazide (HYZAAR) 100-12.5 MG tablet, Take 1 tablet by mouth daily., Disp: 90 tablet, Rfl: 3 .  Misc Natural Products (GLUCOSAMINE CHONDROITIN COMPLX) TABS, Take 1 tablet by mouth daily. , Disp: , Rfl:  .  Multiple Vitamin tablet, Take 1 tablet by mouth daily. , Disp: , Rfl:  .  OMEGA-3 FATTY ACIDS PO, Take 1 tablet by mouth daily. , Disp: , Rfl:  .  pravastatin (PRAVACHOL) 20 MG tablet, Take 20 mg by mouth daily., Disp: , Rfl:  .  tamsulosin (FLOMAX) 0.4 MG CAPS capsule, TAKE ONE CAPSULE BY MOUTH DAILY, Disp: 90 capsule, Rfl: 3 .  Turmeric, Curcuma [place]a, POWD, Take by mouth. , Disp: , Rfl:  .  vitamin C (ASCORBIC ACID) 500 MG tablet, Take 500 mg by mouth daily. , Disp: , Rfl:  .  vitamin E 400 UNIT capsule, Take 400 Units by mouth daily. , Disp: , Rfl:   ROS  Constitutional: Denies any fever or chills Gastrointestinal: No reported hemesis, hematochezia, vomiting, or acute GI distress Musculoskeletal: Denies any acute onset joint swelling, redness, loss of ROM, or weakness Neurological: No reported episodes of acute onset apraxia, aphasia, dysarthria, agnosia, amnesia, paralysis, loss of coordination, or loss of consciousness  Allergies  Terry Horn has No Known Allergies.  [place]  Drug: Mr.  Horn  reports that he does not use drugs. Alcohol:  reports that he drinks alcohol. Tobacco:  reports that he quit smoking about 21 years ago. He  quit after 22.00 years of use. He has never used smokeless tobacco. Medical:  has a past medical history of Anxiety, Arthritis, Cancer ([place]), GERD (gastroesophageal reflux disease), Hypertension, Pneumonia, and Sleep apnea. Surgical: Terry Horn  has a past surgical history that includes Tonsillectomy and Excision of skin tag (N/A, 02/06/2016). Family: family history includes Bipolar disorder in his mother; Depression in his sister; Prostate cancer in his paternal grandfather and paternal uncle; Thyroid disease in his mother.  Constitutional Exam  General appearance: Well nourished, well developed, and well hydrated. In no apparent acute distress There were no vitals filed for this visit. BMI Assessment: Estimated body mass index is 46.03 kg/m as calculated from the following:   Height as of 11/23/17: '5\' 11"'  (1.803 m).   Weight as of 11/23/17: 330 lb (149.7 kg).  BMI interpretation table: BMI level Category Range association with higher incidence of chronic pain  <18 kg/m2 Underweight   18.5-24.9 kg/m2 Ideal body weight   25-29.9 kg/m2 Overweight Increased incidence by 20%  30-34.9 kg/m2 Obese (Class I) Increased incidence by 68%  35-39.9 kg/m2 Severe obesity (Class II) Increased incidence by 136%  >40 kg/m2 Extreme obesity (Class III) Increased incidence by 254%   Patient's current BMI Ideal Body weight  There is no height or weight on file to calculate BMI. Patient weight not recorded   BMI Readings from Last 4 Encounters:  11/23/17 46.03 kg/m  09/27/17 46.03 kg/m  09/09/17 46.03 kg/m  08/17/17 46.86 kg/m   Wt Readings from Last 4 Encounters:  11/23/17 (!) 330 lb (149.7 kg)  09/27/17 (!) 330 lb (149.7 kg)  09/09/17 (!) 330 lb (149.7 kg)  08/17/17 (!) 336 lb (152.4 kg)  Psych/Mental status: Alert, oriented x 3 (person, place, &  time)       Eyes: PERLA Respiratory: No evidence of acute respiratory distress  Cervical Spine Area Exam  Skin & Axial Inspection: No masses, redness, edema, swelling, or associated skin lesions Alignment: Symmetrical Functional ROM: Unrestricted ROM      Stability: No instability detected Muscle Tone/Strength: Functionally intact. No obvious neuro-muscular anomalies detected. Sensory (Neurological): Unimpaired Palpation: No palpable anomalies              Upper Extremity (UE) Exam    Side: Right upper extremity  Side: Left upper extremity  Skin & Extremity Inspection: Skin color, temperature, and hair growth are WNL. No peripheral edema or cyanosis. No masses, redness, swelling, asymmetry, or associated skin lesions. No contractures.  Skin & Extremity Inspection: Skin color, temperature, and hair growth are WNL. No peripheral edema or cyanosis. No masses, redness, swelling, asymmetry, or associated skin lesions. No contractures.  Functional ROM: Unrestricted ROM          Functional ROM: Unrestricted ROM          Muscle Tone/Strength: Functionally intact. No obvious neuro-muscular anomalies detected.  Muscle Tone/Strength: Functionally intact. No obvious neuro-muscular anomalies detected.  Sensory (Neurological): Unimpaired          Sensory (Neurological): Unimpaired          Palpation: No palpable anomalies              Palpation: No palpable anomalies              Provocative Test(s):  Phalen's test: deferred Tinel's test: deferred Apley's scratch test (touch opposite shoulder):  Action 1 (Across chest): deferred Action 2 (Overhead): deferred Action 3 (LB reach): deferred   Provocative Test(s):  Phalen's test: deferred Tinel's test: deferred  Apley's scratch test (touch opposite shoulder):  Action 1 (Across chest): deferred Action 2 (Overhead): deferred Action 3 (LB reach): deferred    Thoracic Spine Area Exam  Skin & Axial Inspection: No masses, redness, or swelling Alignment:  Symmetrical Functional ROM: Unrestricted ROM Stability: No instability detected Muscle Tone/Strength: Functionally intact. No obvious neuro-muscular anomalies detected. Sensory (Neurological): Unimpaired Muscle strength & Tone: No palpable anomalies  Lumbar Spine Area Exam  Skin & Axial Inspection: No masses, redness, or swelling Alignment: Symmetrical Functional ROM: Unrestricted ROM       Stability: No instability detected Muscle Tone/Strength: Functionally intact. No obvious neuro-muscular anomalies detected. Sensory (Neurological): Unimpaired Palpation: No palpable anomalies       Provocative Tests: Hyperextension/rotation test: deferred today       Lumbar quadrant test (Kemp's test): deferred today       Lateral bending test: deferred today       Patrick's Maneuver: deferred today                   FABER test: deferred today                   S-I anterior distraction/compression test: deferred today         S-I lateral compression test: deferred today         S-I Thigh-thrust test: deferred today         S-I Gaenslen's test: deferred today          Gait & Posture Assessment  Ambulation: Unassisted Gait: Relatively normal for age and body habitus Posture: WNL   Lower Extremity Exam    Side: Right lower extremity  Side: Left lower extremity  Stability: No instability observed          Stability: No instability observed          Skin & Extremity Inspection: Skin color, temperature, and hair growth are WNL. No peripheral edema or cyanosis. No masses, redness, swelling, asymmetry, or associated skin lesions. No contractures.  Skin & Extremity Inspection: Skin color, temperature, and hair growth are WNL. No peripheral edema or cyanosis. No masses, redness, swelling, asymmetry, or associated skin lesions. No contractures.  Functional ROM: Unrestricted ROM                  Functional ROM: Unrestricted ROM                  Muscle Tone/Strength: Functionally intact. No obvious  neuro-muscular anomalies detected.  Muscle Tone/Strength: Functionally intact. No obvious neuro-muscular anomalies detected.  Sensory (Neurological): Unimpaired  Sensory (Neurological): Unimpaired  Palpation: No palpable anomalies  Palpation: No palpable anomalies   Assessment  Primary Diagnosis & Pertinent Problem List: The primary encounter diagnosis was Chronic low back pain (Primary Area of Pain) (Bilateral) w/ sciatica (Left). Diagnoses of Chronic knee pain (Bilateral), Chronic neck pain, Lumbar facet syndrome (Midline pain pattern) (Bilateral), Spondylosis without myelopathy or radiculopathy, lumbosacral region, Lumbar spondylosis, Osteoarthritis of facet joint of lumbar spine, and Osteoarthritis of knee (Secondary) (Bilateral) were also pertinent to this visit.  Status Diagnosis  Controlled Controlled Controlled 1. Chronic low back pain (Primary Area of Pain) (Bilateral) w/ sciatica (Left)   2. Chronic knee pain (Bilateral)   3. Chronic neck pain   4. Lumbar facet syndrome (Midline pain pattern) (Bilateral)   5. Spondylosis without myelopathy or radiculopathy, lumbosacral region   6. Lumbar spondylosis   7. Osteoarthritis of facet joint of lumbar spine   8. Osteoarthritis of knee (  Secondary) (Bilateral)     Problems updated and reviewed during this visit: No problems updated. Plan of Care  Pharmacotherapy (Medications Ordered): No orders of the defined types were placed in this encounter.  Medications administered today: Terry T. Charise Killian. "Terry Horn" had no medications administered during this visit.  Procedure Orders    No procedure(s) ordered today   Lab Orders  No laboratory test(s) ordered today   Imaging Orders  No imaging studies ordered today   Referral Orders  No referral(s) requested today   Interventional management options: Planned, scheduled, and/or pending:   None at this time   Considering:   Palliative bilateral lumbar facet blocks under fluoroscopic  guidance and IV sedation  Diagnostic bilateral IA Knee injection w/ L.A. & Steroids  Possible series of 5 bilateral IA Hyalgan knee injections    Palliative PRN treatment(s):   Palliative bilateral lumbar facet blocks under fluoroscopic guidance and IV sedation    Provider-requested follow-up: No follow-ups on file.  Future Appointments  Date Time Provider Vincent  12/08/2017  2:00 PM Milinda Pointer, MD [facility] None  04/18/2018  9:00 AM Jerrol Banana., MD [facility] None   Primary Care Physician: Jerrol Banana., MD Location: [facility] Out[facility] Note by: Gaspar Cola, MD Date: 12/08/2017; Time: 10:00 PM

## 2017-12-08 ENCOUNTER — Ambulatory Visit: Payer: BC Managed Care – PPO | Admitting: Pain Medicine

## 2017-12-09 NOTE — Progress Notes (Signed)
Patient's Name: Terry Horn.  MRN: [id number]  Referring Provider: Jerrol Banana.,*  DOB: [birth year]  PCP: Jerrol Banana., MD  DOS: 12/13/2017  Note by: Gaspar Cola, MD  Service setting: Ambulatory outpatient  Specialty: Interventional Pain Management  Location: [facility] (AMB) Pain Management Facility    Patient type: Established   Primary Reason(s) for Visit: Encounter for post-procedure evaluation of chronic illness with mild to moderate exacerbation CC: Back Pain (lower)  HPI  Terry Horn is a 57 y.o. year old, male patient, who comes today for a post-procedure evaluation. He has Arthritis; Body mass index (BMI) of 50-59.9 in adult [facility]); Essential (primary) hypertension; Adiposity; Apnea, sleep; Avitaminosis D; Chest pressure; Paresthesia; Encounter for screening colonoscopy; History of colonic polyps; Mixed hyperlipidemia; Hyperglycemia; Chronic low back pain (Primary Area of Pain) (Bilateral) w/ sciatica (Left); DDD (degenerative disc disease), lumbar; Lumbar spondylosis; DDD (degenerative disc disease), cervical; Cervical spondylolysis; Chronic knee pain (Bilateral); Osteoarthritis of knee (Secondary) (Bilateral); Congenital Lumbar central spinal stenosis; Cervical central spinal stenosis; Carpal tunnel syndrome (Bilateral); Abnormal nerve conduction studies; Osteoarthritis of spine with radiculopathy, lumbosacral region; Osteoarthritis of facet joint of lumbar spine; Osteoarthritis involving multiple joints on both sides of body; Cervicalgia; Chronic neck pain; Numbness of upper extremity (Bilateral); Numbness of lower extremity (Left); Spondylosis without myelopathy or radiculopathy, lumbosacral region; Lumbar facet syndrome (Midline pain pattern) (Bilateral); Lumbar facet arthropathy (L3-4, L4-L5, and L5-S1) (Bilateral); and Chronic midline low back pain without sciatica on their problem list. His primarily concern today is the Back Pain (lower)  Pain  Assessment: Location: Lower Back Radiating: occasionally goes down both legs to the feet Onset: More than a month ago Duration: Chronic pain Quality: Aching, Sharp, Nagging Severity: 1 /10 (subjective, self-reported pain score)  Note: Reported level is compatible with observation.                         When using our objective Pain Scale, levels between 6 and 10/10 are said to belong in an emergency room, as it progressively worsens from a 6/10, described as severely limiting, requiring emergency care not usually available at an outpatient pain management facility. At a 6/10 level, communication becomes difficult and requires great effort. Assistance to reach the emergency department may be required. Facial flushing and profuse sweating along with potentially dangerous increases in heart rate and blood pressure will be evident. Timing: Intermittent Modifying factors: lying down BP: (!) 113/92  HR: 70  Terry Horn comes in today for post-procedure evaluation.  Further details on both, my assessment(s), as well as the proposed treatment plan, please see below.  Post-Procedure Assessment  11/23/2017 Procedure: Diagnostic bilateral lumbar facet block #2 under fluoroscopic guidance and no sedation  Pre-procedure pain score:  1/10 Post-procedure pain score: 1/10 No initial benefit, possible due to rapid discharge after no sedation procedure, without enough time to allow full onset of block. Influential Factors: BMI: 46.03 kg/m Intra-procedural challenges: None observed.         Assessment challenges: None detected.              Reported side-effects: None.        Post-procedural adverse reactions or complications: None reported         Sedation: No sedation used. When no sedatives are used, the analgesic levels obtained are directly associated to the effectiveness of the local anesthetics. However, when sedation is provided, the level of analgesia obtained during the initial 1 hour  following  the intervention, is believed to be the result of a combination of factors. These factors may include, but are not limited to: 1. The effectiveness of the local anesthetics used. 2. The effects of the analgesic(s) and/or anxiolytic(s) used. 3. The degree of discomfort experienced by the patient at the time of the procedure. 4. The patients ability and reliability in recalling and recording the events. 5. The presence and influence of possible secondary gains and/or psychosocial factors. Reported result: Relief experienced during the 1st hour after the procedure: 100 % (Ultra-Short Term Relief)            Interpretative annotation: Clinically appropriate result. No IV Analgesic or Anxiolytic given, therefore benefits are completely due to Local Anesthetic effects.          Effects of local anesthetic: The analgesic effects attained during this period are directly associated to the localized infiltration of local anesthetics and therefore cary significant diagnostic value as to the etiological location, or anatomical origin, of the pain. Expected duration of relief is directly dependent on the pharmacodynamics of the local anesthetic used. Long-acting (4-6 hours) anesthetics used.  Reported result: Relief during the next 4 to 6 hour after the procedure: 100 % (Short-Term Relief)            Interpretative annotation: Clinically appropriate result. Analgesia during this period is likely to be Local Anesthetic-related.          Long-term benefit: Defined as the period of time past the expected duration of local anesthetics (1 hour for short-acting and 4-6 hours for long-acting). With the possible exception of prolonged sympathetic blockade from the local anesthetics, benefits during this period are typically attributed to, or associated with, other factors such as analgesic sensory neuropraxia, antiinflammatory effects, or beneficial biochemical changes provided by agents other than the local anesthetics.   Reported result: Extended relief following procedure: 100 %(lasted 2 weeks) (Long-Term Relief)            Interpretative annotation: Clinically possible results. Good relief. No permanent benefit expected. Inflammation plays a part in the etiology to the pain.          Current benefits: Defined as reported results that persistent at this point in time.   Analgesia: 50-75 %            Function: Somewhat improved ROM: Somewhat improved Interpretative annotation: Ongoing benefit. No permanent benefit expected. Effective therapeutic approach.          Interpretation: Results would suggest a successful diagnostic intervention.                  Plan:  Set up procedure as a PRN palliative treatment option for this patient.                Laboratory Chemistry  Inflammation Markers (CRP: Acute Phase) (ESR: Chronic Phase) No results found for: CRP, ESRSEDRATE, LATICACIDVEN                       Renal Markers Lab Results  Component Value Date   BUN 10 04/15/2017   CREATININE 0.74 (L) 04/15/2017   BCR 14 04/15/2017   GFRAA 119 04/15/2017   GFRNONAA 103 04/15/2017                             Hepatic Markers Lab Results  Component Value Date   AST 20 04/15/2017   ALT 22 04/15/2017  ALBUMIN 4.4 04/15/2017                        Neuropathy Markers Lab Results  Component Value Date   HGBA1C 5.5 02/21/2016                        Hematology Parameters Lab Results  Component Value Date   PLT 284 04/15/2017   HGB 13.6 04/15/2017   HCT 40.9 04/15/2017                        CV Markers No results found for: BNP, CKTOTAL, CKMB, TROPONINI                       Note: Lab results reviewed.  Recent Imaging Results   Results for orders placed in visit on 11/23/17  DG C-Arm 1-60 Min-No Report   Narrative Fluoroscopy was utilized by the requesting physician.  No radiographic  interpretation.    Interpretation Report: Fluoroscopy was used during the procedure to assist with needle  guidance. The images were interpreted intraoperatively by the requesting physician.  Meds   Current Outpatient Medications:  .  aspirin 81 MG tablet, Take 81 mg by mouth daily. , Disp: , Rfl:  .  Azelastine-Fluticasone (DYMISTA) 137-50 MCG/ACT SUSP, Place 2 sprays into the nose daily., Disp: 1 Bottle, Rfl: 3 .  cetirizine-pseudoephedrine (ZYRTEC-D ALLERGY & CONGESTION) 5-120 MG tablet, Take 1 tablet by mouth 2 (two) times daily. , Disp: , Rfl:  .  Cholecalciferol 1000 UNITS tablet, Take 1,000 Units by mouth daily. , Disp: , Rfl:  .  finasteride (PROSCAR) 5 MG tablet, TAKE ONE TABLET BY MOUTH DAILY, Disp: 30 tablet, Rfl: 1`1 .  finasteride (PROSCAR) 5 MG tablet, TAKE ONE TABLET BY MOUTH DAILY, Disp: 30 tablet, Rfl: 11 .  gabapentin (NEURONTIN) 100 MG capsule, Take 100 mg by mouth 3 (three) times daily., Disp: , Rfl:  .  losartan-hydrochlorothiazide (HYZAAR) 100-12.5 MG tablet, Take 1 tablet by mouth daily., Disp: 90 tablet, Rfl: 3 .  Misc Natural Products (GLUCOSAMINE CHONDROITIN COMPLX) TABS, Take 1 tablet by mouth daily. , Disp: , Rfl:  .  Multiple Vitamin tablet, Take 1 tablet by mouth daily. , Disp: , Rfl:  .  OMEGA-3 FATTY ACIDS PO, Take 1 tablet by mouth daily. , Disp: , Rfl:  .  pravastatin (PRAVACHOL) 20 MG tablet, Take 20 mg by mouth daily., Disp: , Rfl:  .  tamsulosin (FLOMAX) 0.4 MG CAPS capsule, TAKE ONE CAPSULE BY MOUTH DAILY, Disp: 90 capsule, Rfl: 3 .  Turmeric, Curcuma [place]a, POWD, Take by mouth. , Disp: , Rfl:  .  vitamin C (ASCORBIC ACID) 500 MG tablet, Take 500 mg by mouth daily. , Disp: , Rfl:  .  vitamin E 400 UNIT capsule, Take 400 Units by mouth daily. , Disp: , Rfl:   ROS  Constitutional: Denies any fever or chills Gastrointestinal: No reported hemesis, hematochezia, vomiting, or acute GI distress Musculoskeletal: Denies any acute onset joint swelling, redness, loss of ROM, or weakness Neurological: No reported episodes of acute onset apraxia, aphasia, dysarthria,  agnosia, amnesia, paralysis, loss of coordination, or loss of consciousness  Allergies  Terry Horn has No Known Allergies.  [place]  Drug: Terry Horn  reports that he does not use drugs. Alcohol:  reports that he drinks alcohol. Tobacco:  reports that he quit smoking about 21 years ago. He  quit after 22.00 years of use. He has never used smokeless tobacco. Medical:  has a past medical history of Anxiety, Arthritis, Cancer ([place]), GERD (gastroesophageal reflux disease), Hypertension, Pneumonia, and Sleep apnea. Surgical: Terry Horn  has a past surgical history that includes Tonsillectomy and Excision of skin tag (N/A, 02/06/2016). Family: family history includes Bipolar disorder in his mother; Depression in his sister; Prostate cancer in his paternal grandfather and paternal uncle; Thyroid disease in his mother.  Constitutional Exam  General appearance: Well nourished, well developed, and well hydrated. In no apparent acute distress Vitals:   12/13/17 0807  BP: (!) 113/92  Pulse: 70  Resp: 16  Temp: 98.3 F (36.8 C)  TempSrc: Oral  SpO2: 98%  Weight: (!) 330 lb (149.7 kg)  Height: '5\' 11"'  (1.803 m)   BMI Assessment: Estimated body mass index is 46.03 kg/m as calculated from the following:   Height as of this encounter: '5\' 11"'  (1.803 m).   Weight as of this encounter: 330 lb (149.7 kg).  BMI interpretation table: BMI level Category Range association with higher incidence of chronic pain  <18 kg/m2 Underweight   18.5-24.9 kg/m2 Ideal body weight   25-29.9 kg/m2 Overweight Increased incidence by 20%  30-34.9 kg/m2 Obese (Class I) Increased incidence by 68%  35-39.9 kg/m2 Severe obesity (Class II) Increased incidence by 136%  >40 kg/m2 Extreme obesity (Class III) Increased incidence by 254%   Patient's current BMI Ideal Body weight  Body mass index is 46.03 kg/m. Ideal body weight: 75.3 kg (166 lb 0.1 oz) Adjusted ideal body weight: 105.1 kg (231 lb 9.7 oz)   BMI Readings from Last  4 Encounters:  12/13/17 46.03 kg/m  11/23/17 46.03 kg/m  09/27/17 46.03 kg/m  09/09/17 46.03 kg/m   Wt Readings from Last 4 Encounters:  12/13/17 (!) 330 lb (149.7 kg)  11/23/17 (!) 330 lb (149.7 kg)  09/27/17 (!) 330 lb (149.7 kg)  09/09/17 (!) 330 lb (149.7 kg)  Psych/Mental status: Alert, oriented x 3 (person, place, & time)       Eyes: PERLA Respiratory: No evidence of acute respiratory distress  Cervical Spine Area Exam  Skin & Axial Inspection: No masses, redness, edema, swelling, or associated skin lesions Alignment: Symmetrical Functional ROM: Unrestricted ROM      Stability: No instability detected Muscle Tone/Strength: Functionally intact. No obvious neuro-muscular anomalies detected. Sensory (Neurological): Unimpaired Palpation: No palpable anomalies              Upper Extremity (UE) Exam    Side: Right upper extremity  Side: Left upper extremity  Skin & Extremity Inspection: Skin color, temperature, and hair growth are WNL. No peripheral edema or cyanosis. No masses, redness, swelling, asymmetry, or associated skin lesions. No contractures.  Skin & Extremity Inspection: Skin color, temperature, and hair growth are WNL. No peripheral edema or cyanosis. No masses, redness, swelling, asymmetry, or associated skin lesions. No contractures.  Functional ROM: Unrestricted ROM          Functional ROM: Unrestricted ROM          Muscle Tone/Strength: Functionally intact. No obvious neuro-muscular anomalies detected.  Muscle Tone/Strength: Functionally intact. No obvious neuro-muscular anomalies detected.  Sensory (Neurological): Unimpaired          Sensory (Neurological): Unimpaired          Palpation: No palpable anomalies              Palpation: No palpable anomalies  Provocative Test(s):  Phalen's test: deferred Tinel's test: deferred Apley's scratch test (touch opposite shoulder):  Action 1 (Across chest): deferred Action 2 (Overhead): deferred Action 3 (LB  reach): deferred   Provocative Test(s):  Phalen's test: deferred Tinel's test: deferred Apley's scratch test (touch opposite shoulder):  Action 1 (Across chest): deferred Action 2 (Overhead): deferred Action 3 (LB reach): deferred    Thoracic Spine Area Exam  Skin & Axial Inspection: No masses, redness, or swelling Alignment: Symmetrical Functional ROM: Unrestricted ROM Stability: No instability detected Muscle Tone/Strength: Functionally intact. No obvious neuro-muscular anomalies detected. Sensory (Neurological): Unimpaired Muscle strength & Tone: No palpable anomalies  Lumbar Spine Area Exam  Skin & Axial Inspection: No masses, redness, or swelling Alignment: Symmetrical Functional ROM: Improved after treatment       Stability: No instability detected Muscle Tone/Strength: Functionally intact. No obvious neuro-muscular anomalies detected. Sensory (Neurological): Unimpaired Palpation: No palpable anomalies       Provocative Tests: Hyperextension/rotation test: deferred today       Lumbar quadrant test (Kemp's test): deferred today       Lateral bending test: deferred today       Patrick's Maneuver: deferred today                   FABER test: deferred today                   S-I anterior distraction/compression test: deferred today         S-I lateral compression test: deferred today         S-I Thigh-thrust test: deferred today         S-I Gaenslen's test: deferred today          Gait & Posture Assessment  Ambulation: Unassisted Gait: Relatively normal for age and body habitus Posture: WNL   Lower Extremity Exam    Side: Right lower extremity  Side: Left lower extremity  Stability: No instability observed          Stability: No instability observed          Skin & Extremity Inspection: Skin color, temperature, and hair growth are WNL. No peripheral edema or cyanosis. No masses, redness, swelling, asymmetry, or associated skin lesions. No contractures.  Skin &  Extremity Inspection: Skin color, temperature, and hair growth are WNL. No peripheral edema or cyanosis. No masses, redness, swelling, asymmetry, or associated skin lesions. No contractures.  Functional ROM: Unrestricted ROM                  Functional ROM: Unrestricted ROM                  Muscle Tone/Strength: Functionally intact. No obvious neuro-muscular anomalies detected.  Muscle Tone/Strength: Functionally intact. No obvious neuro-muscular anomalies detected.  Sensory (Neurological): Unimpaired  Sensory (Neurological): Unimpaired  Palpation: No palpable anomalies  Palpation: No palpable anomalies   Assessment  Primary Diagnosis & Pertinent Problem List: The primary encounter diagnosis was Chronic low back pain (Primary Area of Pain) (Bilateral) w/ sciatica (Left). Diagnoses of Lumbar facet syndrome (Midline pain pattern) (Bilateral), Lumbar facet arthropathy (L3-4, L4-L5, and L5-S1) (Bilateral), Lumbar spondylosis, Osteoarthritis of facet joint of lumbar spine, and Spondylosis without myelopathy or radiculopathy, lumbosacral region were also pertinent to this visit.  Status Diagnosis  Improved Improved Stable 1. Chronic low back pain (Primary Area of Pain) (Bilateral) w/ sciatica (Left)   2. Lumbar facet syndrome (Midline pain pattern) (Bilateral)   3. Lumbar facet arthropathy (  L3-4, L4-L5, and L5-S1) (Bilateral)   4. Lumbar spondylosis   5. Osteoarthritis of facet joint of lumbar spine   6. Spondylosis without myelopathy or radiculopathy, lumbosacral region     Problems updated and reviewed during this visit: No problems updated. Plan of Care  Pharmacotherapy (Medications Ordered): No orders of the defined types were placed in this encounter.  Medications administered today: Shepard T. Charise Killian. "Tom" had no medications administered during this visit.   Procedure Orders     LUMBAR FACET(MEDIAL BRANCH NERVE BLOCK) [facility] Lab Orders  No laboratory test(s) ordered today    Imaging Orders  No imaging studies ordered today   Referral Orders  No referral(s) requested today   Interventional management options: Planned, scheduled, and/or pending:   PRN - Therapeutic left-sided lumbar facet RFA #1 under fluoroscopic guidance and IV sedation.  Once completed, we will do the right side 2 weeks later.   Considering:   Diagnostic bilateral lumbar facet block #3 under fluoroscopic guidance and IV sedation    Palliative PRN treatment(s):   Palliative bilateral lumbar facet block under fluoroscopic guidance and IV sedation    Provider-requested follow-up: Return for PRN Procedure.  Future Appointments  Date Time Provider [place]  04/18/2018  9:00 AM Jerrol Banana., MD [facility] None   Primary Care Physician: Jerrol Banana., MD Location: [facility] Note by: Gaspar Cola, MD Date: 12/13/2017; Time: 8:28 AM

## 2017-12-13 ENCOUNTER — Ambulatory Visit: Payer: BC Managed Care – PPO | Attending: Pain Medicine | Admitting: Pain Medicine

## 2017-12-13 ENCOUNTER — Other Ambulatory Visit: Payer: Self-pay

## 2017-12-13 ENCOUNTER — Encounter: Payer: Self-pay | Admitting: Pain Medicine

## 2017-12-13 VITALS — BP 113/92 | HR 70 | Temp 98.3°F | Resp 16 | Ht 71.0 in | Wt 330.0 lb

## 2017-12-13 DIAGNOSIS — M47897 Other spondylosis, lumbosacral region: Secondary | ICD-10-CM | POA: Insufficient documentation

## 2017-12-13 DIAGNOSIS — M5442 Lumbago with sciatica, left side: Secondary | ICD-10-CM | POA: Insufficient documentation

## 2017-12-13 DIAGNOSIS — M4306 Spondylolysis, lumbar region: Secondary | ICD-10-CM | POA: Insufficient documentation

## 2017-12-13 DIAGNOSIS — Z87891 Personal history of nicotine dependence: Secondary | ICD-10-CM | POA: Diagnosis not present

## 2017-12-13 DIAGNOSIS — M5030 Other cervical disc degeneration, unspecified cervical region: Secondary | ICD-10-CM | POA: Diagnosis not present

## 2017-12-13 DIAGNOSIS — E559 Vitamin D deficiency, unspecified: Secondary | ICD-10-CM | POA: Insufficient documentation

## 2017-12-13 DIAGNOSIS — E782 Mixed hyperlipidemia: Secondary | ICD-10-CM | POA: Diagnosis not present

## 2017-12-13 DIAGNOSIS — Z79899 Other long term (current) drug therapy: Secondary | ICD-10-CM | POA: Insufficient documentation

## 2017-12-13 DIAGNOSIS — G4730 Sleep apnea, unspecified: Secondary | ICD-10-CM | POA: Diagnosis not present

## 2017-12-13 DIAGNOSIS — M47817 Spondylosis without myelopathy or radiculopathy, lumbosacral region: Secondary | ICD-10-CM | POA: Diagnosis not present

## 2017-12-13 DIAGNOSIS — I10 Essential (primary) hypertension: Secondary | ICD-10-CM | POA: Diagnosis not present

## 2017-12-13 DIAGNOSIS — Z6841 Body Mass Index (BMI) 40.0 and over, adult: Secondary | ICD-10-CM | POA: Diagnosis not present

## 2017-12-13 DIAGNOSIS — F419 Anxiety disorder, unspecified: Secondary | ICD-10-CM | POA: Diagnosis not present

## 2017-12-13 DIAGNOSIS — G5603 Carpal tunnel syndrome, bilateral upper limbs: Secondary | ICD-10-CM | POA: Insufficient documentation

## 2017-12-13 DIAGNOSIS — Z7982 Long term (current) use of aspirin: Secondary | ICD-10-CM | POA: Diagnosis not present

## 2017-12-13 DIAGNOSIS — K219 Gastro-esophageal reflux disease without esophagitis: Secondary | ICD-10-CM | POA: Diagnosis not present

## 2017-12-13 DIAGNOSIS — M170 Bilateral primary osteoarthritis of knee: Secondary | ICD-10-CM | POA: Diagnosis not present

## 2017-12-13 DIAGNOSIS — Z86010 Personal history of colonic polyps: Secondary | ICD-10-CM | POA: Diagnosis not present

## 2017-12-13 DIAGNOSIS — M5136 Other intervertebral disc degeneration, lumbar region: Secondary | ICD-10-CM | POA: Diagnosis not present

## 2017-12-13 DIAGNOSIS — R739 Hyperglycemia, unspecified: Secondary | ICD-10-CM | POA: Insufficient documentation

## 2017-12-13 DIAGNOSIS — G8929 Other chronic pain: Secondary | ICD-10-CM | POA: Diagnosis not present

## 2017-12-13 DIAGNOSIS — M4302 Spondylolysis, cervical region: Secondary | ICD-10-CM | POA: Insufficient documentation

## 2017-12-13 DIAGNOSIS — E669 Obesity, unspecified: Secondary | ICD-10-CM | POA: Diagnosis not present

## 2017-12-13 DIAGNOSIS — M47896 Other spondylosis, lumbar region: Secondary | ICD-10-CM | POA: Diagnosis not present

## 2017-12-13 DIAGNOSIS — M47816 Spondylosis without myelopathy or radiculopathy, lumbar region: Secondary | ICD-10-CM | POA: Insufficient documentation

## 2017-12-13 NOTE — Progress Notes (Signed)
Safety precautions to be maintained throughout the outpatient stay will include: orient to surroundings, keep bed in low position, maintain call bell within reach at all times, provide assistance with transfer out of bed and ambulation.  

## 2017-12-13 NOTE — Patient Instructions (Addendum)
____________________________________________________________________________________________  Muscle Spasms & Cramps  Cause:  The most common cause of muscle spasms and cramps is vitamin and/or electrolyte (calcium, potassium, sodium, etc.) deficiencies.  Possible triggers: Sweating - causes loss of electrolytes thru the skin. Steroids - causes loss of electrolytes thru the urine.  Treatment: 1. Gatorade (or any other electrolyte-replenishing drink) - Take 1, 8 oz glass with each meal (3 times a day). 2. OTC (over-the-counter) Magnesium 400 to 500 mg - Take 1 tablet twice a day (one with breakfast and one before bedtime). If you have kidney problems, talk to your primary care physician before taking any Magnesium. 3. Tonic Water with quinine - Take 1, 8 oz glass before bedtime.   ____________________________________________________________________________________________  ____________________________________________________________________________________________  Pain Prevention Technique  Definition:   A technique used to minimize the effects of an activity known to cause inflammation or swelling, which in turn leads to an increase in pain.  Purpose: To prevent swelling from occurring. It is based on the fact that it is easier to prevent swelling from happening than it is to get rid of it, once it occurs.  Contraindications: 1. Anyone with allergy or hypersensitivity to the recommended medications. 2. Anyone taking anticoagulants (Blood Thinners) (e.g., Coumadin, Warfarin, Plavix, etc.). 3. Patients in Renal Failure.  Technique: Before you undertake an activity known to cause pain, or a flare-up of your chronic pain, and before you experience any pain, do the following:  1. On a full stomach, take 4 (four) over the counter Ibuprofens 200mg  tablets (Motrin), for a total of 800 mg. 2. In addition, take over the counter Magnesium 400 to 500 mg, before doing the activity.  3. Six  (6) hours later, again on a full stomach, repeat the Ibuprofen. 4. That night, take a warm shower and stretch under the running warm water.  This technique may be sufficient to abort the pain and discomfort before it happens. Keep in mind that it takes a lot less medication to prevent swelling than it takes to eliminate it once it occurs.  ____________________________________________________________________________________________     ____________________________________________________________________________________________  Preparing for Procedure with Sedation  Instructions: . Oral Intake: Do not eat or drink anything for at least 8 hours prior to your procedure. . Transportation: Public transportation is not allowed. Bring an adult driver. The driver must be physically present in our waiting room before any procedure can be started. Marland Kitchen Physical Assistance: Bring an adult physically capable of assisting you, in the event you need help. This adult should keep you company at home for at least 6 hours after the procedure. . Blood Pressure Medicine: Take your blood pressure medicine with a sip of water the morning of the procedure. . Blood thinners: Notify our staff if you are taking any blood thinners. Depending on which one you take, there will be specific instructions on how and when to stop it. . Diabetics on insulin: Notify the staff so that you can be scheduled 1st case in the morning. If your diabetes requires high dose insulin, take only  of your normal insulin dose the morning of the procedure and notify the staff that you have done so. . Preventing infections: Shower with an antibacterial soap the morning of your procedure. . Build-up your immune system: Take 1000 mg of Vitamin C with every meal (3 times a day) the day prior to your procedure. Marland Kitchen Antibiotics: Inform the staff if you have a condition or reason that requires you to take antibiotics before dental procedures. . Pregnancy:  If you are pregnant, call and cancel the procedure. . Sickness: If you have a cold, fever, or any active infections, call and cancel the procedure. . Arrival: You must be in the facility at least 30 minutes prior to your scheduled procedure. . Children: Do not bring children with you. . Dress appropriately: Bring dark clothing that you would not mind if they get stained. . Valuables: Do not bring any jewelry or valuables.  Procedure appointments are reserved for interventional treatments only. Marland Kitchen No Prescription Refills. . No medication changes will be discussed during procedure appointments. . No disability issues will be discussed.  Reasons to call and reschedule or cancel your procedure: (Following these recommendations will minimize the risk of a serious complication.) . Surgeries: Avoid having procedures within 2 weeks of any surgery. (Avoid for 2 weeks before or after any surgery). . Flu Shots: Avoid having procedures within 2 weeks of a flu shots or . (Avoid for 2 weeks before or after immunizations). . Barium: Avoid having a procedure within 7-10 days after having had a radiological study involving the use of radiological contrast. (Myelograms, Barium swallow or enema study). . Heart attacks: Avoid any elective procedures or surgeries for the initial 6 months after a "Myocardial Infarction" (Heart Attack). . Blood thinners: It is imperative that you stop these medications before procedures. Let us know if you if you take any blood thinner.  . Infection: Avoid procedures during or within two weeks of an infection (including chest colds or gastrointestinal problems). Symptoms associated with infections include: Localized redness, fever, chills, night sweats or profuse sweating, burning sensation when voiding, cough, congestion, stuffiness, runny nose, sore throat, diarrhea, nausea, vomiting, cold or Flu symptoms, recent or current infections. It is specially important if the infection is over  the area that we intend to treat. Marland Kitchen Heart and lung problems: Symptoms that may suggest an active cardiopulmonary problem include: cough, chest pain, breathing difficulties or shortness of breath, dizziness, ankle swelling, uncontrolled high or unusually low blood pressure, and/or palpitations. If you are experiencing any of these symptoms, cancel your procedure and contact your primary care physician for an evaluation.  Remember:  Regular Business hours are:  Monday to Thursday 8:00 AM to 4:00 PM  Provider's Schedule: Milinda Pointer, MD:  Procedure days: Tuesday and Thursday 7:30 AM to 4:00 PM  Gillis Santa, MD:  Procedure days: Monday and Wednesday 7:30 AM to 4:00 PM ____________________________________________________________________________________________   ____________________________________________________________________________________________  Preparing for your procedure (without sedation)  Instructions: . Oral Intake: Do not eat or drink anything for at least 3 hours prior to your procedure. . Transportation: Unless otherwise stated by your physician, you may drive yourself after the procedure. . Blood Pressure Medicine: Take your blood pressure medicine with a sip of water the morning of the procedure. . Blood thinners: Notify our staff if you are taking any blood thinners. Depending on which one you take, there will be specific instructions on how and when to stop it. . Diabetics on insulin: Notify the staff so that you can be scheduled 1st case in the morning. If your diabetes requires high dose insulin, take only  of your normal insulin dose the morning of the procedure and notify the staff that you have done so. . Preventing infections: Shower with an antibacterial soap the morning of your procedure.  . Build-up your immune system: Take 1000 mg of Vitamin C with every meal (3 times a day) the day prior to your procedure. Marland Kitchen Antibiotics: Inform the  staff if you have a  condition or reason that requires you to take antibiotics before dental procedures. . Pregnancy: If you are pregnant, call and cancel the procedure. . Sickness: If you have a cold, fever, or any active infections, call and cancel the procedure. . Arrival: You must be in the facility at least 30 minutes prior to your scheduled procedure. . Children: Do not bring any children with you. . Dress appropriately: Bring dark clothing that you would not mind if they get stained. . Valuables: Do not bring any jewelry or valuables.  Procedure appointments are reserved for interventional treatments only. Marland Kitchen No Prescription Refills. . No medication changes will be discussed during procedure appointments. . No disability issues will be discussed.  Reasons to call and reschedule or cancel your procedure: (Following these recommendations will minimize the risk of a serious complication.) . Surgeries: Avoid having procedures within 2 weeks of any surgery. (Avoid for 2 weeks before or after any surgery). . Flu Shots: Avoid having procedures within 2 weeks of a flu shots or . (Avoid for 2 weeks before or after immunizations). . Barium: Avoid having a procedure within 7-10 days after having had a radiological study involving the use of radiological contrast. (Myelograms, Barium swallow or enema study). . Heart attacks: Avoid any elective procedures or surgeries for the initial 6 months after a "Myocardial Infarction" (Heart Attack). . Blood thinners: It is imperative that you stop these medications before procedures. Let us know if you if you take any blood thinner.  . Infection: Avoid procedures during or within two weeks of an infection (including chest colds or gastrointestinal problems). Symptoms associated with infections include: Localized redness, fever, chills, night sweats or profuse sweating, burning sensation when voiding, cough, congestion, stuffiness, runny nose, sore throat, diarrhea, nausea, vomiting,  cold or Flu symptoms, recent or current infections. It is specially important if the infection is over the area that we intend to treat. Marland Kitchen Heart and lung problems: Symptoms that may suggest an active cardiopulmonary problem include: cough, chest pain, breathing difficulties or shortness of breath, dizziness, ankle swelling, uncontrolled high or unusually low blood pressure, and/or palpitations. If you are experiencing any of these symptoms, cancel your procedure and contact your primary care physician for an evaluation.  Remember:  Regular Business hours are:  Monday to Thursday 8:00 AM to 4:00 PM  Provider's Schedule: Milinda Pointer, MD:  Procedure days: Tuesday and Thursday 7:30 AM to 4:00 PM  Gillis Santa, MD:  Procedure days: Monday and Wednesday 7:30 AM to 4:00 PM ____________________________________________________________________________________________

## 2017-12-31 ENCOUNTER — Other Ambulatory Visit: Payer: Self-pay | Admitting: Family Medicine

## 2017-12-31 ENCOUNTER — Ambulatory Visit: Payer: BC Managed Care – PPO | Admitting: Urology

## 2017-12-31 DIAGNOSIS — R350 Frequency of micturition: Principal | ICD-10-CM

## 2017-12-31 DIAGNOSIS — N401 Enlarged prostate with lower urinary tract symptoms: Secondary | ICD-10-CM

## 2018-01-26 ENCOUNTER — Other Ambulatory Visit: Payer: Self-pay | Admitting: Family Medicine

## 2018-03-24 ENCOUNTER — Ambulatory Visit: Payer: BC Managed Care – PPO | Admitting: Family Medicine

## 2018-03-24 VITALS — BP 134/72 | HR 56 | Temp 98.3°F | Resp 16 | Wt 334.0 lb

## 2018-03-24 DIAGNOSIS — J101 Influenza due to other identified influenza virus with other respiratory manifestations: Secondary | ICD-10-CM

## 2018-03-24 MED ORDER — OSELTAMIVIR PHOSPHATE 75 MG PO CAPS: 75.0000 mg | ORAL_CAPSULE | Freq: Two times a day (BID) | ORAL | Status: DC

## 2018-03-24 MED ORDER — OSELTAMIVIR PHOSPHATE 75 MG PO CAPS: 75.0000 mg | ORAL_CAPSULE | Freq: Two times a day (BID) | ORAL | 0 refills | Status: DC

## 2018-03-24 NOTE — Progress Notes (Signed)
Terry Horn.  MRN: [id number] DOB: 13-Jan-1961  Subjective:  HPI   The patient is a 58 year old male who presents with symptoms of fever, head congestion with drainage and body aches.  He states this began yesterday when he thought he was cold because of being in a meeting where the room was chilly.  He stayed cold the rest of the day.  He states his wife checked his temperature after getting home and he said it was 101-102.  He went to bed and has had symptoms progressively getting worse.  Patient used Tylenol but no other medications yet for the symptoms.  He states that if this is the flu he would like to have his wife and son get prophylactic treated.  Patient Active Problem List   Diagnosis Date Noted  . Chronic knee pain (Bilateral) 09/09/2017  . Osteoarthritis of knee (Secondary) (Bilateral) 09/09/2017  . Congenital Lumbar central spinal stenosis 09/09/2017  . Cervical central spinal stenosis 09/09/2017  . Carpal tunnel syndrome (Bilateral) 09/09/2017  . Abnormal nerve conduction studies 09/09/2017  . Osteoarthritis of spine with radiculopathy, lumbosacral region 09/09/2017  . Osteoarthritis of facet joint of lumbar spine 09/09/2017  . Osteoarthritis involving multiple joints on both sides of body 09/09/2017  . Cervicalgia 09/09/2017  . Chronic neck pain 09/09/2017  . Numbness of upper extremity (Bilateral) 09/09/2017  . Numbness of lower extremity (Left) 09/09/2017  . Spondylosis without myelopathy or radiculopathy, lumbosacral region 09/09/2017  . Lumbar facet syndrome (Midline pain pattern) (Bilateral) 09/09/2017  . Lumbar facet arthropathy (L3-4, L4-L5, and L5-S1) (Bilateral) 09/09/2017  . Chronic midline low back pain without sciatica 09/09/2017  . Hyperglycemia 02/20/2016  . Chronic low back pain (Primary Area of Pain) (Bilateral) w/ sciatica (Left) 02/20/2016  . DDD (degenerative disc disease), lumbar 02/20/2016  . Lumbar spondylosis 02/20/2016  . DDD  (degenerative disc disease), cervical 02/20/2016  . Cervical spondylolysis 02/20/2016  . History of colonic polyps 02/06/2016  . Encounter for screening colonoscopy 01/21/2016  . Mixed hyperlipidemia 10/01/2015  . Chest pressure 07/26/2015  . Paresthesia 07/26/2015  . Arthritis 01/02/2015  . Body mass index (BMI) of 50-59.9 in adult ([place]) 01/02/2015  . Essential (primary) hypertension 01/02/2015  . Adiposity 01/02/2015  . Apnea, sleep 01/02/2015  . Avitaminosis D 01/02/2015    Past Medical History:  Diagnosis Date  . Anxiety    just recently  . Arthritis   . Cancer ([place])    skin cancer removed  . GERD (gastroesophageal reflux disease)   . Hypertension   . Pneumonia   . Sleep apnea    with cpap    Social History   Socioeconomic History  . Marital status: Married    Spouse name: Not on file  . Number of children: Not on file  . Years of education: Not on file  . Highest education level: Not on file  Occupational History  . Not on file  Social Needs  . Financial resource strain: Not on file  . Food insecurity:    Worry: Not on file    Inability: Not on file  . Transportation needs:    Medical: Not on file    Non-medical: Not on file  Tobacco Use  . Smoking status: Former Smoker    Years: 22.00    Last attempt to quit: 09/05/1996    Years since quitting: 21.5  . Smokeless tobacco: Never Used  . Tobacco comment: Quit in 1998  Substance and Sexual Activity  . Alcohol  use: Yes    Comment: occasionally  . Drug use: No  . Sexual activity: Not on file  Lifestyle  . Physical activity:    Days per week: Not on file    Minutes per session: Not on file  . Stress: Not on file  Relationships  . Social connections:    Talks on phone: Not on file    Gets together: Not on file    Attends religious service: Not on file    Active member of club or organization: Not on file    Attends meetings of clubs or organizations: Not on file    Relationship status: Not on file    . Intimate partner violence:    Fear of current or ex partner: Not on file    Emotionally abused: Not on file    Physically abused: Not on file    Forced sexual activity: Not on file  Other Topics Concern  . Not on file  Social History Narrative  . Not on file    Outpatient Encounter Medications as of 03/24/2018  Medication Sig  . aspirin 81 MG tablet Take 81 mg by mouth daily.   . Azelastine-Fluticasone (DYMISTA) 137-50 MCG/ACT SUSP Place 2 sprays into the nose daily.  . cetirizine-pseudoephedrine (ZYRTEC-D ALLERGY & CONGESTION) 5-120 MG tablet Take 1 tablet by mouth 2 (two) times daily.   . Cholecalciferol 1000 UNITS tablet Take 1,000 Units by mouth daily.   . finasteride (PROSCAR) 5 MG tablet TAKE ONE TABLET BY MOUTH DAILY  . gabapentin (NEURONTIN) 100 MG capsule Take 100 mg by mouth 3 (three) times daily.  Marland Kitchen losartan-hydrochlorothiazide (HYZAAR) 100-12.5 MG tablet TAKE ONE TABLET BY MOUTH DAILY  . Misc Natural Products (GLUCOSAMINE CHONDROITIN COMPLX) TABS Take 1 tablet by mouth daily.   . Multiple Vitamin tablet Take 1 tablet by mouth daily.   . OMEGA-3 FATTY ACIDS PO Take 1 tablet by mouth daily.   . tamsulosin (FLOMAX) 0.4 MG CAPS capsule TAKE ONE CAPSULE BY MOUTH DAILY  . Turmeric, Curcuma Longa, POWD Take by mouth.   . vitamin C (ASCORBIC ACID) 500 MG tablet Take 500 mg by mouth daily.   . vitamin E 400 UNIT capsule Take 400 Units by mouth daily.   . pravastatin (PRAVACHOL) 20 MG tablet Take 20 mg by mouth daily.  . [DISCONTINUED] finasteride (PROSCAR) 5 MG tablet TAKE ONE TABLET BY MOUTH DAILY   No facility-administered encounter medications on file as of 03/24/2018.     No Known Allergies  Review of Systems  Constitutional: Positive for fever and malaise/fatigue.  HENT: Positive for congestion and sore throat (not really sore but feels like he has a tickle in his throat). Negative for ear discharge, ear pain, hearing loss, nosebleeds, sinus pain and tinnitus.   Eyes:  Negative for blurred vision, double vision, photophobia, pain, discharge and redness.  Respiratory: Positive for cough (dry). Negative for sputum production, shortness of breath and wheezing.   Cardiovascular: Negative for chest pain, palpitations, orthopnea, claudication and leg swelling.  Musculoskeletal: Positive for myalgias.  Neurological: Positive for headaches. Negative for dizziness.  Endo/Heme/Allergies: Negative.   Psychiatric/Behavioral: Negative.     Objective:  BP 134/72 (BP Location: Right Arm, Patient Position: Sitting, Cuff Size: Large)   Pulse (!) 56   Temp 98.3 F (36.8 C) (Oral)   Resp 16   Wt (!) 334 lb (151.5 kg)   SpO2 97%   BMI 46.58 kg/m   Physical Exam  Constitutional: He is oriented to person,  place, and time and well-developed, well-nourished, and in no distress.  Obese WM NAD but obviously does not feel well.  HENT:  Head: Normocephalic and atraumatic.  Eyes: Conjunctivae are normal. No scleral icterus.  Neck: No thyromegaly present.  Cardiovascular: Normal rate, regular rhythm and normal heart sounds.  Pulmonary/Chest: Effort normal and breath sounds normal.  Abdominal: Soft.  Musculoskeletal:        General: No edema.  Neurological: He is alert and oriented to person, place, and time. Gait normal. GCS score is 15.  Skin: Skin is warm and dry.  Psychiatric: Mood, memory, affect and judgment normal.    Assessment and Plan :  1. Influenza B Cover with Tamiflu--rest ,fluids,tylenol. Call back for worsening symptoms.  I have done the exam and reviewed the chart and it is accurate to the best of my knowledge. Development worker, community has been used and  any errors in dictation or transcription are unintentional. Miguel Aschoff M.D. [facility]

## 2018-04-18 ENCOUNTER — Encounter: Payer: Self-pay | Admitting: Family Medicine

## 2018-05-10 ENCOUNTER — Ambulatory Visit (INDEPENDENT_AMBULATORY_CARE_PROVIDER_SITE_OTHER): Payer: BC Managed Care – PPO | Admitting: Family Medicine

## 2018-05-10 VITALS — BP 120/62 | HR 60 | Temp 98.0°F | Resp 16 | Ht 71.0 in | Wt 336.0 lb

## 2018-05-10 DIAGNOSIS — Z0000 Encounter for general adult medical examination without abnormal findings: Secondary | ICD-10-CM

## 2018-05-10 DIAGNOSIS — M5442 Lumbago with sciatica, left side: Secondary | ICD-10-CM

## 2018-05-10 DIAGNOSIS — Z6841 Body Mass Index (BMI) 40.0 and over, adult: Secondary | ICD-10-CM

## 2018-05-10 DIAGNOSIS — Z125 Encounter for screening for malignant neoplasm of prostate: Secondary | ICD-10-CM | POA: Diagnosis not present

## 2018-05-10 DIAGNOSIS — G8929 Other chronic pain: Secondary | ICD-10-CM

## 2018-05-10 DIAGNOSIS — G4733 Obstructive sleep apnea (adult) (pediatric): Secondary | ICD-10-CM

## 2018-05-10 MED ORDER — GABAPENTIN 300 MG PO CAPS: 300.0000 mg | ORAL_CAPSULE | Freq: Two times a day (BID) | ORAL | 3 refills | Status: DC

## 2018-05-10 NOTE — Progress Notes (Signed)
Patient: Terry Biel., Male    DOB: 27-Apr-1960, 58 y.o.   MRN: [id number] Visit Date: 05/10/2018  Today's Provider: Wilhemena Durie, MD   Chief Complaint  Patient presents with  . Annual Exam   Subjective:  Terry Rondinelli. is a 58 y.o. male who presents today for health maintenance and complete physical. He feels well. He reports exercising none. He reports he is sleeping well.   Review of Systems  Constitutional: Negative.   HENT: Positive for congestion and sneezing.   Eyes: Negative.   Respiratory: Positive for apnea.   Cardiovascular: Negative.   Gastrointestinal: Negative.   Endocrine: Negative.   Genitourinary: Negative.   Musculoskeletal: Positive for arthralgias, back pain, neck pain and neck stiffness.  Skin: Negative.   Allergic/Immunologic: Negative.   Neurological: Positive for numbness.  Hematological: Negative.   Psychiatric/Behavioral: Negative.   Occasional momentary lightheadedness when initially standing.  Chronic low back pain with mild radiculopathy.  It was 4 out of 5 over 10 now it is a 0-1 over 10  Social History   Socioeconomic History  . Marital status: Married    Spouse name: Not on file  . Number of children: Not on file  . Years of education: Not on file  . Highest education level: Not on file  Occupational History  . Not on file  Social Needs  . Financial resource strain: Not on file  . Food insecurity:    Worry: Not on file    Inability: Not on file  . Transportation needs:    Medical: Not on file    Non-medical: Not on file  Tobacco Use  . Smoking status: Former Smoker    Years: 22.00    Last attempt to quit: 09/05/1996    Years since quitting: 21.6  . Smokeless tobacco: Never Used  . Tobacco comment: Quit in 1998  Substance and Sexual Activity  . Alcohol use: Yes    Comment: occasionally  . Drug use: No  . Sexual activity: Not on file  Lifestyle  . Physical activity:    Days per week: Not on file    Minutes per  session: Not on file  . Stress: Not on file  Relationships  . Social connections:    Talks on phone: Not on file    Gets together: Not on file    Attends religious service: Not on file    Active member of club or organization: Not on file    Attends meetings of clubs or organizations: Not on file    Relationship status: Not on file  . Intimate partner violence:    Fear of current or ex partner: Not on file    Emotionally abused: Not on file    Physically abused: Not on file    Forced sexual activity: Not on file  Other Topics Concern  . Not on file  Social History Narrative  . Not on file    Patient Active Problem List   Diagnosis Date Noted  . Chronic knee pain (Bilateral) 09/09/2017  . Osteoarthritis of knee (Secondary) (Bilateral) 09/09/2017  . Congenital Lumbar central spinal stenosis 09/09/2017  . Cervical central spinal stenosis 09/09/2017  . Carpal tunnel syndrome (Bilateral) 09/09/2017  . Abnormal nerve conduction studies 09/09/2017  . Osteoarthritis of spine with radiculopathy, lumbosacral region 09/09/2017  . Osteoarthritis of facet joint of lumbar spine 09/09/2017  . Osteoarthritis involving multiple joints on both sides of body 09/09/2017  . Cervicalgia 09/09/2017  . Chronic neck  pain 09/09/2017  . Numbness of upper extremity (Bilateral) 09/09/2017  . Numbness of lower extremity (Left) 09/09/2017  . Spondylosis without myelopathy or radiculopathy, lumbosacral region 09/09/2017  . Lumbar facet syndrome (Midline pain pattern) (Bilateral) 09/09/2017  . Lumbar facet arthropathy (L3-4, L4-L5, and L5-S1) (Bilateral) 09/09/2017  . Chronic midline low back pain without sciatica 09/09/2017  . Hyperglycemia 02/20/2016  . Chronic low back pain (Primary Area of Pain) (Bilateral) w/ sciatica (Left) 02/20/2016  . DDD (degenerative disc disease), lumbar 02/20/2016  . Lumbar spondylosis 02/20/2016  . DDD (degenerative disc disease), cervical 02/20/2016  . Cervical  spondylolysis 02/20/2016  . History of colonic polyps 02/06/2016  . Encounter for screening colonoscopy 01/21/2016  . Mixed hyperlipidemia 10/01/2015  . Chest pressure 07/26/2015  . Paresthesia 07/26/2015  . Arthritis 01/02/2015  . Body mass index (BMI) of 50-59.9 in adult ([place]) 01/02/2015  . Essential (primary) hypertension 01/02/2015  . Adiposity 01/02/2015  . Apnea, sleep 01/02/2015  . Avitaminosis D 01/02/2015    Past Surgical History:  Procedure Laterality Date  . EXCISION OF SKIN TAG N/A 02/06/2016   Procedure: EXCISION OF SKIN TAG;  Surgeon: [name], MD;  Location: [facility] ORS;  Service: General;  Laterality: N/A;  . TONSILLECTOMY      His family history includes Bipolar disorder in his mother; Depression in his sister; Prostate cancer in his paternal grandfather and paternal uncle; Thyroid disease in his mother.     Outpatient Encounter Medications as of 05/10/2018  Medication Sig  . aspirin 81 MG tablet Take 81 mg by mouth daily.   . Azelastine-Fluticasone (DYMISTA) 137-50 MCG/ACT SUSP Place 2 sprays into the nose daily.  . cetirizine-pseudoephedrine (ZYRTEC-D ALLERGY & CONGESTION) 5-120 MG tablet Take 1 tablet by mouth 2 (two) times daily.   . Cholecalciferol 1000 UNITS tablet Take 1,000 Units by mouth daily.   . finasteride (PROSCAR) 5 MG tablet TAKE ONE TABLET BY MOUTH DAILY  . gabapentin (NEURONTIN) 100 MG capsule Take 100 mg by mouth 3 (three) times daily.  Marland Kitchen losartan-hydrochlorothiazide (HYZAAR) 100-12.5 MG tablet TAKE ONE TABLET BY MOUTH DAILY  . Misc Natural Products (GLUCOSAMINE CHONDROITIN COMPLX) TABS Take 1 tablet by mouth daily.   . Multiple Vitamin tablet Take 1 tablet by mouth daily.   . OMEGA-3 FATTY ACIDS PO Take 1 tablet by mouth daily.   Marland Kitchen oseltamivir (TAMIFLU) 75 MG capsule Take 1 capsule (75 mg total) by mouth 2 (two) times daily.  . tamsulosin (FLOMAX) 0.4 MG CAPS capsule TAKE ONE CAPSULE BY MOUTH DAILY  . Turmeric, Curcuma Longa, POWD Take by  mouth.   . vitamin C (ASCORBIC ACID) 500 MG tablet Take 500 mg by mouth daily.   . vitamin E 400 UNIT capsule Take 400 Units by mouth daily.   . pravastatin (PRAVACHOL) 20 MG tablet Take 20 mg by mouth daily.   No facility-administered encounter medications on file as of 05/10/2018.     Patient Care Team: Jerrol Banana., MD as PCP - General (Family Medicine) Jerrol Banana., MD (Family Medicine) Bary Castilla Forest Gleason, MD (General Surgery)      Objective:   Vitals:  Vitals:   05/10/18 0931  BP: 120/62  Pulse: 60  Resp: 16  Temp: 98 F (36.7 C)  TempSrc: Oral  SpO2: 97%  Weight: (!) 336 lb (152.4 kg)  Height: 5\' 11"  (1.803 m)    Physical Exam Constitutional:      Appearance: Normal appearance. He is obese.  HENT:  Head: Normocephalic and atraumatic.     Right Ear: Tympanic membrane, ear canal and external ear normal.     Left Ear: Tympanic membrane, ear canal and external ear normal.     Nose: Nose normal.     Mouth/Throat:     Mouth: Mucous membranes are moist.     Pharynx: Oropharynx is clear.  Eyes:     Extraocular Movements: Extraocular movements intact.     Conjunctiva/sclera: Conjunctivae normal.     Pupils: Pupils are equal, round, and reactive to light.  Neck:     Musculoskeletal: Normal range of motion and neck supple.  Cardiovascular:     Rate and Rhythm: Normal rate and regular rhythm.     Pulses: Normal pulses.     Heart sounds: Normal heart sounds.  Pulmonary:     Effort: Pulmonary effort is normal.     Breath sounds: Normal breath sounds.  Abdominal:     General: Abdomen is flat.     Palpations: Abdomen is soft.  Genitourinary:    Penis: Normal.      Scrotum/Testes: Normal.     Prostate: Normal.     Rectum: Normal.  Musculoskeletal: Normal range of motion.  Skin:    General: Skin is warm and dry.  Neurological:     General: No focal deficit present.     Mental Status: He is alert and oriented to person, place, and time.  Mental status is at baseline.  Psychiatric:        Mood and Affect: Mood normal.        Behavior: Behavior normal.        Thought Content: Thought content normal.        Judgment: Judgment normal.    Fall Risk  05/10/2018 12/13/2017 11/23/2017 09/27/2017 09/09/2017  Falls in the past year? 0 No No No No   Depression Screen PHQ 2/9 Scores 05/10/2018 12/13/2017 11/23/2017 09/27/2017  PHQ - 2 Score 0 0 0 0     Office Visit from 05/10/2018 in [place]  AUDIT-C Score  1     Functional Status Survey: Is the patient deaf or have difficulty hearing?: No Does the patient have difficulty seeing, even when wearing glasses/contacts?: Yes Does the patient have difficulty concentrating, remembering, or making decisions?: No Does the patient have difficulty walking or climbing stairs?: Yes Does the patient have difficulty dressing or bathing?: No Does the patient have difficulty doing errands alone such as visiting a doctor's office or shopping?: No   Assessment & Plan:     Routine Health Maintenance and Physical Exam  Exercise Activities and Dietary recommendations Goals   None     Immunization History  Administered Date(s) Administered  . Pneumococcal Polysaccharide-23 12/21/1991  . Tdap 10/26/2007    Health Maintenance  Topic Date Due  . Hepatitis C Screening  1960/07/21  . HIV Screening  09/14/1975  . INFLUENZA VACCINE  09/30/2017  . TETANUS/TDAP  10/25/2017  . COLONOSCOPY  02/05/2026     Discussed health benefits of physical activity, and encouraged him to engage in regular exercise appropriate for his age and condition.    1. Annual physical exam  - CBC with Differential/Platelet - Comprehensive metabolic panel - Lipid Panel With LDL/HDL Ratio - TSH  2. Prostate cancer screening  - PSA  3. Chronic low back pain (Primary Area of Pain) (Bilateral) w/ sciatica (Left) Will increase Gabapentin today.  Treated by Dr. Drema [facility] and [facility] in [place] and  Dr. Dossie Arbour here in  [place] - gabapentin (NEURONTIN) 300 MG capsule; Take 1 capsule (300 mg total) by mouth 2 (two) times daily.  Dispense: 180 capsule; Refill: 3  Increase Gabapentin. 4.Morbid Obesity 5.OSA on CPAP  HPI, Exam and A&P Transcribed under the direction and in the presence of Miguel Aschoff, Brooke Bonito., MD. Electronically Signed: Althea Charon, RMA  I have done the exam and reviewed the chart and it is accurate to the best of my knowledge. Development worker, community has been used and  any errors in dictation or transcription are unintentional. Miguel Aschoff M.D. [facility]

## 2018-05-11 ENCOUNTER — Telehealth: Payer: Self-pay

## 2018-05-11 LAB — CBC WITH DIFFERENTIAL/PLATELET
Basophils Absolute: 0 10*3/uL (ref 0.0–0.2)
Basos: 1 %
EOS (ABSOLUTE): 0.1 10*3/uL (ref 0.0–0.4)
Eos: 2 %
Hematocrit: 41.3 % (ref 37.5–51.0)
Hemoglobin: 13.3 g/dL (ref 13.0–17.7)
Immature Grans (Abs): 0 10*3/uL (ref 0.0–0.1)
Immature Granulocytes: 0 %
LYMPHS ABS: 2 10*3/uL (ref 0.7–3.1)
Lymphs: 40 %
MCH: 27.3 pg (ref 26.6–33.0)
MCHC: 32.2 g/dL (ref 31.5–35.7)
MCV: 85 fL (ref 79–97)
MONOS ABS: 0.4 10*3/uL (ref 0.1–0.9)
Monocytes: 7 %
Neutrophils Absolute: 2.5 10*3/uL (ref 1.4–7.0)
Neutrophils: 50 %
Platelets: 244 10*3/uL (ref 150–450)
RBC: 4.87 x10E6/uL (ref 4.14–5.80)
RDW: 12.9 % (ref 11.6–15.4)
WBC: 5 10*3/uL (ref 3.4–10.8)

## 2018-05-11 LAB — COMPREHENSIVE METABOLIC PANEL
ALT: 19 IU/L (ref 0–44)
AST: 19 IU/L (ref 0–40)
Albumin/Globulin Ratio: 2.4 — ABNORMAL HIGH (ref 1.2–2.2)
Albumin: 4.5 g/dL (ref 3.8–4.9)
Alkaline Phosphatase: 67 IU/L (ref 39–117)
BUN/Creatinine Ratio: 11 (ref 9–20)
BUN: 8 mg/dL (ref 6–24)
Bilirubin Total: 0.4 mg/dL (ref 0.0–1.2)
CO2: 24 mmol/L (ref 20–29)
Calcium: 9.3 mg/dL (ref 8.7–10.2)
Chloride: 102 mmol/L (ref 96–106)
Creatinine, Ser: 0.72 mg/dL — ABNORMAL LOW (ref 0.76–1.27)
GFR, EST AFRICAN AMERICAN: 120 mL/min/{1.73_m2} (ref >59)
GFR, EST NON AFRICAN AMERICAN: 104 mL/min/{1.73_m2} (ref >59)
Globulin, Total: 1.9 g/dL (ref 1.5–4.5)
Glucose: 90 mg/dL (ref 65–99)
Potassium: 4.3 mmol/L (ref 3.5–5.2)
Sodium: 142 mmol/L (ref 134–144)
Total Protein: 6.4 g/dL (ref 6.0–8.5)

## 2018-05-11 LAB — LIPID PANEL WITH LDL/HDL RATIO
Cholesterol, Total: 204 mg/dL — ABNORMAL HIGH (ref 100–199)
HDL: 50 mg/dL (ref >39)
LDL Calculated: 128 mg/dL — ABNORMAL HIGH (ref 0–99)
LDl/HDL Ratio: 2.6 ratio (ref 0.0–3.6)
Triglycerides: 128 mg/dL (ref 0–149)
VLDL Cholesterol Cal: 26 mg/dL (ref 5–40)

## 2018-05-11 LAB — TSH: TSH: 1.02 u[IU]/mL (ref 0.450–4.500)

## 2018-05-11 LAB — PSA: Prostate Specific Ag, Serum: 0.1 ng/mL (ref 0.0–4.0)

## 2018-05-11 NOTE — Telephone Encounter (Signed)
-----   Message from Jerrol Banana., MD sent at 05/11/2018  8:33 AM EDT ----- Labs stable.  All in normal range.

## 2018-05-11 NOTE — Telephone Encounter (Signed)
[id number] 

## 2018-05-12 NOTE — Telephone Encounter (Signed)
LMTCB 05/12/2018   Thanks,   -Laura  

## 2018-05-16 NOTE — Telephone Encounter (Signed)
Unable to contact the patient. Letter mailed.  

## 2018-05-20 ENCOUNTER — Encounter: Payer: Self-pay | Admitting: Family Medicine

## 2018-05-20 ENCOUNTER — Ambulatory Visit (INDEPENDENT_AMBULATORY_CARE_PROVIDER_SITE_OTHER): Payer: BC Managed Care – PPO | Admitting: Family Medicine

## 2018-05-20 ENCOUNTER — Other Ambulatory Visit: Payer: Self-pay

## 2018-05-20 VITALS — BP 102/58 | HR 38 | Temp 100.0°F | Resp 16 | Wt 334.0 lb

## 2018-05-20 DIAGNOSIS — J101 Influenza due to other identified influenza virus with other respiratory manifestations: Secondary | ICD-10-CM

## 2018-05-20 DIAGNOSIS — J40 Bronchitis, not specified as acute or chronic: Secondary | ICD-10-CM

## 2018-05-20 LAB — POCT INFLUENZA A/B
Influenza A, POC: POSITIVE — AB
Influenza B, POC: NEGATIVE

## 2018-05-20 MED ORDER — AMOXICILLIN-POT CLAVULANATE 875-125 MG PO TABS: 1.0000 | ORAL_TABLET | Freq: Two times a day (BID) | ORAL | 0 refills | Status: DC

## 2018-05-20 MED ORDER — BALOXAVIR MARBOXIL(80 MG DOSE) 2 X 40 MG PO TBPK: 2.0000 | ORAL_TABLET | Freq: Once | ORAL | 0 refills | Status: AC

## 2018-05-20 NOTE — Progress Notes (Signed)
Patient: Terry Horn. Male    DOB: 1960-08-21   58 y.o.   MRN: [id number] Visit Date: 05/20/2018  Today's Provider: Vernie Murders, PA   Chief Complaint  Patient presents with  . Sinusitis   Subjective:     Sinusitis  This is a new problem. The current episode started in the past 7 days (2 days). There has been no fever. The pain is mild. Associated symptoms include chills, congestion, headaches, shortness of breath, sinus pressure and sneezing. Pertinent negatives include no coughing, diaphoresis, ear pain, hoarse voice, neck pain, sore throat or swollen glands. Treatments tried: Allegra  The treatment provided no relief.   Patient has had sinus pressure and pain for 2 days. Patient also has symptoms of sinus congestion, ear congestion, headaches, sneezing, wheezing and shortness of breath. Patient has been taking Allegra with no relief.  Past Medical History:  Diagnosis Date  . Anxiety    just recently  . Arthritis   . Cancer ([place])    skin cancer removed  . GERD (gastroesophageal reflux disease)   . Hypertension   . Pneumonia   . Sleep apnea    with cpap   Past Surgical History:  Procedure Laterality Date  . EXCISION OF SKIN TAG N/A 02/06/2016   Procedure: EXCISION OF SKIN TAG;  Surgeon: [name], MD;  Location: [facility] ORS;  Service: General;  Laterality: N/A;  . TONSILLECTOMY     Family History  Problem Relation Age of Onset  . Bipolar disorder Mother   . Thyroid disease Mother   . Depression Sister   . Prostate cancer Paternal Grandfather   . Prostate cancer Paternal Uncle    No Known Allergies  Current Outpatient Medications:  .  aspirin 81 MG tablet, Take 81 mg by mouth daily. , Disp: , Rfl:  .  Azelastine-Fluticasone (DYMISTA) 137-50 MCG/ACT SUSP, Place 2 sprays into the nose daily., Disp: 1 Bottle, Rfl: 3 .  cetirizine-pseudoephedrine (ZYRTEC-D ALLERGY & CONGESTION) 5-120 MG tablet, Take 1 tablet by mouth 2 (two) times daily. , Disp: ,  Rfl:  .  Cholecalciferol 1000 UNITS tablet, Take 1,000 Units by mouth daily. , Disp: , Rfl:  .  finasteride (PROSCAR) 5 MG tablet, TAKE ONE TABLET BY MOUTH DAILY, Disp: 30 tablet, Rfl: 11 .  gabapentin (NEURONTIN) 300 MG capsule, Take 1 capsule (300 mg total) by mouth 2 (two) times daily., Disp: 180 capsule, Rfl: 3 .  losartan-hydrochlorothiazide (HYZAAR) 100-12.5 MG tablet, TAKE ONE TABLET BY MOUTH DAILY, Disp: 90 tablet, Rfl: 2 .  Misc Natural Products (GLUCOSAMINE CHONDROITIN COMPLX) TABS, Take 1 tablet by mouth daily. , Disp: , Rfl:  .  Multiple Vitamin tablet, Take 1 tablet by mouth daily. , Disp: , Rfl:  .  OMEGA-3 FATTY ACIDS PO, Take 1 tablet by mouth daily. , Disp: , Rfl:  .  pravastatin (PRAVACHOL) 20 MG tablet, Take 20 mg by mouth daily., Disp: , Rfl:  .  tamsulosin (FLOMAX) 0.4 MG CAPS capsule, TAKE ONE CAPSULE BY MOUTH DAILY, Disp: 90 capsule, Rfl: 3 .  Turmeric, Curcuma Longa, POWD, Take by mouth. , Disp: , Rfl:  .  vitamin C (ASCORBIC ACID) 500 MG tablet, Take 500 mg by mouth daily. , Disp: , Rfl:  .  vitamin E 400 UNIT capsule, Take 400 Units by mouth daily. , Disp: , Rfl:  .  oseltamivir (TAMIFLU) 75 MG capsule, Take 1 capsule (75 mg total) by mouth 2 (two) times daily. (  Patient not taking: Reported on 05/20/2018), Disp: 10 capsule, Rfl: 0  Review of Systems  Constitutional: Positive for chills. Negative for appetite change, diaphoresis and fever.  HENT: Positive for congestion, rhinorrhea, sinus pressure, sinus pain and sneezing. Negative for ear pain, hoarse voice and sore throat.   Respiratory: Positive for shortness of breath. Negative for cough, chest tightness and wheezing.   Cardiovascular: Negative for chest pain and palpitations.  Gastrointestinal: Negative for abdominal pain, nausea and vomiting.  Musculoskeletal: Negative for neck pain.  Neurological: Positive for headaches.   Social History   Tobacco Use  . Smoking status: Former Smoker    Years: 22.00    Last  attempt to quit: 09/05/1996    Years since quitting: 21.7  . Smokeless tobacco: Never Used  . Tobacco comment: Quit in 1998  Substance Use Topics  . Alcohol use: Yes    Comment: occasionally     Objective:   BP (!) 102/58 (BP Location: Left Arm, Patient Position: Sitting, Cuff Size: Large)   Pulse (!) 38   Temp 100 F (37.8 C) (Oral)   Resp 16   Wt (!) 334 lb (151.5 kg)   SpO2 98%   BMI 46.58 kg/m  Vitals:   05/20/18 0936  BP: (!) 102/58  Pulse: (!) 38  Resp: 16  Temp: 100 F (37.8 C)  TempSrc: Oral  SpO2: 98%  Weight: (!) 334 lb (151.5 kg)   Physical Exam Constitutional:      General: He is not in acute distress.    Appearance: He is well-developed. He is ill-appearing.  HENT:     Head: Normocephalic and atraumatic.     Right Ear: Hearing and tympanic membrane normal.     Left Ear: Hearing and tympanic membrane normal.     Nose: Nose normal.     Mouth/Throat:     Pharynx: Oropharynx is clear.  Eyes:     General: Lids are normal. No scleral icterus.       Right eye: No discharge.        Left eye: No discharge.     Conjunctiva/sclera: Conjunctivae normal.  Cardiovascular:     Rate and Rhythm: Normal rate and regular rhythm.     Heart sounds: Normal heart sounds.  Pulmonary:     Effort: Pulmonary effort is normal. No respiratory distress.     Breath sounds: Normal breath sounds. No wheezing.  Abdominal:     General: Bowel sounds are normal.     Palpations: Abdomen is soft.  Musculoskeletal: Normal range of motion.  Skin:    Findings: No lesion or rash.  Neurological:     Mental Status: He is alert and oriented to person, place, and time.  Psychiatric:        Speech: Speech normal.        Behavior: Behavior normal.        Thought Content: Thought content normal.       Assessment & Plan    1. Influenza A Onset with slight cough and sore throat with temp up to 100 over the past 1-2 days. Two son's (over age 70 and living with him and his wife) are having  similar general malaise with headache. One son is a Engineer, structural and is waiting for coronavirus test results (his flu test was negative). This patient has not been traveling out of state. Influenza test is positive for "A" today. Will treat with Xofluza and recommended he proceed with isolation protocols. Increase fluid intake and  may use Tylenol or Advil prn. - Baloxavir Marboxil,80 MG Dose, (XOFLUZA) 2 x 40 MG TBPK; Take 2 tablets by mouth once for 1 dose.  Dispense: 2 each; Refill: 0 - POCT Influenza A/B  2. Bronchitis Feeling like past episodes of bronchitis today. No sputum production but temp 100. May use Mucinex-D and Delsym prn congestion or cough. Given Augmentin to have ready to take if purulent sputum starts over the weekend. Call or return if no better in 5-7 days. - amoxicillin-clavulanate (AUGMENTIN) 875-125 MG tablet; Take 1 tablet by mouth 2 (two) times daily.  Dispense: 20 tablet; Refill: [place], [place]  [facility]

## 2018-09-08 ENCOUNTER — Ambulatory Visit: Payer: BC Managed Care – PPO | Admitting: Family Medicine

## 2018-09-14 NOTE — Progress Notes (Signed)
Patient: Terry Horn. Male    DOB: 03-24-1960   58 y.o.   MRN: [id number] Visit Date: 09/15/2018  Today's Provider: Wilhemena Durie, MD   Chief Complaint  Patient presents with  . Back Pain   Subjective:     Back Pain This is a recurrent (at last office visit Gabapentin was increased to 300mg ) problem. The problem has been waxing and waning since onset. Pertinent negatives include no fever or headaches.  Pain a little better on Gabapentin. Patient reports that this has helped with his symptoms.    Hypertension, follow-up:  BP Readings from Last 3 Encounters:  09/15/18 122/68  05/20/18 (!) 102/58  05/10/18 120/62    He was last seen for hypertension 3 months ago.  BP at that visit was 102/58. Management since that visit includes no changes. He reports good compliance with treatment. He is not having side effects.  He is not exercising. He is adherent to low salt diet.   Outside blood pressures are checked occasionally. He is experiencing none.  Patient denies exertional chest pressure/discomfort, lower extremity edema and palpitations.    Weight trend: stable Wt Readings from Last 3 Encounters:  09/15/18 (!) 348 lb (157.9 kg)  05/20/18 (!) 334 lb (151.5 kg)  05/10/18 (!) 336 lb (152.4 kg)    Current diet: well balanced   No Known Allergies   Current Outpatient Medications:  .  aspirin 81 MG tablet, Take 81 mg by mouth daily. , Disp: , Rfl:  .  Azelastine-Fluticasone (DYMISTA) 137-50 MCG/ACT SUSP, Place 2 sprays into the nose daily., Disp: 1 Bottle, Rfl: 3 .  cetirizine-pseudoephedrine (ZYRTEC-D ALLERGY & CONGESTION) 5-120 MG tablet, Take 1 tablet by mouth 2 (two) times daily. , Disp: , Rfl:  .  Cholecalciferol 1000 UNITS tablet, Take 1,000 Units by mouth daily. , Disp: , Rfl:  .  finasteride (PROSCAR) 5 MG tablet, TAKE ONE TABLET BY MOUTH DAILY, Disp: 30 tablet, Rfl: 11 .  gabapentin (NEURONTIN) 300 MG capsule, Take 1 capsule (300 mg total)  by mouth 2 (two) times daily., Disp: 180 capsule, Rfl: 3 .  losartan-hydrochlorothiazide (HYZAAR) 100-12.5 MG tablet, TAKE ONE TABLET BY MOUTH DAILY, Disp: 90 tablet, Rfl: 2 .  Misc Natural Products (GLUCOSAMINE CHONDROITIN COMPLX) TABS, Take 1 tablet by mouth daily. , Disp: , Rfl:  .  Multiple Vitamin tablet, Take 1 tablet by mouth daily. , Disp: , Rfl:  .  OMEGA-3 FATTY ACIDS PO, Take 1 tablet by mouth daily. , Disp: , Rfl:  .  tamsulosin (FLOMAX) 0.4 MG CAPS capsule, TAKE ONE CAPSULE BY MOUTH DAILY, Disp: 90 capsule, Rfl: 3 .  Turmeric, Curcuma Longa, POWD, Take by mouth. , Disp: , Rfl:  .  vitamin C (ASCORBIC ACID) 500 MG tablet, Take 500 mg by mouth daily. , Disp: , Rfl:  .  vitamin E 400 UNIT capsule, Take 400 Units by mouth daily. , Disp: , Rfl:  .  amoxicillin-clavulanate (AUGMENTIN) 875-125 MG tablet, Take 1 tablet by mouth 2 (two) times daily., Disp: 20 tablet, Rfl: 0 .  oseltamivir (TAMIFLU) 75 MG capsule, Take 1 capsule (75 mg total) by mouth 2 (two) times daily. (Patient not taking: Reported on 05/20/2018), Disp: 10 capsule, Rfl: 0 .  pravastatin (PRAVACHOL) 20 MG tablet, Take 20 mg by mouth daily., Disp: , Rfl:   Review of Systems  Constitutional: Negative for activity change, appetite change, fatigue, fever and unexpected weight change.  Eyes: Negative.  Respiratory: Negative.  Negative for cough.   Cardiovascular: Negative.   Endocrine: Negative.   Genitourinary: Negative.   Musculoskeletal: Positive for back pain.  Allergic/Immunologic: Negative for environmental allergies.  Neurological: Negative for dizziness, light-headedness and headaches.  Psychiatric/Behavioral: Negative for agitation, self-injury, sleep disturbance and suicidal ideas. The patient is not nervous/anxious.     Social History   Tobacco Use  . Smoking status: Former Smoker    Years: 22.00    Quit date: 09/05/1996    Years since quitting: 22.0  . Smokeless tobacco: Never Used  . Tobacco comment: Quit  in 1998  Substance Use Topics  . Alcohol use: Yes    Comment: occasionally      Objective:   BP 122/68 (BP Location: Right Arm, Patient Position: Sitting, Cuff Size: Large)   Pulse 68   Temp 98.6 F (37 C)   Resp 16   Ht 5\' 11"  (1.803 m)   Wt (!) 348 lb (157.9 kg)   SpO2 98%   BMI 48.54 kg/m  Vitals:   09/15/18 0814  BP: 122/68  Pulse: 68  Resp: 16  Temp: 98.6 F (37 C)  SpO2: 98%  Weight: (!) 348 lb (157.9 kg)  Height: 5\' 11"  (1.803 m)     Physical Exam Vitals signs reviewed.  Constitutional:      Appearance: He is well-developed.  HENT:     Head: Normocephalic and atraumatic.     Right Ear: External ear normal.     Left Ear: External ear normal.     Nose: Nose normal.  Eyes:     General: No scleral icterus.    Conjunctiva/sclera: Conjunctivae normal.  Neck:     Thyroid: No thyromegaly.  Cardiovascular:     Rate and Rhythm: Normal rate and regular rhythm.     Heart sounds: Normal heart sounds.  Pulmonary:     Effort: Pulmonary effort is normal.     Breath sounds: Normal breath sounds.  Abdominal:     Palpations: Abdomen is soft.  Skin:    General: Skin is warm and dry.  Neurological:     Mental Status: He is alert and oriented to person, place, and time.  Psychiatric:        Behavior: Behavior normal.        Thought Content: Thought content normal.        Judgment: Judgment normal.      No results found for any visits on 09/15/18.     Assessment & Plan    1. Chronic low back pain (Primary Area of Pain) (Bilateral) w/ sciatica (Left) Increase gabapentin from 300BID to 300 in am and 600 in pm. - gabapentin (NEURONTIN) 300 MG capsule; Take one tablet in the AM and two tablets at bedtime  Dispense: 90 capsule; Refill: 3  2. Essential (primary) hypertension Controlled.  3. Obstructive sleep apnea syndrome On CPAP  4. Osteoarthritis of spine with radiculopathy, lumbosacral region   5. Class 3 severe obesity due to excess calories with  serious comorbidity and body mass index (BMI) of 45.0 to 49.9 in adult [facility]) Lifestyle stressed.      Cranford Mon, MD  [facility]

## 2018-09-15 ENCOUNTER — Ambulatory Visit: Payer: BC Managed Care – PPO | Admitting: Family Medicine

## 2018-09-15 ENCOUNTER — Other Ambulatory Visit: Payer: Self-pay

## 2018-09-15 ENCOUNTER — Encounter: Payer: Self-pay | Admitting: Family Medicine

## 2018-09-15 VITALS — BP 122/68 | HR 68 | Temp 98.6°F | Resp 16 | Ht 71.0 in | Wt 348.0 lb

## 2018-09-15 DIAGNOSIS — G4733 Obstructive sleep apnea (adult) (pediatric): Secondary | ICD-10-CM

## 2018-09-15 DIAGNOSIS — I10 Essential (primary) hypertension: Secondary | ICD-10-CM | POA: Diagnosis not present

## 2018-09-15 DIAGNOSIS — M4727 Other spondylosis with radiculopathy, lumbosacral region: Secondary | ICD-10-CM | POA: Diagnosis not present

## 2018-09-15 DIAGNOSIS — Z6841 Body Mass Index (BMI) 40.0 and over, adult: Secondary | ICD-10-CM

## 2018-09-15 DIAGNOSIS — M5442 Lumbago with sciatica, left side: Secondary | ICD-10-CM | POA: Diagnosis not present

## 2018-09-15 DIAGNOSIS — G8929 Other chronic pain: Secondary | ICD-10-CM

## 2018-09-15 MED ORDER — GABAPENTIN 300 MG PO CAPS: ORAL_CAPSULE | ORAL | 3 refills | Status: DC

## 2018-09-23 ENCOUNTER — Encounter: Payer: Self-pay | Admitting: General Surgery

## 2018-09-30 ENCOUNTER — Other Ambulatory Visit: Payer: Self-pay | Admitting: Family Medicine

## 2018-09-30 DIAGNOSIS — R351 Nocturia: Secondary | ICD-10-CM

## 2018-10-21 ENCOUNTER — Encounter: Payer: Self-pay | Admitting: Family Medicine

## 2018-10-29 ENCOUNTER — Other Ambulatory Visit: Payer: Self-pay | Admitting: Family Medicine

## 2018-11-25 ENCOUNTER — Other Ambulatory Visit: Payer: Self-pay | Admitting: Neurosurgery

## 2018-11-25 DIAGNOSIS — M4802 Spinal stenosis, cervical region: Secondary | ICD-10-CM

## 2018-11-25 DIAGNOSIS — M47816 Spondylosis without myelopathy or radiculopathy, lumbar region: Secondary | ICD-10-CM

## 2018-12-23 ENCOUNTER — Other Ambulatory Visit: Payer: Self-pay | Admitting: Family Medicine

## 2018-12-23 DIAGNOSIS — R350 Frequency of micturition: Secondary | ICD-10-CM

## 2018-12-23 DIAGNOSIS — N401 Enlarged prostate with lower urinary tract symptoms: Secondary | ICD-10-CM

## 2018-12-28 ENCOUNTER — Ambulatory Visit
Admission: RE | Admit: 2018-12-28 | Discharge: 2018-12-28 | Disposition: A | Discharge status: Home / Self Care | Payer: BC Managed Care – PPO | Source: Ambulatory Visit | Attending: Neurosurgery | Admitting: Neurosurgery

## 2018-12-28 ENCOUNTER — Other Ambulatory Visit: Payer: Self-pay

## 2018-12-28 DIAGNOSIS — M47816 Spondylosis without myelopathy or radiculopathy, lumbar region: Secondary | ICD-10-CM | POA: Insufficient documentation

## 2018-12-28 DIAGNOSIS — M4802 Spinal stenosis, cervical region: Secondary | ICD-10-CM | POA: Diagnosis not present

## 2019-01-09 ENCOUNTER — Telehealth: Payer: Self-pay

## 2019-01-09 NOTE — Telephone Encounter (Signed)
Message left for patient to call back to see where he wants to get his colonoscopy done.

## 2019-01-30 ENCOUNTER — Encounter: Payer: Self-pay | Admitting: Family Medicine

## 2019-02-18 ENCOUNTER — Other Ambulatory Visit: Payer: Self-pay | Admitting: Family Medicine

## 2019-02-18 DIAGNOSIS — G8929 Other chronic pain: Secondary | ICD-10-CM

## 2019-02-28 ENCOUNTER — Encounter: Payer: Self-pay | Admitting: Family Medicine

## 2019-03-13 NOTE — Progress Notes (Deleted)
Patient: Terry Horn. Male    DOB: March 14, 1960   59 y.o.   MRN: [id number] Visit Date: 03/13/2019  Today's Provider: Wilhemena Durie, MD   No chief complaint on file.  Subjective:    Virtual Visit via Telephone Note  I connected with [name]. on 03/13/19 at  8:00 AM EST by telephone and verified that I am speaking with the correct person using two identifiers.  Location: Patient: *** Provider: ***   I discussed the limitations, risks, security and privacy concerns of performing an evaluation and management service by telephone and the availability of in person appointments. I also discussed with the patient that there may be a patient responsible charge related to this service. The patient expressed understanding and agreed to proceed.    HPI  No Known Allergies   Current Outpatient Medications:  .  amoxicillin-clavulanate (AUGMENTIN) 875-125 MG tablet, Take 1 tablet by mouth 2 (two) times daily., Disp: 20 tablet, Rfl: 0 .  aspirin 81 MG tablet, Take 81 mg by mouth daily. , Disp: , Rfl:  .  Azelastine-Fluticasone (DYMISTA) 137-50 MCG/ACT SUSP, Place 2 sprays into the nose daily., Disp: 1 Bottle, Rfl: 3 .  cetirizine-pseudoephedrine (ZYRTEC-D ALLERGY & CONGESTION) 5-120 MG tablet, Take 1 tablet by mouth 2 (two) times daily. , Disp: , Rfl:  .  Cholecalciferol 1000 UNITS tablet, Take 1,000 Units by mouth daily. , Disp: , Rfl:  .  finasteride (PROSCAR) 5 MG tablet, TAKE ONE TABLET BY MOUTH DAILY, Disp: 90 tablet, Rfl: 10 .  gabapentin (NEURONTIN) 300 MG capsule, TAKE ONE CAPSULE BY MOUTH EVERY MORNING AND TAKE TWO CAPSULES BY MOUTH EVERY NIGHT AT BEDTIME, Disp: 90 capsule, Rfl: 2 .  losartan-hydrochlorothiazide (HYZAAR) 100-12.5 MG tablet, TAKE ONE TABLET BY MOUTH DAILY, Disp: 90 tablet, Rfl: 1 .  Misc Natural Products (GLUCOSAMINE CHONDROITIN COMPLX) TABS, Take 1 tablet by mouth daily. , Disp: , Rfl:  .  Multiple Vitamin tablet, Take 1 tablet by mouth  daily. , Disp: , Rfl:  .  OMEGA-3 FATTY ACIDS PO, Take 1 tablet by mouth daily. , Disp: , Rfl:  .  oseltamivir (TAMIFLU) 75 MG capsule, Take 1 capsule (75 mg total) by mouth 2 (two) times daily. (Patient not taking: Reported on 05/20/2018), Disp: 10 capsule, Rfl: 0 .  pravastatin (PRAVACHOL) 20 MG tablet, Take 20 mg by mouth daily., Disp: , Rfl:  .  tamsulosin (FLOMAX) 0.4 MG CAPS capsule, TAKE ONE CAPSULE BY MOUTH DAILY, Disp: 90 capsule, Rfl: 2 .  Turmeric, Curcuma Longa, POWD, Take by mouth. , Disp: , Rfl:  .  vitamin C (ASCORBIC ACID) 500 MG tablet, Take 500 mg by mouth daily. , Disp: , Rfl:  .  vitamin E 400 UNIT capsule, Take 400 Units by mouth daily. , Disp: , Rfl:   Review of Systems  Social History   Tobacco Use  . Smoking status: Former Smoker    Years: 22.00    Quit date: 09/05/1996    Years since quitting: 22.5  . Smokeless tobacco: Never Used  . Tobacco comment: Quit in 1998  Substance Use Topics  . Alcohol use: Yes    Comment: occasionally      Objective:   There were no vitals taken for this visit. There were no vitals filed for this visit.There is no height or weight on file to calculate BMI.   Physical Exam   No results found for any visits on 03/14/19.  Assessment & Plan    I discussed the assessment and treatment plan with the patient. The patient was provided an opportunity to ask questions and all were answered. The patient agreed with the plan and demonstrated an understanding of the instructions.   The patient was advised to call back or seek an in-person evaluation if the symptoms worsen or if the condition fails to improve as anticipated.  I provided *** minutes of non-face-to-face time during this encounter.    Richard Cranford Mon, MD  [facility]

## 2019-03-14 ENCOUNTER — Ambulatory Visit: Payer: BC Managed Care – PPO | Admitting: Family Medicine

## 2019-05-07 ENCOUNTER — Other Ambulatory Visit: Payer: Self-pay | Admitting: Family Medicine

## 2019-05-07 NOTE — Telephone Encounter (Signed)
<  3 months overdue- 30 day courtesy fill given. Pt needs to call office and make an appointment Requested Prescriptions  Pending Prescriptions Disp Refills  . losartan-hydrochlorothiazide (HYZAAR) 100-12.5 MG tablet [Pharmacy Med Name: LOSARTAN-HCTZ 100-12.5 MG TAB] 30 tablet 0    Sig: TAKE ONE TABLET BY MOUTH DAILY     Cardiovascular: ARB + Diuretic Combos Failed - 05/07/2019  6:51 AM      Failed - K in normal range and within 180 days    Potassium  Date Value Ref Range Status  05/10/2018 4.3 3.5 - 5.2 mmol/L Final         Failed - Na in normal range and within 180 days    Sodium  Date Value Ref Range Status  05/10/2018 142 134 - 144 mmol/L Final         Failed - Cr in normal range and within 180 days    Creatinine, Ser  Date Value Ref Range Status  05/10/2018 0.72 (L) 0.76 - 1.27 mg/dL Final         Failed - Ca in normal range and within 180 days    Calcium  Date Value Ref Range Status  05/10/2018 9.3 8.7 - 10.2 mg/dL Final         Failed - Valid encounter within last 6 months    Recent Outpatient Visits          7 months ago Essential (primary) hypertension   [facility] Jerrol Banana., MD   11 months ago Influenza A   [facility] Chrism[place], Vickki Muff, [place]   12 months ago Annual physical exam   [facility] Jerrol Banana., MD   1 year ago [place] Jerrol Banana., MD   1 year ago Essential (primary) hypertension   [facility] Jerrol Banana., MD             Passed - Patient is not pregnant      Passed - Last BP in normal range    BP Readings from Last 1 Encounters:  09/15/18 122/68

## 2019-05-14 ENCOUNTER — Other Ambulatory Visit: Payer: Self-pay | Admitting: Family Medicine

## 2019-05-14 DIAGNOSIS — I10 Essential (primary) hypertension: Secondary | ICD-10-CM

## 2019-05-15 MED ORDER — LOSARTAN POTASSIUM-HCTZ 100-12.5 MG PO TABS: 1.0000 | ORAL_TABLET | Freq: Every day | ORAL | 0 refills | Status: DC

## 2019-05-15 NOTE — Telephone Encounter (Signed)
Patient's last office visit was 09/15/18. Patient has upcoming office visit on 3/25, 30 day supply of medication sent to pharmacy.

## 2019-05-23 ENCOUNTER — Other Ambulatory Visit: Payer: Self-pay | Admitting: Family Medicine

## 2019-05-23 DIAGNOSIS — G8929 Other chronic pain: Secondary | ICD-10-CM

## 2019-05-23 NOTE — Telephone Encounter (Signed)
Requested Prescriptions  Pending Prescriptions Disp Refills  . gabapentin (NEURONTIN) 300 MG capsule [Pharmacy Med Name: GABAPENTIN 300 MG CAPSULE] 270 capsule 1    Sig: TAKE ONE CAPSULE BY MOUTH EVERY MORNING AND TAKE TWO CAPSULES BY MOUTH AT BEDTIME     Neurology: Anticonvulsants - gabapentin Passed - 05/23/2019  6:20 AM      Passed - Valid encounter within last 12 months    Recent Outpatient Visits          8 months ago Essential (primary) hypertension   [facility] Jerrol Banana., MD   1 year ago Influenza [place], Vickki Muff, [place]   1 year ago Annual physical exam   [facility] Jerrol Banana., MD   1 year ago Influenza B   [facility] Jerrol Banana., MD   1 year ago Essential (primary) hypertension   [facility] Jerrol Banana., MD

## 2019-05-24 NOTE — Progress Notes (Signed)
Patient: Terry Midyette., Male    DOB: March 20, 1960, 59 y.o.   MRN: [id number] Visit Date: 05/25/2019  Today's Provider: Wilhemena Durie, MD   Chief Complaint  Patient presents with  . Annual Exam   Subjective:     Annual physical exam Terry Murdough. is a 59 y.o. male who presents today for health maintenance and complete physical. He feels fairly well. He reports he is exercising. He reports he is sleeping fairly well. He has now followed by Dr. Arnoldo Morale from neurosurgery for chronic back and neck issues.  He is looking to have surgery this year for both but has had to take care of his wife who had back surgery than his son who has hip surgery and his mother-in-law's progressive dementia.  His mother is in the nursing home and in failing health and his father is at home and in failing health.  We discussed all of these issues.  Had his first vaccine.  Overall he feels very well other than his chronic pain in his hands tingling.  He saw Dr. Manuella Ghazi in 2018 for presumed carpal tunnel syndrome which think was confirmed.  Injections did not help.   -----------------------------------------------------------------  Colonoscopy: 02/06/2016  Review of Systems  Constitutional: Negative.   HENT: Positive for congestion, sinus pressure and sneezing.   Eyes: Negative.   Respiratory: Positive for apnea.   Cardiovascular: Negative.   Gastrointestinal: Negative.   Endocrine: Negative.   Genitourinary: Positive for difficulty urinating.  Musculoskeletal: Positive for arthralgias, back pain, neck pain and neck stiffness.  Skin: Negative.   Allergic/Immunologic: Positive for environmental allergies.  Neurological: Positive for numbness.  Hematological: Negative.   Psychiatric/Behavioral: Negative.     Social History      He  reports that he quit smoking about 22 years ago. He quit after 22.00 years of use. He has never used smokeless tobacco. He reports current alcohol use. He  reports that he does not use drugs.       Social History   Socioeconomic History  . Marital status: Married    Spouse name: Not on file  . Number of children: Not on file  . Years of education: Not on file  . Highest education level: Not on file  Occupational History  . Not on file  Tobacco Use  . Smoking status: Former Smoker    Years: 22.00    Quit date: 09/05/1996    Years since quitting: 22.7  . Smokeless tobacco: Never Used  . Tobacco comment: Quit in 1998  Substance and Sexual Activity  . Alcohol use: Yes    Comment: occasionally  . Drug use: No  . Sexual activity: Not on file  Other Topics Concern  . Not on file  Social History Narrative  . Not on file   Social Determinants of Health   Financial Resource Strain:   . Difficulty of Paying Living Expenses:   Food Insecurity:   . Worried About Charity fundraiser in the Last Year:   . Arboriculturist in the Last Year:   Transportation Needs:   . Film/video editor (Medical):   Marland Kitchen Lack of Transportation (Non-Medical):   Physical Activity:   . Days of Exercise per Week:   . Minutes of Exercise per Session:   Stress:   . Feeling of Stress :   Social Connections:   . Frequency of Communication with Friends and Family:   . Frequency of  Social Gatherings with Friends and Family:   . Attends Religious Services:   . Active Member of Clubs or Organizations:   . Attends Archivist Meetings:   Marland Kitchen Marital Status:     Past Medical History:  Diagnosis Date  . Anxiety    just recently  . Arthritis   . Cancer ([place])    skin cancer removed  . GERD (gastroesophageal reflux disease)   . Hypertension   . Pneumonia   . Sleep apnea    with cpap     Patient Active Problem List   Diagnosis Date Noted  . Chronic knee pain (Bilateral) 09/09/2017  . Osteoarthritis of knee (Secondary) (Bilateral) 09/09/2017  . Congenital Lumbar central spinal stenosis 09/09/2017  . Cervical central spinal stenosis 09/09/2017    . Carpal tunnel syndrome (Bilateral) 09/09/2017  . Abnormal nerve conduction studies 09/09/2017  . Osteoarthritis of spine with radiculopathy, lumbosacral region 09/09/2017  . Osteoarthritis of facet joint of lumbar spine 09/09/2017  . Osteoarthritis involving multiple joints on both sides of body 09/09/2017  . Cervicalgia 09/09/2017  . Chronic neck pain 09/09/2017  . Numbness of upper extremity (Bilateral) 09/09/2017  . Numbness of lower extremity (Left) 09/09/2017  . Spondylosis without myelopathy or radiculopathy, lumbosacral region 09/09/2017  . Lumbar facet syndrome (Midline pain pattern) (Bilateral) 09/09/2017  . Lumbar facet arthropathy (L3-4, L4-L5, and L5-S1) (Bilateral) 09/09/2017  . Chronic midline low back pain without sciatica 09/09/2017  . Hyperglycemia 02/20/2016  . Chronic low back pain (Primary Area of Pain) (Bilateral) w/ sciatica (Left) 02/20/2016  . DDD (degenerative disc disease), lumbar 02/20/2016  . Lumbar spondylosis 02/20/2016  . DDD (degenerative disc disease), cervical 02/20/2016  . Cervical spondylolysis 02/20/2016  . History of colonic polyps 02/06/2016  . Encounter for screening colonoscopy 01/21/2016  . Mixed hyperlipidemia 10/01/2015  . Chest pressure 07/26/2015  . Paresthesia 07/26/2015  . Arthritis 01/02/2015  . Body mass index (BMI) of 50-59.9 in adult ([place]) 01/02/2015  . Essential (primary) hypertension 01/02/2015  . Adiposity 01/02/2015  . Apnea, sleep 01/02/2015  . Avitaminosis D 01/02/2015    Past Surgical History:  Procedure Laterality Date  . EXCISION OF SKIN TAG N/A 02/06/2016   Procedure: EXCISION OF SKIN TAG;  Surgeon: [name], MD;  Location: [facility] ORS;  Service: General;  Laterality: N/A;  . TONSILLECTOMY      Family History        Family Status  Relation Name Status  . Mother  Alive  . Sister  Alive  . PGF  Deceased  . Father  Alive  . Son  Alive  . Son  Alive  . Annamarie Major  (Not Specified)        His family  history includes Bipolar disorder in his mother; Depression in his sister; Prostate cancer in his paternal grandfather and paternal uncle; Thyroid disease in his mother.      No Known Allergies   Current Outpatient Medications:  .  amoxicillin-clavulanate (AUGMENTIN) 875-125 MG tablet, Take 1 tablet by mouth 2 (two) times daily., Disp: 20 tablet, Rfl: 0 .  aspirin 81 MG tablet, Take 81 mg by mouth daily. , Disp: , Rfl:  .  Azelastine-Fluticasone (DYMISTA) 137-50 MCG/ACT SUSP, Place 2 sprays into the nose daily., Disp: 1 Bottle, Rfl: 3 .  cetirizine-pseudoephedrine (ZYRTEC-D ALLERGY & CONGESTION) 5-120 MG tablet, Take 1 tablet by mouth 2 (two) times daily. , Disp: , Rfl:  .  Cholecalciferol 1000 UNITS tablet, Take 1,000 Units by mouth daily. ,  Disp: , Rfl:  .  finasteride (PROSCAR) 5 MG tablet, TAKE ONE TABLET BY MOUTH DAILY, Disp: 90 tablet, Rfl: 10 .  gabapentin (NEURONTIN) 300 MG capsule, TAKE ONE CAPSULE BY MOUTH EVERY MORNING AND TAKE TWO CAPSULES BY MOUTH AT BEDTIME, Disp: 270 capsule, Rfl: 1 .  losartan-hydrochlorothiazide (HYZAAR) 100-12.5 MG tablet, Take 1 tablet by mouth daily., Disp: 90 tablet, Rfl: 3 .  Misc Natural Products (GLUCOSAMINE CHONDROITIN COMPLX) TABS, Take 1 tablet by mouth daily. , Disp: , Rfl:  .  Multiple Vitamin tablet, Take 1 tablet by mouth daily. , Disp: , Rfl:  .  OMEGA-3 FATTY ACIDS PO, Take 1 tablet by mouth daily. , Disp: , Rfl:  .  oseltamivir (TAMIFLU) 75 MG capsule, Take 1 capsule (75 mg total) by mouth 2 (two) times daily. (Patient not taking: Reported on 05/20/2018), Disp: 10 capsule, Rfl: 0 .  pravastatin (PRAVACHOL) 20 MG tablet, Take 20 mg by mouth daily., Disp: , Rfl:  .  tamsulosin (FLOMAX) 0.4 MG CAPS capsule, TAKE ONE CAPSULE BY MOUTH DAILY, Disp: 90 capsule, Rfl: 2 .  Turmeric, Curcuma Longa, POWD, Take by mouth. , Disp: , Rfl:  .  vitamin C (ASCORBIC ACID) 500 MG tablet, Take 500 mg by mouth daily. , Disp: , Rfl:  .  vitamin E 400 UNIT capsule,  Take 400 Units by mouth daily. , Disp: , Rfl:    Patient Care Team: Jerrol Banana., MD as PCP - General (Family Medicine) Jerrol Banana., MD (Family Medicine) Bary Castilla Forest Gleason, MD (General Surgery)    Objective:    Vitals: There were no vitals taken for this visit.  There were no vitals filed for this visit.   Physical Exam Vitals reviewed.  Constitutional:      Appearance: Normal appearance. He is obese.  HENT:     Head: Normocephalic and atraumatic.     Right Ear: Tympanic membrane, ear canal and external ear normal.     Left Ear: Tympanic membrane, ear canal and external ear normal.     Nose: Nose normal.     Mouth/Throat:     Mouth: Mucous membranes are moist.     Pharynx: Oropharynx is clear.  Eyes:     Extraocular Movements: Extraocular movements intact.     Conjunctiva/sclera: Conjunctivae normal.     Pupils: Pupils are equal, round, and reactive to light.  Cardiovascular:     Rate and Rhythm: Normal rate and regular rhythm.     Pulses: Normal pulses.     Heart sounds: Normal heart sounds.  Pulmonary:     Effort: Pulmonary effort is normal.     Breath sounds: Normal breath sounds.  Abdominal:     General: Abdomen is flat.     Palpations: Abdomen is soft.  Genitourinary:    Penis: Normal.      Testes: Normal.     Prostate: Normal.     Rectum: Normal.  Musculoskeletal:        General: Normal range of motion.     Cervical back: Normal range of motion and neck supple.  Skin:    General: Skin is warm and dry.  Neurological:     General: No focal deficit present.     Mental Status: He is alert and oriented to person, place, and time. Mental status is at baseline.     Comments: Negative tinels sign.Grip strength normal.  Psychiatric:        Mood and Affect: Mood normal.  Behavior: Behavior normal.        Thought Content: Thought content normal.        Judgment: Judgment normal.   ECG-NSR with no STTWCs   Depression Screen PHQ 2/9  Scores 05/10/2018 12/13/2017 11/23/2017 09/27/2017  PHQ - 2 Score 0 0 0 0       Assessment & Plan:     Routine Health Maintenance and Physical Exam  Exercise Activities and Dietary recommendations Goals   None     Immunization History  Administered Date(s) Administered  . Influenza,inj,Quad PF,6+ Mos 11/29/2017, 12/05/2018  . Pneumococcal Polysaccharide-23 12/21/1991  . Tdap 10/26/2007  . Zoster Recombinat (Shingrix) 05/14/2018, 09/09/2018    Health Maintenance  Topic Date Due  . Hepatitis C Screening  Never done  . HIV Screening  Never done  . TETANUS/TDAP  10/25/2017  . COLONOSCOPY  02/05/2026  . INFLUENZA VACCINE  Completed     Discussed health benefits of physical activity, and encouraged him to engage in regular exercise appropriate for his age and condition.   1. Annual physical exam  - EKG 12-Lead - TSH - CBC with Differential/Platelet - Comprehensive metabolic panel - Lipid panel - PSA - Hemoglobin A1c  2. Dysuria/BPH symptoms UA normal - POCT urinalysis dipstick  3. Obstructive sleep apnea syndrome On CPAP.  4. Essential (primary) hypertension Losartan HCT.  5. Chronic low back pain (Primary Area of Pain) (Bilateral) w/ sciatica (Left) Per Dr Arnoldo Morale.  6. Carpal tunnel syndrome (Bilateral) May need further evaluation  7. Lumbar spondylosis   8. DDD (degenerative disc disease), lumbar   9. DDD (degenerative disc disease), cervical   10. Mixed hyperlipidemia Labs  11. Hyperglycemia A1c  12. Class 3 severe obesity due to excess calories with serious comorbidity and body mass index (BMI) of 45.0 to 49.9 in adult [facility])   --------------------------------------------------------------------    Wilhemena Durie, MD  [facility]

## 2019-05-25 ENCOUNTER — Other Ambulatory Visit: Payer: Self-pay

## 2019-05-25 ENCOUNTER — Encounter: Payer: Self-pay | Admitting: Family Medicine

## 2019-05-25 ENCOUNTER — Ambulatory Visit (INDEPENDENT_AMBULATORY_CARE_PROVIDER_SITE_OTHER): Payer: BC Managed Care – PPO | Admitting: Family Medicine

## 2019-05-25 VITALS — BP 126/77 | HR 65 | Temp 96.9°F | Ht 71.0 in | Wt 343.8 lb

## 2019-05-25 DIAGNOSIS — M5030 Other cervical disc degeneration, unspecified cervical region: Secondary | ICD-10-CM

## 2019-05-25 DIAGNOSIS — G4733 Obstructive sleep apnea (adult) (pediatric): Secondary | ICD-10-CM | POA: Diagnosis not present

## 2019-05-25 DIAGNOSIS — R300 Dysuria: Secondary | ICD-10-CM

## 2019-05-25 DIAGNOSIS — M5442 Lumbago with sciatica, left side: Secondary | ICD-10-CM

## 2019-05-25 DIAGNOSIS — G8929 Other chronic pain: Secondary | ICD-10-CM

## 2019-05-25 DIAGNOSIS — M5136 Other intervertebral disc degeneration, lumbar region: Secondary | ICD-10-CM

## 2019-05-25 DIAGNOSIS — I10 Essential (primary) hypertension: Secondary | ICD-10-CM

## 2019-05-25 DIAGNOSIS — G5603 Carpal tunnel syndrome, bilateral upper limbs: Secondary | ICD-10-CM

## 2019-05-25 DIAGNOSIS — M47816 Spondylosis without myelopathy or radiculopathy, lumbar region: Secondary | ICD-10-CM

## 2019-05-25 DIAGNOSIS — Z0000 Encounter for general adult medical examination without abnormal findings: Secondary | ICD-10-CM

## 2019-05-25 DIAGNOSIS — E782 Mixed hyperlipidemia: Secondary | ICD-10-CM

## 2019-05-25 DIAGNOSIS — Z6841 Body Mass Index (BMI) 40.0 and over, adult: Secondary | ICD-10-CM

## 2019-05-25 DIAGNOSIS — R739 Hyperglycemia, unspecified: Secondary | ICD-10-CM

## 2019-05-25 LAB — POCT URINALYSIS DIPSTICK
Bilirubin, UA: NEGATIVE
Blood, UA: NEGATIVE
Glucose, UA: NEGATIVE
Ketones, UA: NEGATIVE
Leukocytes, UA: NEGATIVE
Nitrite, UA: NEGATIVE
Protein, UA: NEGATIVE
Spec Grav, UA: 1.01 (ref 1.010–1.025)
Urobilinogen, UA: 0.2 E.U./dL
pH, UA: 7.5 (ref 5.0–8.0)

## 2019-05-25 MED ORDER — GABAPENTIN 300 MG PO CAPS: ORAL_CAPSULE | ORAL | 3 refills | Status: DC

## 2019-05-25 MED ORDER — LOSARTAN POTASSIUM-HCTZ 100-12.5 MG PO TABS: 1.0000 | ORAL_TABLET | Freq: Every day | ORAL | 3 refills | Status: DC

## 2019-05-25 MED ORDER — GABAPENTIN 300 MG PO CAPS: ORAL_CAPSULE | ORAL | 1 refills | Status: DC

## 2019-05-26 LAB — LIPID PANEL
Chol/HDL Ratio: 2.9 ratio (ref 0.0–5.0)
Cholesterol, Total: 174 mg/dL (ref 100–199)
HDL: 59 mg/dL (ref >39)
LDL Chol Calc (NIH): 96 mg/dL (ref 0–99)
Triglycerides: 108 mg/dL (ref 0–149)
VLDL Cholesterol Cal: 19 mg/dL (ref 5–40)

## 2019-05-26 LAB — COMPREHENSIVE METABOLIC PANEL
ALT: 22 IU/L (ref 0–44)
AST: 22 IU/L (ref 0–40)
Albumin/Globulin Ratio: 1.9 (ref 1.2–2.2)
Albumin: 4.4 g/dL (ref 3.8–4.9)
Alkaline Phosphatase: 71 IU/L (ref 39–117)
BUN/Creatinine Ratio: 18 (ref 9–20)
BUN: 14 mg/dL (ref 6–24)
Bilirubin Total: 0.4 mg/dL (ref 0.0–1.2)
CO2: 26 mmol/L (ref 20–29)
Calcium: 9.5 mg/dL (ref 8.7–10.2)
Chloride: 98 mmol/L (ref 96–106)
Creatinine, Ser: 0.8 mg/dL (ref 0.76–1.27)
GFR calc Af Amer: 114 mL/min/{1.73_m2} (ref >59)
GFR calc non Af Amer: 98 mL/min/{1.73_m2} (ref >59)
Globulin, Total: 2.3 g/dL (ref 1.5–4.5)
Glucose: 95 mg/dL (ref 65–99)
Potassium: 4.4 mmol/L (ref 3.5–5.2)
Sodium: 139 mmol/L (ref 134–144)
Total Protein: 6.7 g/dL (ref 6.0–8.5)

## 2019-05-26 LAB — HEMOGLOBIN A1C
Est. average glucose Bld gHb Est-mCnc: 111 mg/dL
Hgb A1c MFr Bld: 5.5 % (ref 4.8–5.6)

## 2019-05-26 LAB — CBC WITH DIFFERENTIAL/PLATELET
Basophils Absolute: 0.1 10*3/uL (ref 0.0–0.2)
Basos: 1 %
EOS (ABSOLUTE): 0.1 10*3/uL (ref 0.0–0.4)
Eos: 2 %
Hematocrit: 43.3 % (ref 37.5–51.0)
Hemoglobin: 14 g/dL (ref 13.0–17.7)
Immature Grans (Abs): 0 10*3/uL (ref 0.0–0.1)
Immature Granulocytes: 0 %
Lymphocytes Absolute: 2 10*3/uL (ref 0.7–3.1)
Lymphs: 33 %
MCH: 28.3 pg (ref 26.6–33.0)
MCHC: 32.3 g/dL (ref 31.5–35.7)
MCV: 88 fL (ref 79–97)
Monocytes Absolute: 0.5 10*3/uL (ref 0.1–0.9)
Monocytes: 9 %
Neutrophils Absolute: 3.3 10*3/uL (ref 1.4–7.0)
Neutrophils: 55 %
Platelets: 240 10*3/uL (ref 150–450)
RBC: 4.95 x10E6/uL (ref 4.14–5.80)
RDW: 12.9 % (ref 11.6–15.4)
WBC: 6.1 10*3/uL (ref 3.4–10.8)

## 2019-05-26 LAB — TSH: TSH: 1.58 u[IU]/mL (ref 0.450–4.500)

## 2019-05-26 LAB — PSA: Prostate Specific Ag, Serum: <0.1 ng/mL (ref 0.0–4.0)

## 2019-05-29 ENCOUNTER — Telehealth: Payer: Self-pay

## 2019-05-29 NOTE — Telephone Encounter (Signed)
-----   Message from Jerrol Banana., MD sent at 05/26/2019  8:18 AM EDT ----- Labs in normal range.

## 2019-05-29 NOTE — Telephone Encounter (Signed)
Called to advise patient that labs were in normal range.

## 2019-08-25 NOTE — Progress Notes (Signed)
Trena Platt Cummings,acting as a scribe for Wilhemena Durie, MD.,have documented all relevant documentation on the behalf of Wilhemena Durie, MD,as directed by  Wilhemena Durie, MD while in the presence of Wilhemena Durie, MD.  Established patient visit   Patient: Terry Horn.   DOB: 04/12/1960   59 y.o. Male  MRN: [id number] Visit Date: 08/29/2019  Today's healthcare provider: Wilhemena Durie, MD   Chief Complaint  Patient presents with  . Knee Pain   Subjective    Knee Pain  The incident occurred more than 1 week ago. The pain is present in the right knee. The quality of the pain is described as burning. The pain has been fluctuating since onset. Associated symptoms include an inability to bear weight, numbness and tingling. He reports no foreign bodies present. He has tried ice for the symptoms. The treatment provided mild relief.   Went to [facility] 2 weeks ago, had Xrays done and was given voltaren. Patient is supposed to see provider at [facility] again on July 9th. Patient is also wearing knee brace.   Patient also complains of feet being cold and he said he first noticed it a few weeks ago.  He is having symptoms of neuropathy with the tingling and cold feet.      Medications: Outpatient Medications Prior to Visit  Medication Sig  . aspirin 81 MG tablet Take 81 mg by mouth daily.   . Azelastine-Fluticasone (DYMISTA) 137-50 MCG/ACT SUSP Place 2 sprays into the nose daily.  . cetirizine-pseudoephedrine (ZYRTEC-D ALLERGY & CONGESTION) 5-120 MG tablet Take 1 tablet by mouth 2 (two) times daily.   . Cholecalciferol 1000 UNITS tablet Take 1,000 Units by mouth daily.   . finasteride (PROSCAR) 5 MG tablet TAKE ONE TABLET BY MOUTH DAILY  . gabapentin (NEURONTIN) 300 MG capsule TAKE ONE CAPSULE BY MOUTH EVERY MORNING AND TAKE TWO CAPSULES BY MOUTH AT BEDTIME  . losartan-hydrochlorothiazide (HYZAAR) 100-12.5 MG tablet Take 1 tablet by mouth daily.  .  Misc Natural Products (GLUCOSAMINE CHONDROITIN COMPLX) TABS Take 1 tablet by mouth daily.   . Multiple Vitamin tablet Take 1 tablet by mouth daily.   . OMEGA-3 FATTY ACIDS PO Take 1 tablet by mouth daily.   . tamsulosin (FLOMAX) 0.4 MG CAPS capsule TAKE ONE CAPSULE BY MOUTH DAILY  . Turmeric, Curcuma Longa, POWD Take by mouth.   . vitamin C (ASCORBIC ACID) 500 MG tablet Take 500 mg by mouth daily.   . vitamin E 400 UNIT capsule Take 400 Units by mouth daily.   Marland Kitchen amoxicillin-clavulanate (AUGMENTIN) 875-125 MG tablet Take 1 tablet by mouth 2 (two) times daily.  Marland Kitchen oseltamivir (TAMIFLU) 75 MG capsule Take 1 capsule (75 mg total) by mouth 2 (two) times daily. (Patient not taking: Reported on 05/20/2018)  . pravastatin (PRAVACHOL) 20 MG tablet Take 20 mg by mouth daily.   No facility-administered medications prior to visit.    Review of Systems  Constitutional: Negative for appetite change, chills and fever.  Eyes: Negative.   Respiratory: Negative for chest tightness, shortness of breath and wheezing.   Cardiovascular: Negative for chest pain and palpitations.  Gastrointestinal: Negative for abdominal pain, nausea and vomiting.  Musculoskeletal: Positive for arthralgias and back pain.  Neurological: Positive for tingling and numbness.  Psychiatric/Behavioral: Negative.        Objective    BP 125/72 (BP Location: Right Arm, Patient Position: Sitting, Cuff Size: Large)   Pulse 70  Temp (!) 96.9 F (36.1 C) (Temporal)   Ht 5\' 11"  (1.803 m)   Wt (!) 343 lb 3.2 oz (155.7 kg)   BMI 47.87 kg/m  BP Readings from Last 3 Encounters:  08/29/19 125/72  05/25/19 126/77  09/15/18 122/68   Wt Readings from Last 3 Encounters:  08/29/19 (!) 343 lb 3.2 oz (155.7 kg)  05/25/19 (!) 343 lb 12.8 oz (155.9 kg)  09/15/18 (!) 348 lb (157.9 kg)      Physical Exam Vitals reviewed.  Constitutional:      Appearance: He is obese.  HENT:     Head: Normocephalic and atraumatic.  Eyes:     General:  No scleral icterus.    Conjunctiva/sclera: Conjunctivae normal.  Cardiovascular:     Rate and Rhythm: Normal rate and regular rhythm.     Pulses: Normal pulses.     Heart sounds: Normal heart sounds.  Pulmonary:     Effort: Pulmonary effort is normal.     Breath sounds: Normal breath sounds.  Musculoskeletal:     Comments: Mild joint line tenderness.of the knee.  Skin:    General: Skin is warm and dry.  Neurological:     General: No focal deficit present.     Mental Status: He is alert and oriented to person, place, and time.     Comments: Neurosensory exam normal.  Psychiatric:        Mood and Affect: Mood normal.        Behavior: Behavior normal.        Thought Content: Thought content normal.        Judgment: Judgment normal.       No results found for any visits on 08/29/19.  Assessment & Plan     1. Neuropathy Peripheral neuropathy or due to LS spine disease.  Discussed with the patient. - Vitamin B12 - Folate - HgB A1c  2. Cold feet Pulses good.  I do not think he needs vascular exam.  For PVD. - Vitamin B12 - Folate - HgB A1c  3. Essential (primary) hypertension Good control.  4. Obstructive sleep apnea syndrome On CPAP  5. Osteoarthritis of knee (Secondary) (Bilateral) Has Ortho appointment next week  6. Spondylosis without myelopathy or radiculopathy, lumbosacral region   7. Paresthesia   8. Class 3 severe obesity due to excess calories with serious comorbidity and body mass index (BMI) of 45.0 to 49.9 in adult [facility])    No follow-ups on file.      I, Wilhemena Durie, MD, have reviewed all documentation for this visit. The documentation on 09/05/19 for the exam, diagnosis, procedures, and orders are all accurate and complete.    Wise Fees Cranford Mon, MD  [facility] [phone] (phone) [phone] (fax)  [place]

## 2019-08-29 ENCOUNTER — Ambulatory Visit: Payer: BC Managed Care – PPO | Admitting: Family Medicine

## 2019-08-29 ENCOUNTER — Encounter: Payer: Self-pay | Admitting: Family Medicine

## 2019-08-29 ENCOUNTER — Other Ambulatory Visit: Payer: Self-pay

## 2019-08-29 VITALS — BP 125/72 | HR 70 | Temp 96.9°F | Ht 71.0 in | Wt 343.2 lb

## 2019-08-29 DIAGNOSIS — R202 Paresthesia of skin: Secondary | ICD-10-CM

## 2019-08-29 DIAGNOSIS — M47817 Spondylosis without myelopathy or radiculopathy, lumbosacral region: Secondary | ICD-10-CM

## 2019-08-29 DIAGNOSIS — G629 Polyneuropathy, unspecified: Secondary | ICD-10-CM | POA: Diagnosis not present

## 2019-08-29 DIAGNOSIS — G4733 Obstructive sleep apnea (adult) (pediatric): Secondary | ICD-10-CM

## 2019-08-29 DIAGNOSIS — I10 Essential (primary) hypertension: Secondary | ICD-10-CM | POA: Diagnosis not present

## 2019-08-29 DIAGNOSIS — Z6841 Body Mass Index (BMI) 40.0 and over, adult: Secondary | ICD-10-CM

## 2019-08-29 DIAGNOSIS — R209 Unspecified disturbances of skin sensation: Secondary | ICD-10-CM | POA: Diagnosis not present

## 2019-08-29 DIAGNOSIS — M174 Other bilateral secondary osteoarthritis of knee: Secondary | ICD-10-CM

## 2019-08-30 LAB — HEMOGLOBIN A1C
Est. average glucose Bld gHb Est-mCnc: 111 mg/dL
Hgb A1c MFr Bld: 5.5 % (ref 4.8–5.6)

## 2019-08-30 LAB — VITAMIN B12: Vitamin B-12: 699 pg/mL (ref 232–1245)

## 2019-08-30 LAB — FOLATE: Folate: 17.3 ng/mL (ref >3.0)

## 2019-09-05 ENCOUNTER — Telehealth: Payer: Self-pay

## 2019-09-05 NOTE — Telephone Encounter (Signed)
-----   Message from Jerrol Banana., MD sent at 09/02/2019  8:12 AM EDT ----- Labs in normal range.

## 2019-09-05 NOTE — Telephone Encounter (Signed)
Patient advised of lab results through mychart.  °

## 2019-09-16 ENCOUNTER — Other Ambulatory Visit: Payer: Self-pay | Admitting: Family Medicine

## 2019-09-16 DIAGNOSIS — N401 Enlarged prostate with lower urinary tract symptoms: Secondary | ICD-10-CM

## 2019-09-16 NOTE — Telephone Encounter (Signed)
Requested Prescriptions  Pending Prescriptions Disp Refills   tamsulosin (FLOMAX) 0.4 MG CAPS capsule [Pharmacy Med Name: TAMSULOSIN HCL 0.4 MG CAPSULE] 90 capsule 1    Sig: TAKE ONE CAPSULE BY MOUTH DAILY     Urology: Alpha-Adrenergic Blocker Passed - 09/16/2019  6:50 AM      Passed - Last BP in normal range    BP Readings from Last 1 Encounters:  08/29/19 125/72         Passed - Valid encounter within last 12 months    Recent Outpatient Visits          2 weeks ago Neuropathy   [facility] Jerrol Banana., MD   3 months ago Annual physical exam   [facility] Jerrol Banana., MD   1 year ago Essential (primary) hypertension   [facility] Jerrol Banana., MD   1 year ago Influenza [place], Vickki Muff, [place]   1 year ago Annual physical exam   [facility] Jerrol Banana., MD      Future Appointments            In 4 months Jerrol Banana., MD [facility], [place]   In 8 months Jerrol Banana., MD [facility], [place]

## 2019-12-15 ENCOUNTER — Other Ambulatory Visit: Payer: Self-pay | Admitting: Family Medicine

## 2019-12-15 DIAGNOSIS — R351 Nocturia: Secondary | ICD-10-CM

## 2019-12-15 NOTE — Telephone Encounter (Signed)
Requested Prescriptions  Pending Prescriptions Disp Refills  . finasteride (PROSCAR) 5 MG tablet [Pharmacy Med Name: FINASTERIDE 5 MG TABLET] 90 tablet 1    Sig: TAKE ONE TABLET BY MOUTH DAILY     Urology: 5-alpha Reductase Inhibitors Passed - 12/15/2019  1:22 PM      Passed - Valid encounter within last 12 months    Recent Outpatient Visits          3 months ago Neuropathy   [facility] Jerrol Banana., MD   6 months ago Annual physical exam   [facility] Jerrol Banana., MD   1 year ago Essential (primary) hypertension   [facility] Jerrol Banana., MD   1 year ago Influenza [place], Vickki Muff, [place]   1 year ago Annual physical exam   [facility] Jerrol Banana., MD      Future Appointments            In 1 month Jerrol Banana., MD [facility], [place]   In 5 months Jerrol Banana., MD [facility], [place]

## 2019-12-19 ENCOUNTER — Other Ambulatory Visit: Payer: Self-pay | Admitting: Family Medicine

## 2019-12-19 DIAGNOSIS — R351 Nocturia: Secondary | ICD-10-CM

## 2020-01-17 ENCOUNTER — Other Ambulatory Visit: Payer: Self-pay | Admitting: Neurosurgery

## 2020-01-19 ENCOUNTER — Other Ambulatory Visit: Payer: Self-pay | Admitting: Neurosurgery

## 2020-01-24 NOTE — Progress Notes (Signed)
I,April Miller,acting as a scribe for Wilhemena Durie, MD.,have documented all relevant documentation on the behalf of Wilhemena Durie, MD,as directed by  Wilhemena Durie, MD while in the presence of Wilhemena Durie, MD.  Established patient visit   Patient: Terry Horn.   DOB: [birth year]   59 y.o. Male  MRN: [id number] Visit Date: 01/29/2020  Today's healthcare provider: Wilhemena Durie, MD   Chief Complaint  Patient presents with  . Follow-up  . Hypertension   Subjective    HPI  Patient is feeling well and has been doing well with diet with about a 10 pound weight loss. He is having a lot of family issues with parents in failing health. In 2 weeks he is scheduled to have significant back surgery for spinal stenosis with neurogenic claudication. Review of systems is negative for any chest pain or indication of cardiovascular or cerebrovascular disease. Hypertension, follow-up  BP Readings from Last 3 Encounters:  01/29/20 132/74  08/29/19 125/72  05/25/19 126/77   Wt Readings from Last 3 Encounters:  01/29/20 (!) 334 lb (151.5 kg)  08/29/19 (!) 343 lb 3.2 oz (155.7 kg)  05/25/19 (!) 343 lb 12.8 oz (155.9 kg)     He was last seen for hypertension 5 months ago.  BP at that visit was 125/72. Management since that visit includes; Good control. He reports good compliance with treatment. He is not having side effects. none He is exercising. He is adherent to low salt diet.   Outside blood pressures are not checking.  He does not smoke.  Use of agents associated with hypertension: none.   --------------------------------------------------------------------  Neuropathy From 08/29/2019-Peripheral neuropathy or due to LS spine disease.  Discussed with the patient.  Cold feet From 08/29/2019-Pulses good.  I do not think he needs vascular exam.  For PVD.       Medications: Outpatient Medications Prior to Visit  Medication Sig  . aspirin 81  MG tablet Take 81 mg by mouth daily.   . Azelastine-Fluticasone (DYMISTA) 137-50 MCG/ACT SUSP Place 2 sprays into the nose daily.  . cetirizine-pseudoephedrine (ZYRTEC-D ALLERGY & CONGESTION) 5-120 MG tablet Take 1 tablet by mouth 2 (two) times daily.   . Cholecalciferol 1000 UNITS tablet Take 1,000 Units by mouth daily.   . finasteride (PROSCAR) 5 MG tablet TAKE ONE TABLET BY MOUTH DAILY  . gabapentin (NEURONTIN) 300 MG capsule TAKE ONE CAPSULE BY MOUTH EVERY MORNING AND TAKE TWO CAPSULES BY MOUTH AT BEDTIME  . losartan-hydrochlorothiazide (HYZAAR) 100-12.5 MG tablet Take 1 tablet by mouth daily.  . Misc Natural Products (GLUCOSAMINE CHONDROITIN COMPLX) TABS Take 1 tablet by mouth daily.   . Multiple Vitamin tablet Take 1 tablet by mouth daily.   . OMEGA-3 FATTY ACIDS PO Take 1 tablet by mouth daily.   . tamsulosin (FLOMAX) 0.4 MG CAPS capsule TAKE ONE CAPSULE BY MOUTH DAILY  . Turmeric, Curcuma [place]a, POWD Take by mouth.   . vitamin C (ASCORBIC ACID) 500 MG tablet Take 500 mg by mouth daily.   . vitamin E 400 UNIT capsule Take 400 Units by mouth daily.   Marland Kitchen amoxicillin-clavulanate (AUGMENTIN) 875-125 MG tablet Take 1 tablet by mouth 2 (two) times daily.  Marland Kitchen oseltamivir (TAMIFLU) 75 MG capsule Take 1 capsule (75 mg total) by mouth 2 (two) times daily. (Patient not taking: Reported on 05/20/2018)  . pravastatin (PRAVACHOL) 20 MG tablet Take 20 mg by mouth daily.   No facility-administered medications prior to  visit.    Review of Systems  Constitutional: Negative for appetite change, chills and fever.  Respiratory: Negative for chest tightness, shortness of breath and wheezing.   Cardiovascular: Negative for chest pain and palpitations.  Gastrointestinal: Negative for abdominal pain, nausea and vomiting.       Objective    BP 132/74 (BP Location: Right Arm, Patient Position: Sitting, Cuff Size: Large)   Pulse 63   Temp 98.4 F (36.9 C) (Oral)   Resp 16   Ht 5\' 11"  (1.803 m)   Wt (!)  334 lb (151.5 kg)   SpO2 98%   BMI 46.58 kg/m  BP Readings from Last 3 Encounters:  01/29/20 132/74  08/29/19 125/72  05/25/19 126/77   Wt Readings from Last 3 Encounters:  01/29/20 (!) 334 lb (151.5 kg)  08/29/19 (!) 343 lb 3.2 oz (155.7 kg)  05/25/19 (!) 343 lb 12.8 oz (155.9 kg)      Physical Exam Vitals reviewed.  Constitutional:      Appearance: He is well-developed.  HENT:     Head: Normocephalic and atraumatic.     Right Ear: External ear normal.     Left Ear: External ear normal.     Nose: Nose normal.  Eyes:     General: No scleral icterus.    Conjunctiva/sclera: Conjunctivae normal.  Neck:     Thyroid: No thyromegaly.  Cardiovascular:     Rate and Rhythm: Normal rate and regular rhythm.     Heart sounds: Normal heart sounds.  Pulmonary:     Effort: Pulmonary effort is normal.     Breath sounds: Normal breath sounds.  Abdominal:     Palpations: Abdomen is soft.  Skin:    General: Skin is warm and dry.  Neurological:     Mental Status: He is alert and oriented to person, place, and time. Mental status is at baseline.  Psychiatric:        Mood and Affect: Mood normal.        Behavior: Behavior normal.        Thought Content: Thought content normal.        Judgment: Judgment normal.       No results found for any visits on 01/29/20.  Assessment & Plan     1. Spinal stenosis of lumbar region with neurogenic claudication Medically cleared for surgery on 02/14/2020  2. Essential (primary) hypertension Controlled  3. Obstructive sleep apnea syndrome Wears CPAP  4. Class 3 severe obesity due to excess calories with serious comorbidity and body mass index (BMI) of 45.0 to 49.9 in adult [facility]) As above problems.  Diet and exercise has been stressed and patient is working on it.   No follow-ups on file.      I, Wilhemena Durie, MD, have reviewed all documentation for this visit. The documentation on 02/01/20 for the exam, diagnosis, procedures,  and orders are all accurate and complete.    Emma Birchler Cranford Mon, MD  [facility] [phone] (phone) [phone] (fax)  [place]

## 2020-01-29 ENCOUNTER — Other Ambulatory Visit: Payer: Self-pay

## 2020-01-29 ENCOUNTER — Ambulatory Visit: Payer: BC Managed Care – PPO | Admitting: Family Medicine

## 2020-01-29 ENCOUNTER — Encounter: Payer: Self-pay | Admitting: Family Medicine

## 2020-01-29 VITALS — BP 132/74 | HR 63 | Temp 98.4°F | Resp 16 | Ht 71.0 in | Wt 334.0 lb

## 2020-01-29 DIAGNOSIS — I10 Essential (primary) hypertension: Secondary | ICD-10-CM

## 2020-01-29 DIAGNOSIS — G4733 Obstructive sleep apnea (adult) (pediatric): Secondary | ICD-10-CM

## 2020-01-29 DIAGNOSIS — Z6841 Body Mass Index (BMI) 40.0 and over, adult: Secondary | ICD-10-CM

## 2020-01-29 DIAGNOSIS — M48062 Spinal stenosis, lumbar region with neurogenic claudication: Secondary | ICD-10-CM | POA: Diagnosis not present

## 2020-02-03 ENCOUNTER — Encounter: Payer: Self-pay | Admitting: Family Medicine

## 2020-02-06 NOTE — Progress Notes (Signed)
Terry Horn called to see if he should take the Probiotic, someone told me when to stop the other medications. I told the patient 1 week.  "The other girl said to stop my medications on 02/09/20, why should I stop the medications?"  I asked if patient was seen in PAT,  Mr. Boyadjian said "no, I guess it was the girl in the office." I said to stop Probiotics the same time he stops the others, to follow Dr's instructions.

## 2020-02-07 ENCOUNTER — Encounter (HOSPITAL_COMMUNITY)
Admission: RE | Admit: 2020-02-07 | Discharge: 2020-02-07 | Disposition: A | Discharge status: Home / Self Care | Payer: BC Managed Care – PPO | Source: Ambulatory Visit | Attending: Neurosurgery | Admitting: Neurosurgery

## 2020-02-07 ENCOUNTER — Other Ambulatory Visit: Payer: Self-pay

## 2020-02-07 ENCOUNTER — Encounter (HOSPITAL_COMMUNITY): Payer: Self-pay

## 2020-02-07 DIAGNOSIS — Z01812 Encounter for preprocedural laboratory examination: Secondary | ICD-10-CM | POA: Diagnosis not present

## 2020-02-07 LAB — SURGICAL PCR SCREEN
MRSA, PCR: NEGATIVE
Staphylococcus aureus: NEGATIVE

## 2020-02-07 LAB — CBC
HCT: 43.8 % (ref 39.0–52.0)
Hemoglobin: 14.3 g/dL (ref 13.0–17.0)
MCH: 29 pg (ref 26.0–34.0)
MCHC: 32.6 g/dL (ref 30.0–36.0)
MCV: 88.8 fL (ref 80.0–100.0)
Platelets: 247 10*3/uL (ref 150–400)
RBC: 4.93 MIL/uL (ref 4.22–5.81)
RDW: 12.5 % (ref 11.5–15.5)
WBC: 7.3 10*3/uL (ref 4.0–10.5)
nRBC: 0 % (ref 0.0–0.2)

## 2020-02-07 LAB — BASIC METABOLIC PANEL
Anion gap: 10 (ref 5–15)
BUN: 12 mg/dL (ref 6–20)
CO2: 26 mmol/L (ref 22–32)
Calcium: 9.1 mg/dL (ref 8.9–10.3)
Chloride: 102 mmol/L (ref 98–111)
Creatinine, Ser: 0.71 mg/dL (ref 0.61–1.24)
GFR, Estimated: >60 mL/min (ref >60)
Glucose, Bld: 108 mg/dL — ABNORMAL HIGH (ref 70–99)
Potassium: 3.8 mmol/L (ref 3.5–5.1)
Sodium: 138 mmol/L (ref 135–145)

## 2020-02-07 LAB — TYPE AND SCREEN
ABO/RH(D): A POS
Antibody Screen: NEGATIVE

## 2020-02-07 NOTE — Pre-Procedure Instructions (Signed)
Terry Horn.  02/07/2020      [place], [place] [address] Phone: [phone] Fax: [phone]    Your procedure is scheduled on Dec. 15  Report to [facility] entrance A at 6:30  A.M.  Call this number if you have problems the morning of surgery:  [phone]   Remember:  Do not eat or drink after midnight.      Take these medicines the morning of surgery with A SIP OF WATER :              Zyrtec             Gabapentin (neurontin)                            If needed: eye drops         7 days prior to surgery STOP taking any Aspirin (unless otherwise instructed by your surgeon), Aleve, Naproxen, Ibuprofen, Motrin, Advil, Goody's, BC's, all herbal medications, fish oil, and all vitamins and diclofenac (voltaren)        Follow your surgeon's instructions on when to stop Aspirin.  If no instructions were given by your surgeon then you will need to call the office to get those instructions.      Do not wear jewelry.  Do not wear lotions, powders, or perfumes, or deodorant.  Do not shave 48 hours prior to surgery.  Men may shave face and neck.  Do not bring valuables to the hospital.  [facility] is not responsible for any belongings or valuables.  Contacts, dentures or bridgework may not be worn into surgery.  Leave your suitcase in the car.  After surgery it may be brought to your room.  For patients admitted to the hospital, discharge time will be determined by your treatment team.  Patients discharged the day of surgery will not be allowed to drive home.    Special instructions:   [facility]- Preparing For Surgery  Before surgery, you can play an important role. Because skin is not sterile, your skin needs to be as free of germs as possible. You can reduce the number of germs on your skin by washing with CHG (chlorahexidine gluconate) Soap before surgery.  CHG is an  antiseptic cleaner which kills germs and bonds with the skin to continue killing germs even after washing.    Oral Hygiene is also important to reduce your risk of infection.  Remember - BRUSH YOUR TEETH THE MORNING OF SURGERY WITH YOUR REGULAR TOOTHPASTE  Please do not use if you have an allergy to CHG or antibacterial soaps. If your skin becomes reddened/irritated stop using the CHG.  Do not shave (including legs and underarms) for at least 48 hours prior to first CHG shower. It is OK to shave your face.  Please follow these instructions carefully.   1. Shower the NIGHT BEFORE SURGERY and the MORNING OF SURGERY with CHG.   2. If you chose to wash your hair, wash your hair first as usual with your normal shampoo.  3. After you shampoo, rinse your hair and body thoroughly to remove the shampoo.  4. Use CHG as you would any other liquid soap. You can apply CHG directly to the skin and wash gently with a scrungie or a clean washcloth.   5. Apply the CHG Soap to your body ONLY  FROM THE NECK DOWN.  Do not use on open wounds or open sores. Avoid contact with your eyes, ears, mouth and genitals (private parts). Wash Face and genitals (private parts)  with your normal soap.  6. Wash thoroughly, paying special attention to the area where your surgery will be performed.  7. Thoroughly rinse your body with warm water from the neck down.  8. DO NOT shower/wash with your normal soap after using and rinsing off the CHG Soap.  9. Pat yourself dry with a CLEAN TOWEL.  10. Wear CLEAN PAJAMAS to bed the night before surgery, wear comfortable clothes the morning of surgery  11. Place CLEAN SHEETS on your bed the night of your first shower and DO NOT SLEEP WITH PETS.    Day of Surgery:  Do not apply any deodorants/lotions.  Please wear clean clothes to the hospital/surgery center.   Remember to brush your teeth WITH YOUR REGULAR TOOTHPASTE.    Please read over the following fact sheets that you  were given.

## 2020-02-07 NOTE — Progress Notes (Signed)
PCP - Miguel Aschoff @  [facility] Cardiologist - Kowals[place]   Chest x-ray -  na EKG - 05/25/19 Stress Test - 08/2015 ECHO - 08/2015 Cardiac Cath -   na  Sleep Study - 05/2015 CPAP - yes    Blood Thinner Instructions: na Aspirin Instructions: last dose 12/10  COVID TEST-  02/12/20 @ [place]   Anesthesia review:   Patient denies shortness of breath, fever, cough and chest pain at PAT appointment   All instructions explained to the patient, with a verbal understanding of the material. Patient agrees to go over the instructions while at home for a better understanding. Patient also instructed to self quarantine after being tested for COVID-19. The opportunity to ask questions was provided.  Pt. Requesting wife be accompanied with son the day of surgery since she has a mobility issue. Will send Anette Riedel a message to approve his request.

## 2020-02-08 ENCOUNTER — Other Ambulatory Visit: Payer: Self-pay | Admitting: Family Medicine

## 2020-02-08 DIAGNOSIS — F5104 Psychophysiologic insomnia: Secondary | ICD-10-CM

## 2020-02-08 MED ORDER — ZOLPIDEM TARTRATE 10 MG PO TABS: 10.0000 mg | ORAL_TABLET | Freq: Every evening | ORAL | 1 refills | Status: DC | PRN

## 2020-02-09 ENCOUNTER — Encounter: Payer: Self-pay | Admitting: Family Medicine

## 2020-02-09 ENCOUNTER — Encounter: Payer: Self-pay | Admitting: Dermatology

## 2020-02-12 ENCOUNTER — Other Ambulatory Visit: Payer: Self-pay

## 2020-02-12 ENCOUNTER — Other Ambulatory Visit
Admission: RE | Admit: 2020-02-12 | Discharge: 2020-02-12 | Disposition: A | Discharge status: Home / Self Care | Payer: BC Managed Care – PPO | Source: Ambulatory Visit | Attending: Neurosurgery | Admitting: Neurosurgery

## 2020-02-12 DIAGNOSIS — Z01812 Encounter for preprocedural laboratory examination: Secondary | ICD-10-CM | POA: Diagnosis present

## 2020-02-12 DIAGNOSIS — Z20822 Contact with and (suspected) exposure to covid-19: Secondary | ICD-10-CM | POA: Insufficient documentation

## 2020-02-13 LAB — SARS CORONAVIRUS 2 (TAT 6-24 HRS): SARS Coronavirus 2: NEGATIVE

## 2020-02-13 MED ORDER — VANCOMYCIN HCL 1500 MG/300ML IV SOLN
1500.0000 mg | INTRAVENOUS | Status: AC
  Administered 2020-02-14: 07:00:00 1500 mg via INTRAVENOUS
  Filled 2020-02-13: qty 300

## 2020-02-14 ENCOUNTER — Other Ambulatory Visit: Payer: Self-pay

## 2020-02-14 ENCOUNTER — Inpatient Hospital Stay (HOSPITAL_COMMUNITY): Payer: BC Managed Care – PPO

## 2020-02-14 ENCOUNTER — Encounter (HOSPITAL_COMMUNITY)
Admission: RE | Disposition: A | Discharge status: Home / Self Care | Payer: Self-pay | Source: Home / Self Care | Attending: Neurosurgery

## 2020-02-14 ENCOUNTER — Inpatient Hospital Stay (HOSPITAL_COMMUNITY): Payer: BC Managed Care – PPO | Admitting: Anesthesiology

## 2020-02-14 ENCOUNTER — Encounter (HOSPITAL_COMMUNITY): Payer: Self-pay | Admitting: Neurosurgery

## 2020-02-14 ENCOUNTER — Inpatient Hospital Stay (HOSPITAL_COMMUNITY)
Admission: RE | Admit: 2020-02-14 | Discharge: 2020-02-16 | DRG: 454 | Disposition: A | Discharge status: Home / Self Care | Payer: BC Managed Care – PPO | Attending: Neurosurgery | Admitting: Neurosurgery

## 2020-02-14 DIAGNOSIS — Z79899 Other long term (current) drug therapy: Secondary | ICD-10-CM | POA: Diagnosis not present

## 2020-02-14 DIAGNOSIS — Z6841 Body Mass Index (BMI) 40.0 and over, adult: Secondary | ICD-10-CM

## 2020-02-14 DIAGNOSIS — E669 Obesity, unspecified: Secondary | ICD-10-CM | POA: Diagnosis present

## 2020-02-14 DIAGNOSIS — Z8701 Personal history of pneumonia (recurrent): Secondary | ICD-10-CM | POA: Diagnosis not present

## 2020-02-14 DIAGNOSIS — M19029 Primary osteoarthritis, unspecified elbow: Secondary | ICD-10-CM | POA: Diagnosis present

## 2020-02-14 DIAGNOSIS — I10 Essential (primary) hypertension: Secondary | ICD-10-CM | POA: Diagnosis present

## 2020-02-14 DIAGNOSIS — Z85828 Personal history of other malignant neoplasm of skin: Secondary | ICD-10-CM | POA: Diagnosis not present

## 2020-02-14 DIAGNOSIS — G4730 Sleep apnea, unspecified: Secondary | ICD-10-CM | POA: Diagnosis present

## 2020-02-14 DIAGNOSIS — Z87891 Personal history of nicotine dependence: Secondary | ICD-10-CM | POA: Diagnosis not present

## 2020-02-14 DIAGNOSIS — Z419 Encounter for procedure for purposes other than remedying health state, unspecified: Secondary | ICD-10-CM

## 2020-02-14 DIAGNOSIS — Z7982 Long term (current) use of aspirin: Secondary | ICD-10-CM | POA: Diagnosis not present

## 2020-02-14 DIAGNOSIS — M5116 Intervertebral disc disorders with radiculopathy, lumbar region: Secondary | ICD-10-CM | POA: Diagnosis present

## 2020-02-14 DIAGNOSIS — K219 Gastro-esophageal reflux disease without esophagitis: Secondary | ICD-10-CM | POA: Diagnosis present

## 2020-02-14 DIAGNOSIS — M48062 Spinal stenosis, lumbar region with neurogenic claudication: Secondary | ICD-10-CM | POA: Diagnosis present

## 2020-02-14 DIAGNOSIS — M4316 Spondylolisthesis, lumbar region: Principal | ICD-10-CM | POA: Diagnosis present

## 2020-02-14 DIAGNOSIS — M19042 Primary osteoarthritis, left hand: Secondary | ICD-10-CM | POA: Diagnosis present

## 2020-02-14 DIAGNOSIS — M19041 Primary osteoarthritis, right hand: Secondary | ICD-10-CM | POA: Diagnosis present

## 2020-02-14 LAB — ABO/RH: ABO/RH(D): A POS

## 2020-02-14 SURGERY — POSTERIOR LUMBAR FUSION 1 LEVEL
Anesthesia: General | Site: Spine Lumbar

## 2020-02-14 MED ORDER — OXYCODONE HCL 5 MG PO TABS
ORAL_TABLET | ORAL | Status: AC
  Filled 2020-02-14: qty 1

## 2020-02-14 MED ORDER — ONDANSETRON HCL 4 MG/2ML IJ SOLN: 4.0000 mg | Freq: Four times a day (QID) | INTRAMUSCULAR | Status: DC | PRN

## 2020-02-14 MED ORDER — LOSARTAN POTASSIUM-HCTZ 100-12.5 MG PO TABS: 1.0000 | ORAL_TABLET | Freq: Every day | ORAL | Status: DC

## 2020-02-14 MED ORDER — LIDOCAINE 2% (20 MG/ML) 5 ML SYRINGE
INTRAMUSCULAR | Status: DC | PRN
  Administered 2020-02-14: 40 mg via INTRAVENOUS

## 2020-02-14 MED ORDER — PRAVASTATIN SODIUM 10 MG PO TABS
20.0000 mg | ORAL_TABLET | Freq: Every day | ORAL | Status: DC
  Administered 2020-02-14 – 2020-02-15 (×2): 20 mg via ORAL
  Filled 2020-02-14 (×2): qty 2

## 2020-02-14 MED ORDER — FLUTICASONE PROPIONATE 50 MCG/ACT NA SUSP: 2.0000 | Freq: Every day | NASAL | Status: DC | PRN

## 2020-02-14 MED ORDER — DEXMEDETOMIDINE (PRECEDEX) IN NS 20 MCG/5ML (4 MCG/ML) IV SYRINGE
PREFILLED_SYRINGE | INTRAVENOUS | Status: DC | PRN
  Administered 2020-02-14: 8 ug via INTRAVENOUS
  Administered 2020-02-14: 16 ug via INTRAVENOUS

## 2020-02-14 MED ORDER — HEMOSTATIC AGENTS (NO CHARGE) OPTIME
TOPICAL | Status: DC | PRN
Start: 2020-02-14 — End: 2020-02-14
  Administered 2020-02-14: 1 via TOPICAL

## 2020-02-14 MED ORDER — MIDAZOLAM HCL 5 MG/5ML IJ SOLN
INTRAMUSCULAR | Status: DC | PRN
  Administered 2020-02-14: 2 mg via INTRAVENOUS

## 2020-02-14 MED ORDER — TAMSULOSIN HCL 0.4 MG PO CAPS
0.4000 mg | ORAL_CAPSULE | Freq: Every day | ORAL | Status: DC
  Administered 2020-02-14 – 2020-02-15 (×2): 0.4 mg via ORAL
  Filled 2020-02-14 (×2): qty 1

## 2020-02-14 MED ORDER — MENTHOL 3 MG MT LOZG: 1.0000 | LOZENGE | OROMUCOSAL | Status: DC | PRN

## 2020-02-14 MED ORDER — SODIUM CHLORIDE 0.9% FLUSH: 3.0000 mL | INTRAVENOUS | Status: DC | PRN

## 2020-02-14 MED ORDER — GABAPENTIN 300 MG PO CAPS: 300.0000 mg | ORAL_CAPSULE | ORAL | Status: DC

## 2020-02-14 MED ORDER — DEXAMETHASONE SODIUM PHOSPHATE 10 MG/ML IJ SOLN
INTRAMUSCULAR | Status: DC | PRN
  Administered 2020-02-14: 10 mg via INTRAVENOUS

## 2020-02-14 MED ORDER — DOCUSATE SODIUM 100 MG PO CAPS
100.0000 mg | ORAL_CAPSULE | Freq: Two times a day (BID) | ORAL | Status: DC
  Administered 2020-02-14 – 2020-02-15 (×3): 100 mg via ORAL
  Filled 2020-02-14 (×3): qty 1

## 2020-02-14 MED ORDER — MORPHINE SULFATE (PF) 4 MG/ML IV SOLN: 4.0000 mg | INTRAVENOUS | Status: DC | PRN

## 2020-02-14 MED ORDER — SUGAMMADEX SODIUM 200 MG/2ML IV SOLN
INTRAVENOUS | Status: DC | PRN
  Administered 2020-02-14: 400 mg via INTRAVENOUS

## 2020-02-14 MED ORDER — AZELASTINE HCL 0.1 % NA SOLN: 2.0000 | Freq: Every day | NASAL | Status: DC | PRN

## 2020-02-14 MED ORDER — ONDANSETRON HCL 4 MG PO TABS: 4.0000 mg | ORAL_TABLET | Freq: Four times a day (QID) | ORAL | Status: DC | PRN

## 2020-02-14 MED ORDER — VANCOMYCIN HCL 1000 MG IV SOLR
INTRAVENOUS | Status: DC | PRN
  Administered 2020-02-14: 09:00:00 1500 mg via INTRAVENOUS

## 2020-02-14 MED ORDER — CYCLOBENZAPRINE HCL 10 MG PO TABS
10.0000 mg | ORAL_TABLET | Freq: Three times a day (TID) | ORAL | Status: DC | PRN
  Administered 2020-02-14 – 2020-02-16 (×4): 10 mg via ORAL
  Filled 2020-02-14 (×4): qty 1

## 2020-02-14 MED ORDER — ONDANSETRON HCL 4 MG/2ML IJ SOLN: 4.0000 mg | Freq: Once | INTRAMUSCULAR | Status: DC | PRN

## 2020-02-14 MED ORDER — BACITRACIN ZINC 500 UNIT/GM EX OINT
TOPICAL_OINTMENT | CUTANEOUS | Status: AC
  Filled 2020-02-14: qty 28.35

## 2020-02-14 MED ORDER — ROCURONIUM BROMIDE 10 MG/ML (PF) SYRINGE
PREFILLED_SYRINGE | INTRAVENOUS | Status: DC | PRN
  Administered 2020-02-14: 60 mg via INTRAVENOUS
  Administered 2020-02-14 (×2): 20 mg via INTRAVENOUS

## 2020-02-14 MED ORDER — CHLORHEXIDINE GLUCONATE CLOTH 2 % EX PADS: 6.0000 | MEDICATED_PAD | Freq: Once | CUTANEOUS | Status: DC

## 2020-02-14 MED ORDER — AZELASTINE-FLUTICASONE 137-50 MCG/ACT NA SUSP: 2.0000 | Freq: Every day | NASAL | Status: DC | PRN

## 2020-02-14 MED ORDER — CYCLOBENZAPRINE HCL 10 MG PO TABS
ORAL_TABLET | ORAL | Status: AC
  Filled 2020-02-14: qty 1

## 2020-02-14 MED ORDER — OXYCODONE HCL 5 MG PO TABS
10.0000 mg | ORAL_TABLET | ORAL | Status: DC | PRN
Start: 2020-02-14 — End: 2020-02-16
  Administered 2020-02-14 – 2020-02-16 (×12): 10 mg via ORAL
  Filled 2020-02-14 (×12): qty 2

## 2020-02-14 MED ORDER — ACETAMINOPHEN 500 MG PO TABS
1000.0000 mg | ORAL_TABLET | Freq: Four times a day (QID) | ORAL | Status: AC
  Administered 2020-02-14 – 2020-02-15 (×4): 1000 mg via ORAL
  Filled 2020-02-14 (×4): qty 2

## 2020-02-14 MED ORDER — FENTANYL CITRATE (PF) 100 MCG/2ML IJ SOLN
25.0000 ug | INTRAMUSCULAR | Status: DC | PRN
  Administered 2020-02-14: 50 ug via INTRAVENOUS

## 2020-02-14 MED ORDER — BISACODYL 10 MG RE SUPP: 10.0000 mg | Freq: Every day | RECTAL | Status: DC | PRN

## 2020-02-14 MED ORDER — LORATADINE 10 MG PO TABS
10.0000 mg | ORAL_TABLET | Freq: Every day | ORAL | Status: DC
  Administered 2020-02-14 – 2020-02-15 (×2): 10 mg via ORAL
  Filled 2020-02-14 (×2): qty 1

## 2020-02-14 MED ORDER — MIDAZOLAM HCL 2 MG/2ML IJ SOLN
INTRAMUSCULAR | Status: AC
  Filled 2020-02-14: qty 2

## 2020-02-14 MED ORDER — GABAPENTIN 300 MG PO CAPS
300.0000 mg | ORAL_CAPSULE | Freq: Every morning | ORAL | Status: DC
  Administered 2020-02-15: 10:00:00 300 mg via ORAL
  Filled 2020-02-14: qty 1

## 2020-02-14 MED ORDER — SODIUM CHLORIDE 0.9% FLUSH
3.0000 mL | Freq: Two times a day (BID) | INTRAVENOUS | Status: DC
  Administered 2020-02-14: 22:00:00 3 mL via INTRAVENOUS

## 2020-02-14 MED ORDER — PHENYLEPHRINE HCL-NACL 10-0.9 MG/250ML-% IV SOLN
INTRAVENOUS | Status: DC | PRN
  Administered 2020-02-14: 30 ug/min via INTRAVENOUS

## 2020-02-14 MED ORDER — BUPIVACAINE-EPINEPHRINE (PF) 0.5% -1:200000 IJ SOLN
INTRAMUSCULAR | Status: DC | PRN
  Administered 2020-02-14: 10 mL

## 2020-02-14 MED ORDER — PROPOFOL 10 MG/ML IV BOLUS
INTRAVENOUS | Status: DC | PRN
  Administered 2020-02-14: 180 mg via INTRAVENOUS

## 2020-02-14 MED ORDER — ACETAMINOPHEN 10 MG/ML IV SOLN
INTRAVENOUS | Status: AC
  Filled 2020-02-14: qty 100

## 2020-02-14 MED ORDER — FENTANYL CITRATE (PF) 250 MCG/5ML IJ SOLN
INTRAMUSCULAR | Status: DC | PRN
  Administered 2020-02-14: 100 ug via INTRAVENOUS
  Administered 2020-02-14 (×3): 50 ug via INTRAVENOUS

## 2020-02-14 MED ORDER — OXYCODONE HCL 5 MG/5ML PO SOLN: 5.0000 mg | Freq: Once | ORAL | Status: AC | PRN

## 2020-02-14 MED ORDER — BUPIVACAINE-EPINEPHRINE 0.5% -1:200000 IJ SOLN
INTRAMUSCULAR | Status: AC
  Filled 2020-02-14: qty 1

## 2020-02-14 MED ORDER — BUPIVACAINE LIPOSOME 1.3 % IJ SUSP
20.0000 mL | INTRAMUSCULAR | Status: AC
  Administered 2020-02-14: 13:00:00 20 mL
  Filled 2020-02-14: qty 20

## 2020-02-14 MED ORDER — SODIUM CHLORIDE 0.9 % IV SOLN: 250.0000 mL | INTRAVENOUS | Status: DC

## 2020-02-14 MED ORDER — EPHEDRINE SULFATE-NACL 50-0.9 MG/10ML-% IV SOSY
PREFILLED_SYRINGE | INTRAVENOUS | Status: DC | PRN
  Administered 2020-02-14: 10 mg via INTRAVENOUS

## 2020-02-14 MED ORDER — ALBUMIN HUMAN 5 % IV SOLN: INTRAVENOUS | Status: DC | PRN

## 2020-02-14 MED ORDER — FINASTERIDE 5 MG PO TABS
5.0000 mg | ORAL_TABLET | Freq: Every day | ORAL | Status: DC
Start: 2020-02-14 — End: 2020-02-16
  Administered 2020-02-14 – 2020-02-15 (×2): 5 mg via ORAL
  Filled 2020-02-14 (×2): qty 1

## 2020-02-14 MED ORDER — ACETAMINOPHEN 10 MG/ML IV SOLN
INTRAVENOUS | Status: DC | PRN
  Administered 2020-02-14: 1000 mg via INTRAVENOUS

## 2020-02-14 MED ORDER — 0.9 % SODIUM CHLORIDE (POUR BTL) OPTIME
TOPICAL | Status: DC | PRN
  Administered 2020-02-14: 10:00:00 1000 mL

## 2020-02-14 MED ORDER — FENTANYL CITRATE (PF) 250 MCG/5ML IJ SOLN
INTRAMUSCULAR | Status: AC
  Filled 2020-02-14: qty 5

## 2020-02-14 MED ORDER — PROPOFOL 10 MG/ML IV BOLUS
INTRAVENOUS | Status: AC
  Filled 2020-02-14: qty 40

## 2020-02-14 MED ORDER — ACETAMINOPHEN 650 MG RE SUPP: 650.0000 mg | RECTAL | Status: DC | PRN

## 2020-02-14 MED ORDER — ORAL CARE MOUTH RINSE: 15.0000 mL | Freq: Once | OROMUCOSAL | Status: AC

## 2020-02-14 MED ORDER — ACETAMINOPHEN 325 MG PO TABS: 650.0000 mg | ORAL_TABLET | ORAL | Status: DC | PRN

## 2020-02-14 MED ORDER — OXYCODONE HCL 5 MG PO TABS
5.0000 mg | ORAL_TABLET | ORAL | Status: DC | PRN
  Filled 2020-02-14 (×2): qty 1

## 2020-02-14 MED ORDER — VANCOMYCIN HCL 1250 MG/250ML IV SOLN
1250.0000 mg | Freq: Once | INTRAVENOUS | Status: AC
  Administered 2020-02-14: 18:00:00 1250 mg via INTRAVENOUS
  Filled 2020-02-14: qty 250

## 2020-02-14 MED ORDER — ONDANSETRON HCL 4 MG/2ML IJ SOLN
INTRAMUSCULAR | Status: DC | PRN
  Administered 2020-02-14: 4 mg via INTRAVENOUS

## 2020-02-14 MED ORDER — THROMBIN 5000 UNITS EX SOLR
CUTANEOUS | Status: AC
  Filled 2020-02-14: qty 5000

## 2020-02-14 MED ORDER — GABAPENTIN 300 MG PO CAPS
600.0000 mg | ORAL_CAPSULE | Freq: Every day | ORAL | Status: DC
  Administered 2020-02-14 – 2020-02-15 (×2): 600 mg via ORAL
  Filled 2020-02-14 (×2): qty 2

## 2020-02-14 MED ORDER — POLYVINYL ALCOHOL 1.4 % OP SOLN: 1.0000 [drp] | Freq: Every day | OPHTHALMIC | Status: DC | PRN

## 2020-02-14 MED ORDER — LOSARTAN POTASSIUM 50 MG PO TABS
100.0000 mg | ORAL_TABLET | Freq: Every day | ORAL | Status: DC
  Administered 2020-02-15: 11:00:00 100 mg via ORAL
  Filled 2020-02-14: qty 2

## 2020-02-14 MED ORDER — BACITRACIN ZINC 500 UNIT/GM EX OINT
TOPICAL_OINTMENT | CUTANEOUS | Status: DC | PRN
  Administered 2020-02-14: 1 via TOPICAL

## 2020-02-14 MED ORDER — PHENOL 1.4 % MT LIQD: 1.0000 | OROMUCOSAL | Status: DC | PRN

## 2020-02-14 MED ORDER — OXYCODONE HCL 5 MG PO TABS
5.0000 mg | ORAL_TABLET | Freq: Once | ORAL | Status: AC | PRN
  Administered 2020-02-14: 5 mg via ORAL

## 2020-02-14 MED ORDER — CHLORHEXIDINE GLUCONATE 0.12 % MT SOLN
15.0000 mL | Freq: Once | OROMUCOSAL | Status: AC
  Administered 2020-02-14: 07:00:00 15 mL via OROMUCOSAL
  Filled 2020-02-14: qty 15

## 2020-02-14 MED ORDER — HYDROCHLOROTHIAZIDE 12.5 MG PO CAPS
12.5000 mg | ORAL_CAPSULE | Freq: Every day | ORAL | Status: DC
  Administered 2020-02-15: 11:00:00 12.5 mg via ORAL
  Filled 2020-02-14: qty 1

## 2020-02-14 MED ORDER — FENTANYL CITRATE (PF) 100 MCG/2ML IJ SOLN
INTRAMUSCULAR | Status: AC
  Filled 2020-02-14: qty 2

## 2020-02-14 MED ORDER — LACTATED RINGERS IV SOLN: INTRAVENOUS | Status: DC

## 2020-02-14 SURGICAL SUPPLY — 65 items
BENZOIN TINCTURE PRP APPL 2/3 (GAUZE/BANDAGES/DRESSINGS) ×2 IMPLANT
BLADE CLIPPER SURG (BLADE) IMPLANT
BUR MATCHSTICK NEURO 3.0 LAGG (BURR) ×2 IMPLANT
BUR PRECISION FLUTE 6.0 (BURR) ×2 IMPLANT
CAGE ALTERA 10X31X10-14 15D (Cage) ×2 IMPLANT
CANISTER SUCT 3000ML PPV (MISCELLANEOUS) ×2 IMPLANT
CAP LOCK DLX THRD (Cap) ×8 IMPLANT
CARTRIDGE OIL MAESTRO DRILL (MISCELLANEOUS) ×1 IMPLANT
CNTNR URN SCR LID CUP LEK RST (MISCELLANEOUS) ×1 IMPLANT
CONT SPEC 4OZ STRL OR WHT (MISCELLANEOUS) ×1
COVER BACK TABLE 60X90IN (DRAPES) ×2 IMPLANT
COVER WAND RF STERILE (DRAPES) IMPLANT
DECANTER SPIKE VIAL GLASS SM (MISCELLANEOUS) ×2 IMPLANT
DIFFUSER DRILL AIR PNEUMATIC (MISCELLANEOUS) ×2 IMPLANT
DRAPE C-ARM 42X72 X-RAY (DRAPES) ×4 IMPLANT
DRAPE HALF SHEET 40X57 (DRAPES) ×4 IMPLANT
DRAPE LAPAROTOMY 100X72X124 (DRAPES) ×2 IMPLANT
DRAPE SURG 17X23 STRL (DRAPES) ×8 IMPLANT
DRSG OPSITE POSTOP 4X8 (GAUZE/BANDAGES/DRESSINGS) ×2 IMPLANT
ELECT BLADE 4.0 EZ CLEAN MEGAD (MISCELLANEOUS) ×2
ELECT REM PT RETURN 9FT ADLT (ELECTROSURGICAL) ×2
ELECTRODE BLDE 4.0 EZ CLN MEGD (MISCELLANEOUS) ×1 IMPLANT
ELECTRODE REM PT RTRN 9FT ADLT (ELECTROSURGICAL) ×1 IMPLANT
EVACUATOR 1/8 PVC DRAIN (DRAIN) IMPLANT
GAUZE 4X4 16PLY RFD (DISPOSABLE) ×2 IMPLANT
GLOVE BIO SURGEON STRL SZ 6.5 (GLOVE) ×2 IMPLANT
GLOVE BIO SURGEON STRL SZ8 (GLOVE) ×4 IMPLANT
GLOVE BIO SURGEON STRL SZ8.5 (GLOVE) ×4 IMPLANT
GLOVE EXAM NITRILE XL STR (GLOVE) IMPLANT
GLOVE SURG SS PI 6.0 STRL IVOR (GLOVE) ×10 IMPLANT
GLOVE SURG SS PI 6.5 STRL IVOR (GLOVE) ×2 IMPLANT
GLOVE SURG UNDER POLY LF SZ6.5 (GLOVE) ×4 IMPLANT
GOWN STRL REUS W/ TWL LRG LVL3 (GOWN DISPOSABLE) ×2 IMPLANT
GOWN STRL REUS W/ TWL XL LVL3 (GOWN DISPOSABLE) ×2 IMPLANT
GOWN STRL REUS W/TWL 2XL LVL3 (GOWN DISPOSABLE) IMPLANT
GOWN STRL REUS W/TWL LRG LVL3 (GOWN DISPOSABLE) ×2
GOWN STRL REUS W/TWL XL LVL3 (GOWN DISPOSABLE) ×2
HEMOSTAT POWDER KIT SURGIFOAM (HEMOSTASIS) IMPLANT
KIT BASIN OR (CUSTOM PROCEDURE TRAY) ×2 IMPLANT
KIT TURNOVER KIT B (KITS) ×2 IMPLANT
MILL MEDIUM DISP (BLADE) ×2 IMPLANT
NEEDLE HYPO 21X1.5 SAFETY (NEEDLE) ×2 IMPLANT
NEEDLE HYPO 22GX1.5 SAFETY (NEEDLE) ×2 IMPLANT
NS IRRIG 1000ML POUR BTL (IV SOLUTION) ×2 IMPLANT
OIL CARTRIDGE MAESTRO DRILL (MISCELLANEOUS) ×2
PACK LAMINECTOMY NEURO (CUSTOM PROCEDURE TRAY) ×2 IMPLANT
PAD ARMBOARD 7.5X6 YLW CONV (MISCELLANEOUS) ×6 IMPLANT
PATTIES SURGICAL .5 X1 (DISPOSABLE) IMPLANT
PUTTY DBM 10CC CALC GRAN (Putty) ×2 IMPLANT
ROD CREO DLX CVD 6.35X40 (Rod) ×2 IMPLANT
ROD CURVED TI 6.35X40 (Rod) ×2 IMPLANT
SCREW PA DLX CREO 7.5X50 (Screw) ×6 IMPLANT
SCREW PA DLX CREO 7.5X55 (Screw) ×4 IMPLANT
SPONGE LAP 4X18 RFD (DISPOSABLE) IMPLANT
SPONGE NEURO XRAY DETECT 1X3 (DISPOSABLE) IMPLANT
SPONGE SURGIFOAM ABS GEL 100 (HEMOSTASIS) IMPLANT
STRIP CLOSURE SKIN 1/2X4 (GAUZE/BANDAGES/DRESSINGS) ×2 IMPLANT
SUT VIC AB 1 CT1 18XBRD ANBCTR (SUTURE) ×1 IMPLANT
SUT VIC AB 1 CT1 8-18 (SUTURE) ×1
SUT VIC AB 2-0 CP2 18 (SUTURE) ×2 IMPLANT
SYR 20ML LL LF (SYRINGE) ×2 IMPLANT
TOWEL GREEN STERILE (TOWEL DISPOSABLE) ×2 IMPLANT
TOWEL GREEN STERILE FF (TOWEL DISPOSABLE) ×2 IMPLANT
TRAY FOLEY MTR SLVR 16FR STAT (SET/KITS/TRAYS/PACK) ×2 IMPLANT
WATER STERILE IRR 1000ML POUR (IV SOLUTION) ×2 IMPLANT

## 2020-02-14 NOTE — H&P (Signed)
Subjective: The patient is a 59 year old obese white male who has complained of back and leg pain consistent with neurogenic claudication.  He has failed medical management.  He was worked up with a lumbar MRI and lumbar x-rays which demonstrated an L4-5 spinal listhesis and spinal stenosis.  I discussed the various treatment options with him.  He has decided proceed with surgery.  Past Medical History:  Diagnosis Date  . Arthritis    joints,hands,elbow  . Cancer ([place])    skin cancer removed  . GERD (gastroesophageal reflux disease)   . Hypertension   . Pneumonia   . Sleep apnea    with cpap    Past Surgical History:  Procedure Laterality Date  . EXCISION OF SKIN TAG N/A 02/06/2016   Procedure: EXCISION OF SKIN TAG;  Surgeon: [name], MD;  Location: [facility] ORS;  Service: General;  Laterality: N/A;  . TONSILLECTOMY      No Known Allergies  Social History   Tobacco Use  . Smoking status: Former Smoker    Years: 22.00    Quit date: 09/05/1996    Years since quitting: 23.4  . Smokeless tobacco: Never Used  . Tobacco comment: Quit in 1998  Substance Use Topics  . Alcohol use: Yes    Comment: occasionally    Family History  Problem Relation Age of Onset  . Bipolar disorder Mother   . Thyroid disease Mother   . Depression Sister   . Prostate cancer Paternal Grandfather   . Prostate cancer Paternal Uncle    Prior to Admission medications   Medication Sig Start Date End Date Taking? Authorizing Provider  aspirin 81 MG tablet Take 81 mg by mouth daily.  04/08/10  Yes [provider]  Azelastine-Fluticasone (DYMISTA) 137-50 MCG/ACT SUSP Place 2 sprays into the nose daily. Patient taking differently: Place 2 sprays into the nose daily as needed (Congestion). 04/15/17  Yes Jerrol Banana., MD  cetirizine-pseudoephedrine (ZYRTEC-D) 5-120 MG tablet Take 1 tablet by mouth daily.  10/07/10  Yes [provider]  Cholecalciferol 1000 UNITS tablet Take 1,000  Units by mouth daily.  04/08/10  Yes [provider]  Dextran 70-Hypromellose (ARTIFICIAL TEARS) 0.1-0.3 % SOLN Place 1 drop into both eyes daily as needed (Dry eye).   Yes [provider]  diclofenac (VOLTAREN) 50 MG EC tablet Take 50 mg by mouth daily as needed for pain. 01/13/20  Yes [provider]  finasteride (PROSCAR) 5 MG tablet TAKE ONE TABLET BY MOUTH DAILY Patient taking differently: Take 5 mg by mouth at bedtime.  12/19/19  Yes Jerrol Banana., MD  fish oil-omega-3 fatty acids 1000 MG capsule Take 2,000 tablets by mouth daily.  04/08/10  Yes [provider]  gabapentin (NEURONTIN) 300 MG capsule TAKE ONE CAPSULE BY MOUTH EVERY MORNING AND TAKE TWO CAPSULES BY MOUTH AT BEDTIME Patient taking differently: Take 300-600 mg by mouth See admin instructions. Take 300 mg in the morning and 600 mg in the evening 05/25/19  Yes Jerrol Banana., MD  losartan-hydrochlorothiazide [facility]) 100-12.5 MG tablet Take 1 tablet by mouth daily. Patient taking differently: Take 1 tablet by mouth daily with lunch. 05/25/19  Yes Jerrol Banana., MD  Misc Natural Products (GLUCOSAMINE CHONDROITIN COMPLX) TABS Take 2 tablets by mouth daily.    Yes [provider]  Multiple Vitamin tablet Take 1 tablet by mouth daily.  04/08/10  Yes [provider]  pravastatin (PRAVACHOL) 20 MG tablet Take 20  mg by mouth at bedtime.  01/19/17 02/01/20 Yes [provider]  Probiotic Product (PROBIOTIC DAILY) CAPS Take 1 tablet by mouth daily.   Yes [provider]  tamsulosin (FLOMAX) 0.4 MG CAPS capsule TAKE ONE CAPSULE BY MOUTH DAILY Patient taking differently: Take 0.4 mg by mouth at bedtime. 09/16/19  Yes Jerrol Banana., MD  Turmeric, Lear Ng, POWD Take 1 tablet by mouth daily.    Yes [provider]  vitamin C (ASCORBIC ACID) 500 MG tablet Take 500 mg by mouth daily.  04/08/10  Yes [provider]  vitamin E 400  UNIT capsule Take 400 Units by mouth daily.  04/08/10  Yes [provider]  zolpidem (AMBIEN) 10 MG tablet Take 1 tablet (10 mg total) by mouth at bedtime as needed for sleep. 02/08/20 03/09/20 Yes Jerrol Banana., MD  oseltamivir (TAMIFLU) 75 MG capsule Take 1 capsule (75 mg total) by mouth 2 (two) times daily. Patient not taking: Reported on 05/20/2018 03/24/18   Jerrol Banana., MD     Review of Systems  Positive ROS: As above  All other systems have been reviewed and were otherwise negative with the exception of those mentioned in the HPI and as above.  Objective: Vital signs in last 24 hours: Temp:  [97.5 F (36.4 C)] 97.5 F (36.4 C) (12/15 0753) Pulse Rate:  [65] 65 (12/15 0753) Resp:  [20] 20 (12/15 0753) BP: (135)/(84) 135/84 (12/15 0753) SpO2:  [99 %] 99 % (12/15 0753) Weight:  [146.8 kg] 146.8 kg (12/15 0753) Estimated body mass index is 45.14 kg/m as calculated from the following:   Height as of this encounter: 5\' 11"  (1.803 m).   Weight as of this encounter: 146.8 kg.   General Appearance: Alert Head: Normocephalic, without obvious abnormality, atraumatic Eyes: PERRL, conjunctiva/corneas clear, EOM's intact,    Ears: Normal  Throat: Normal  Neck: Supple, Back: unremarkable Lungs: Clear to auscultation bilaterally, respirations unlabored Heart: Regular rate and rhythm, no murmur, rub or gallop Abdomen: Soft, non-tender Extremities: Extremities normal, atraumatic, no cyanosis or edema Skin: unremarkable  NEUROLOGIC:   Mental status: alert and oriented,Motor Exam - grossly normal Sensory Exam - grossly normal Reflexes:  Coordination - grossly normal Gait - grossly normal Balance - grossly normal Cranial Nerves: I: smell Not tested  II: visual acuity  OS: Normal  OD: Normal   II: visual fields Full to confrontation  II: pupils Equal, round, reactive to light  III,VII: ptosis None  III,IV,VI: extraocular muscles  Full ROM  V: mastication  Normal  V: facial light touch sensation  Normal  V,VII: corneal reflex  Present  VII: facial muscle function - upper  Normal  VII: facial muscle function - lower Normal  VIII: hearing Not tested  IX: soft palate elevation  Normal  IX,X: gag reflex Present  XI: trapezius strength  5/5  XI: sternocleidomastoid strength 5/5  XI: neck flexion strength  5/5  XII: tongue strength  Normal    Data Review Lab Results  Component Value Date   WBC 7.3 02/07/2020   HGB 14.3 02/07/2020   HCT 43.8 02/07/2020   MCV 88.8 02/07/2020   PLT 247 02/07/2020   Lab Results  Component Value Date   NA 138 02/07/2020   K 3.8 02/07/2020   CL 102 02/07/2020   CO2 26 02/07/2020   BUN 12 02/07/2020   CREATININE 0.71 02/07/2020   GLUCOSE 108 (H) 02/07/2020   No results found for: INR,  PROTIME  Assessment/Plan: L4-5 spondylolisthesis, lumbago, spinal stenosis, neurogenic claudication, lumbar radiculopathy: I have discussed the situation with the patient.  I reviewed his imaging studies with him and pointed out the abnormalities.  We have discussed the various treatment options including surgery.  I have described the surgical treatment option of an L4-5 decompression, instrumentation and fusion.  I have shown him surgical models.  I have given him a surgical pamphlet.  We have discussed the risk, benefits, alternatives, expected postoperative course, and likelihood of achieving our goals with surgery.  I have answered all his questions.  He has decided proceed with surgery.   Ophelia Charter 02/14/2020 8:11 AM

## 2020-02-14 NOTE — Progress Notes (Signed)
Orthopedic Tech Progress Note Patient Details:  Terry Horn 01/06/1961 [id number] Fitted Aspen Lumbar to patient and left at bedside for patient for out of bed use.  Ortho Devices Type of Ortho Device: Lumbar corsett Ortho Device/Splint Interventions: Ordered,Application   Post Interventions Patient Tolerated: Well Instructions Provided: Adjustment of device   Petra Kuba 02/14/2020, 5:21 PM

## 2020-02-14 NOTE — Anesthesia Procedure Notes (Addendum)
Procedure Name: Intubation Date/Time: 02/14/2020 8:43 AM Performed by: Griffin Dakin, CRNA Pre-anesthesia Checklist: Patient identified, Emergency Drugs available, Suction available and Patient being monitored Patient Re-evaluated:Patient Re-evaluated prior to induction Oxygen Delivery Method: Circle system utilized Preoxygenation: Pre-oxygenation with 100% oxygen Induction Type: IV induction Ventilation: Oral airway inserted - appropriate to patient size and Two handed mask ventilation required Laryngoscope Size: Mac and 4 Grade View: Grade II Tube type: Oral Tube size: 7.5 mm Number of attempts: 1 Airway Equipment and Method: Stylet and Oral airway Placement Confirmation: ETT inserted through vocal cords under direct vision,  positive ETCO2 and breath sounds checked- equal and bilateral Secured at: 22 cm Tube secured with: Tape Dental Injury: Teeth and Oropharynx as per pre-operative assessment

## 2020-02-14 NOTE — Anesthesia Preprocedure Evaluation (Signed)
Anesthesia Evaluation  Patient identified by MRN, date of birth, ID band Patient awake    Reviewed: Allergy & Precautions, NPO status , Patient's Chart, lab work & pertinent test results  Airway Mallampati: II  TM Distance: >3 FB Neck ROM: Full    Dental  (+) Teeth Intact, Dental Advisory Given   Pulmonary former smoker,    breath sounds clear to auscultation       Cardiovascular hypertension,  Rhythm:Regular Rate:Normal     Neuro/Psych    GI/Hepatic   Endo/Other    Renal/GU      Musculoskeletal   Abdominal (+) + obese,   Peds  Hematology   Anesthesia Other Findings   Reproductive/Obstetrics                             Anesthesia Physical Anesthesia Plan  ASA: III  Anesthesia Plan: General   Post-op Pain Management:    Induction: Intravenous  PONV Risk Score and Plan: Ondansetron and Dexamethasone  Airway Management Planned: Oral ETT  Additional Equipment:   Intra-op Plan:   Post-operative Plan: Extubation in OR  Informed Consent: I have reviewed the patients History and Physical, chart, labs and discussed the procedure including the risks, benefits and alternatives for the proposed anesthesia with the patient or authorized representative who has indicated his/her understanding and acceptance.     Dental advisory given  Plan Discussed with:   Anesthesia Plan Comments:         Anesthesia Quick Evaluation

## 2020-02-14 NOTE — Progress Notes (Signed)
Subjective: The patient is alert and pleasant.  He looks well.  He is in no apparent distress.  Objective: Vital signs in last 24 hours: Temp:  [97.5 F (36.4 C)] 97.5 F (36.4 C) (12/15 0753) Pulse Rate:  [65-68] 68 (12/15 1345) Resp:  [13-20] 13 (12/15 1345) BP: (110-135)/(45-84) 114/59 (12/15 1345) SpO2:  [99 %-100 %] 99 % (12/15 1345) Weight:  [146.8 kg] 146.8 kg (12/15 0753) Estimated body mass index is 45.14 kg/m as calculated from the following:   Height as of this encounter: 5\' 11"  (1.803 m).   Weight as of this encounter: 146.8 kg.   Intake/Output from previous day: No intake/output data recorded. Intake/Output this shift: Total I/O In: 762.2 [I.V.:62.2; Blood:100; Other:50; IV Piggyback:550] Out: 315 [Urine:575; Blood:300]  Physical exam the patient is alert and pleasant.  He is moving his lower extremities well.  Lab Results: No results for input(s): WBC, HGB, HCT, PLT in the last 72 hours. BMET No results for input(s): NA, K, CL, CO2, GLUCOSE, BUN, CREATININE, CALCIUM in the last 72 hours.  Studies/Results: DG Lumbar Spine 2-3 Views  Result Date: 02/14/2020 CLINICAL DATA:  L4-5 PLIF EXAM: OPERATIVE LUMBAR SPINE 2 VIEW(S) COMPARISON:  12/28/2018 FINDINGS: 2 C-arm fluoroscopic images were obtained intraoperatively and submitted for post operative interpretation. Interval placement of transpedicular screws bilaterally at the L4 and L5 levels with intervertebral disc spacer. No apparent complication on intraoperative films. Please see the performing provider's procedural report for further detail. IMPRESSION: As above. Electronically Signed   By: Davina Poke D.O.   On: 02/14/2020 12:51   DG Lumbar Spine 2-3 Views  Result Date: 02/14/2020 CLINICAL DATA:  Localization images for posterior fusion EXAM: LUMBAR SPINE - 2-3 VIEW COMPARISON:  November 22, 2018 FINDINGS: Initial cross-table lateral lumbar image is labeled #1. On this image, metallic probe tip is  inferior to the inferior most aspect of the L4 vertebral body. No fracture or spondylolisthesis appreciable. On second submitted cross-table lateral lumbar image labeled #2, time stamped 4:00:86, metallic probe tip is posterior to the superior aspect of L5 with cutting tool overlying a portion of the L4 spinous process. No appreciable fracture or spondylolisthesis. Disc space narrowing noted at L3-4. IMPRESSION: On final submitted cross-table lateral image, metallic probe tip is posterior to the superior aspect of the L5 vertebral body with cutting tool overlying a portion of the L4 spinous process. No evident fracture. Disc space narrowing at L3-4 noted. Electronically Signed   By: Lowella Grip III M.D.   On: 02/14/2020 10:29   DG C-Arm 1-60 Min  Result Date: 02/14/2020 CLINICAL DATA:  L4-5 PLIF EXAM: OPERATIVE LUMBAR SPINE 2 VIEW(S) COMPARISON:  12/28/2018 FINDINGS: 2 C-arm fluoroscopic images were obtained intraoperatively and submitted for post operative interpretation. Interval placement of transpedicular screws bilaterally at the L4 and L5 levels with intervertebral disc spacer. No apparent complication on intraoperative films. Please see the performing provider's procedural report for further detail. IMPRESSION: As above. Electronically Signed   By: Davina Poke D.O.   On: 02/14/2020 12:51    Assessment/Plan: The patient is doing well.  I spoke with his wife and son.  LOS: 0 days     Ophelia Charter 02/14/2020, 2:14 PM

## 2020-02-14 NOTE — Op Note (Signed)
Brief history: The patient is a 59 year old white male with complaint of back and leg pain consistent with neurogenic claudication.  He has failed medical management.  He was worked up with lumbar x-rays and lumbar MRI which demonstrated an L4-5 spondylolisthesis, facet arthropathy, spinal stenosis, etc.  I discussed the various treatment options with him.  He has decided proceed with surgery.  Preoperative diagnosis: L4-5 spondylolisthesis, facet arthropathy, degenerative disc disease, spinal stenosis compressing both the L4 and the L5 nerve roots; lumbago; lumbar radiculopathy; neurogenic claudication  Postoperative diagnosis: The same   Procedure: Bilateral L4-5 laminotomy/foraminotomies/medial facetectomy to decompress the bilateral bilateral L4-5 nerve roots(the work required to do this was in addition to the work required to do the posterior lumbar interbody fusion because of the patient's spinal stenosis, facet arthropathy. Etc. requiring a wide decompression of the nerve roots.);  L4-5 transforaminal lumbar interbody fusion with local morselized autograft bone and Zimmer DBM; insertion of interbody prosthesis at L4-5 (globus peek expandable interbody prosthesis); posterior nonsegmental instrumentation from L4 to L5 with globus titanium pedicle screws and rods; posterior lateral arthrodesis at L4-5 with local morselized autograft bone and Zimmer DBM.  Surgeon: Dr. Earle Gell  Asst.: Arnetha Massy, NP  Anesthesia: Gen. endotracheal  Estimated blood loss: 250 cc  Drains: None  Complications: None  Description of procedure: The patient was brought to the operating room by the anesthesia team. General endotracheal anesthesia was induced. The patient was turned to the prone position on the Wilson frame. The patient's lumbosacral region was then prepared with Betadine scrub and Betadine solution. Sterile drapes were applied.  I then injected the area to be incised with Marcaine with  epinephrine solution. I then used the scalpel to make a linear midline incision over the L4-5 interspace. I then used electrocautery to perform a bilateral subperiosteal dissection exposing the spinous process and lamina of L4 and L5. We then obtained intraoperative radiograph to confirm our location. We then inserted the Verstrac retractor to provide exposure.  I began the decompression by using the high speed drill to perform laminotomies at L4-5 bilaterally. We then used the Kerrison punches to widen the laminotomy and removed the ligamentum flavum at L4-5 bilaterally. We used the Kerrison punches to remove the medial facets at L4-5 bilaterally. We performed wide foraminotomies about the bilateral L4 and L5 nerve roots completing the decompression.  We now turned our attention to the posterior lumbar interbody fusion. I used a scalpel to incise the intervertebral disc at L4-5 bilaterally. I then performed a partial intervertebral discectomy at L4-5 bilaterally using the pituitary forceps. We prepared the vertebral endplates at Y8-5 bilaterally for the fusion by removing the soft tissues with the curettes. We then used the trial spacers to pick the appropriate sized interbody prosthesis. We prefilled his prosthesis with a combination of local morselized autograft bone that we obtained during the decompression as well as Zimmer DBM. We inserted the prefilled prosthesis into the interspace at L4-5, we then turned and expanded the prosthesis. There was a good snug fit of the prosthesis in the interspace. We then filled and the remainder of the intervertebral disc space with local morselized autograft bone and Zimmer DBM. This completed the posterior lumbar interbody arthrodesis.  During the decompression and insertion of the prosthesis the assistant protected the thecal sac and nerve roots with the D'Errico retractor.  We now turned attention to the instrumentation. Under fluoroscopic guidance we cannulated the  bilateral L4 and L5 pedicles with the bone probe. We then  removed the bone probe. We then tapped the pedicle with a 6.5 millimeter tap. We then removed the tap. We probed inside the tapped pedicle with a ball probe to rule out cortical breaches. We then inserted a 7.5 x 50 and 55 millimeter pedicle screw into the L4 and L5 pedicles bilaterally under fluoroscopic guidance. We then palpated along the medial aspect of the pedicles to rule out cortical breaches. There were none. The nerve roots were not injured. We then connected the unilateral pedicle screws with a lordotic rod. We compressed the construct and secured the rod in place with the caps. We then tightened the caps appropriately. This completed the instrumentation from L4-5 bilaterally.  We now turned our attention to the posterior lateral arthrodesis at L4-5 bilaterally. We used the high-speed drill to decorticate the remainder of the facets, pars, transverse process at L4-5 bilaterally. We then applied a combination of local morselized autograft bone and Zimmer DBM over these decorticated posterior lateral structures. This completed the posterior lateral arthrodesis.  We then obtained hemostasis using bipolar electrocautery. We irrigated the wound out with bacitracin solution. We inspected the thecal sac and nerve roots and noted they were well decompressed. We then removed the retractor.  We injected Exparel . We reapproximated patient's thoracolumbar fascia with interrupted #1 Vicryl suture. We reapproximated patient's subcutaneous tissue with interrupted 2-0 Vicryl suture. The reapproximated patient's skin with Steri-Strips and benzoin. The wound was then coated with bacitracin ointment. A sterile dressing was applied. The drapes were removed. The patient was subsequently returned to the supine position where they were extubated by the anesthesia team. He was then transported to the post anesthesia care unit in stable condition. All sponge  instrument and needle counts were reportedly correct at the end of this case.

## 2020-02-14 NOTE — Anesthesia Postprocedure Evaluation (Signed)
Anesthesia Post Note  Patient: Terry Horn.  Procedure(s) Performed: POSTERIOR LUMBAR INTERBODY FUSION, INTERBODY PROSTEHSIS,POSTERIOR INSTRUMENTATION LUMBAR FOUR-FIVE (N/A Spine Lumbar)     Patient location during evaluation: PACU Anesthesia Type: General Level of consciousness: awake and alert Pain management: pain level controlled Vital Signs Assessment: post-procedure vital signs reviewed and stable Respiratory status: spontaneous breathing, nonlabored ventilation, respiratory function stable and patient connected to nasal cannula oxygen Cardiovascular status: blood pressure returned to baseline and stable Postop Assessment: no apparent nausea or vomiting Anesthetic complications: no   No complications documented.  Last Vitals:  Vitals:   02/14/20 1430 02/14/20 1500  BP: (!) 103/51 (!) 103/51  Pulse: 62 61  Resp: 12 17  Temp:  (!) 36.4 C  SpO2: 96% 96%    Last Pain:  Vitals:   02/14/20 1530  TempSrc:   PainSc: 0-No pain                 Lavinia Mcneely COKER

## 2020-02-14 NOTE — Progress Notes (Signed)
Pharmacy Antibiotic Note  Beaumont Austad. is a 59 y.o. male admitted on 02/14/2020 s/p lumbar surgery. Pharmacy has been consulted for vancomycin dosing for surgical prophylaxis. No drain placed. SCr 0.71 (last 12/8).  Pre-op vancomycin dose given at 0718 this AM.  Plan: Vancomycin 1250mg  IV x 1 dose to be given 12hrs post-op per protocol, as patient does not have a drain Monitor SCr Pharmacy will sign off consult and monitor peripherally  Height: 5\' 11"  (180.3 cm) Weight: (!) 146.8 kg (323 lb 10.2 oz) IBW/kg (Calculated) : 75.3  Temp (24hrs), Avg:97.5 F (36.4 C), Min:97.5 F (36.4 C), Max:97.5 F (36.4 C)  No results for input(s): WBC, CREATININE, LATICACIDVEN, VANCOTROUGH, VANCOPEAK, VANCORANDOM, GENTTROUGH, GENTPEAK, GENTRANDOM, TOBRATROUGH, TOBRAPEAK, TOBRARND, AMIKACINPEAK, AMIKACINTROU, AMIKACIN in the last 168 hours.  Estimated Creatinine Clearance: 146.1 mL/min (by C-G formula based on SCr of 0.71 mg/dL).    No Known Allergies   Arturo Morton, PharmD, BCPS Please check AMION for all [place] contact numbers Clinical Pharmacist 02/14/2020 4:02 PM

## 2020-02-14 NOTE — Transfer of Care (Signed)
Immediate Anesthesia Transfer of Care Note  Patient: Terry Horn.  Procedure(s) Performed: POSTERIOR LUMBAR INTERBODY FUSION, INTERBODY PROSTEHSIS,POSTERIOR INSTRUMENTATION LUMBAR FOUR-FIVE (N/A Spine Lumbar)  Patient Location: PACU  Anesthesia Type:General  Level of Consciousness: awake, alert  and oriented  Airway & Oxygen Therapy: Patient Spontanous Breathing and Patient connected to nasal cannula oxygen  Post-op Assessment: Report given to RN and Post -op Vital signs reviewed and stable  Post vital signs: Reviewed and stable  Last Vitals:  Vitals Value Taken Time  BP    Temp    Pulse    Resp    SpO2      Last Pain:  Vitals:   02/14/20 0753  TempSrc: Oral  PainSc:       Patients Stated Pain Goal: 3 (72/76/18 4859)  Complications: No complications documented.

## 2020-02-15 LAB — BASIC METABOLIC PANEL
Anion gap: 9 (ref 5–15)
BUN: 13 mg/dL (ref 6–20)
CO2: 27 mmol/L (ref 22–32)
Calcium: 8.5 mg/dL — ABNORMAL LOW (ref 8.9–10.3)
Chloride: 100 mmol/L (ref 98–111)
Creatinine, Ser: 0.77 mg/dL (ref 0.61–1.24)
GFR, Estimated: >60 mL/min (ref >60)
Glucose, Bld: 140 mg/dL — ABNORMAL HIGH (ref 70–99)
Potassium: 4.1 mmol/L (ref 3.5–5.1)
Sodium: 136 mmol/L (ref 135–145)

## 2020-02-15 LAB — CBC
HCT: 34.5 % — ABNORMAL LOW (ref 39.0–52.0)
Hemoglobin: 11.6 g/dL — ABNORMAL LOW (ref 13.0–17.0)
MCH: 29.4 pg (ref 26.0–34.0)
MCHC: 33.6 g/dL (ref 30.0–36.0)
MCV: 87.3 fL (ref 80.0–100.0)
Platelets: 225 10*3/uL (ref 150–400)
RBC: 3.95 MIL/uL — ABNORMAL LOW (ref 4.22–5.81)
RDW: 12.6 % (ref 11.5–15.5)
WBC: 13.2 10*3/uL — ABNORMAL HIGH (ref 4.0–10.5)
nRBC: 0 % (ref 0.0–0.2)

## 2020-02-15 NOTE — Progress Notes (Signed)
Subjective: The patient is alert and pleasant.  His back is appropriately sore.  He looks well.  He wants to go home tomorrow.  Objective: Vital signs in last 24 hours: Temp:  [97.5 F (36.4 C)-98.4 F (36.9 C)] 98.2 F (36.8 C) (12/16 1217) Pulse Rate:  [58-73] 70 (12/16 1217) Resp:  [11-20] 17 (12/16 1217) BP: (102-120)/(45-61) 116/56 (12/16 1217) SpO2:  [95 %-100 %] 98 % (12/16 1217) Estimated body mass index is 45.14 kg/m as calculated from the following:   Height as of this encounter: 5\' 11"  (1.803 m).   Weight as of this encounter: 146.8 kg.   Intake/Output from previous day: 12/15 0701 - 12/16 0700 In: 982.2 [P.O.:120; I.V.:62.2; Blood:100; IV Piggyback:550] Out: 1125 [Urine:825; Blood:300] Intake/Output this shift: No intake/output data recorded.  Physical exam the patient is alert and pleasant.  His lower extremity strength is grossly normal.  Lab Results: Recent Labs    02/15/20 0206  WBC 13.2*  HGB 11.6*  HCT 34.5*  PLT 225   BMET Recent Labs    02/15/20 0206  NA 136  K 4.1  CL 100  CO2 27  GLUCOSE 140*  BUN 13  CREATININE 0.77  CALCIUM 8.5*    Studies/Results: DG Lumbar Spine 2-3 Views  Result Date: 02/14/2020 CLINICAL DATA:  L4-5 PLIF EXAM: OPERATIVE LUMBAR SPINE 2 VIEW(S) COMPARISON:  12/28/2018 FINDINGS: 2 C-arm fluoroscopic images were obtained intraoperatively and submitted for post operative interpretation. Interval placement of transpedicular screws bilaterally at the L4 and L5 levels with intervertebral disc spacer. No apparent complication on intraoperative films. Please see the performing provider's procedural report for further detail. IMPRESSION: As above. Electronically Signed   By: Davina Poke D.O.   On: 02/14/2020 12:51   DG Lumbar Spine 2-3 Views  Result Date: 02/14/2020 CLINICAL DATA:  Localization images for posterior fusion EXAM: LUMBAR SPINE - 2-3 VIEW COMPARISON:  November 22, 2018 FINDINGS: Initial cross-table lateral  lumbar image is labeled #1. On this image, metallic probe tip is inferior to the inferior most aspect of the L4 vertebral body. No fracture or spondylolisthesis appreciable. On second submitted cross-table lateral lumbar image labeled #2, time stamped 9:16:38, metallic probe tip is posterior to the superior aspect of L5 with cutting tool overlying a portion of the L4 spinous process. No appreciable fracture or spondylolisthesis. Disc space narrowing noted at L3-4. IMPRESSION: On final submitted cross-table lateral image, metallic probe tip is posterior to the superior aspect of the L5 vertebral body with cutting tool overlying a portion of the L4 spinous process. No evident fracture. Disc space narrowing at L3-4 noted. Electronically Signed   By: Lowella Grip III M.D.   On: 02/14/2020 10:29   DG C-Arm 1-60 Min  Result Date: 02/14/2020 CLINICAL DATA:  L4-5 PLIF EXAM: OPERATIVE LUMBAR SPINE 2 VIEW(S) COMPARISON:  12/28/2018 FINDINGS: 2 C-arm fluoroscopic images were obtained intraoperatively and submitted for post operative interpretation. Interval placement of transpedicular screws bilaterally at the L4 and L5 levels with intervertebral disc spacer. No apparent complication on intraoperative films. Please see the performing provider's procedural report for further detail. IMPRESSION: As above. Electronically Signed   By: Davina Poke D.O.   On: 02/14/2020 12:51    Assessment/Plan: Postop day #1: The patient is doing well.  We will continue to mobilize him with PT and OT.  He will likely go home tomorrow.  LOS: 1 day     Terry Horn 02/15/2020, 12:40 PM

## 2020-02-15 NOTE — Progress Notes (Signed)
Pt. Already wearing cpap. Tolerating well at this time.

## 2020-02-15 NOTE — Discharge Instructions (Signed)

## 2020-02-15 NOTE — Evaluation (Signed)
Physical Therapy Evaluation Patient Details Name: Terry Horn. MRN: [id number] DOB: 1960/08/08 Today's Date: 02/15/2020   History of Present Illness  Patient is a 59 year old male admitted 12/15 for POSTERIOR LUMBAR INTERBODY FUSION, INTERBODY PROSTEHSIS,POSTERIOR INSTRUMENTATION LUMBAR FOUR-FIVE. PMH includes arthritis, GERD, HTN, PNA  Clinical Impression  Pt presents to PT s/p L4-5 PLIF. Pt is mobilizing well, reporting he feels near his baseline with mild discomfort in back. Pt does reports some feelings of dizziness/lightheadedness, possibly related to pain medications, as BP is stable 148/64. Pt is able to perform all mobility required in the home independently or at a modI level. Pt is encouraged to ambulate multiple times a day for the remainder of hospitalization. Pt has no further acute PT needs at this time, acute PT signing off.    Follow Up Recommendations No PT follow up    Equipment Recommendations  None recommended by PT    Recommendations for Other Services       Precautions / Restrictions Precautions Precautions: Back Precaution Booklet Issued: Yes (comment) Precaution Comments: pt able to recall 3/3 back precautions, precaution handout in room Required Braces or Orthoses: Spinal Brace Spinal Brace: Lumbar corset;Applied in sitting position Restrictions Weight Bearing Restrictions: No      Mobility  Bed Mobility Overal bed mobility: Modified Independent             General bed mobility comments: increased time, log roll technique    Transfers Overall transfer level: Modified independent Equipment used: None             General transfer comment: use of railing to push up into standing  Ambulation/Gait Ambulation/Gait assistance: Modified independent (Device/Increase time) Gait Distance (Feet): 150 Feet Assistive device: None Gait Pattern/deviations: Step-through pattern Gait velocity: functional Gait velocity interpretation: 1.31 -  2.62 ft/sec, indicative of limited community ambulator General Gait Details: pt with slowed step-through gait  Stairs Stairs: Yes Stairs assistance: Modified independent (Device/Increase time) Stair Management: One rail Right;Alternating pattern Number of Stairs: 10    Wheelchair Mobility    Modified Rankin (Stroke Patients Only)       Balance Overall balance assessment: Modified Independent                                           Pertinent Vitals/Pain Pain Assessment: Faces Faces Pain Scale: Hurts a little bit Pain Location: back Pain Descriptors / Indicators: Grimacing Pain Intervention(s): Monitored during session    Home Living Family/patient expects to be discharged to:: Private residence Living Arrangements: Spouse/significant other Available Help at Discharge: Family Type of Home: House Home Access: Stairs to enter Entrance Stairs-Rails: Psychiatric nurse of Steps: 4-5 Home Layout: One level Home Equipment: Environmental consultant - 2 wheels;Toilet riser;Shower seat      Prior Function Level of Independence: Independent               Hand Dominance   Dominant Hand: Right    Extremity/Trunk Assessment   Upper Extremity Assessment Upper Extremity Assessment: Overall WFL for tasks assessed    Lower Extremity Assessment Lower Extremity Assessment: Overall WFL for tasks assessed    Cervical / Trunk Assessment Cervical / Trunk Assessment: Other exceptions Cervical / Trunk Exceptions: s/p spinal surgery  Communication   Communication: No difficulties  Cognition Arousal/Alertness: Awake/alert Behavior During Therapy: WFL for tasks assessed/performed Overall Cognitive Status: Within Functional Limits for tasks assessed  General Comments General comments (skin integrity, edema, etc.): VSS on RA    Exercises     Assessment/Plan    PT Assessment Patent does not need  any further PT services  PT Problem List         PT Treatment Interventions      PT Goals (Current goals can be found in the Care Plan section)  Acute Rehab PT Goals Patient Stated Goal: home    Frequency     Barriers to discharge        Co-evaluation               AM-PAC PT "6 Clicks" Mobility  Outcome Measure Help needed turning from your back to your side while in a flat bed without using bedrails?: None Help needed moving from lying on your back to sitting on the side of a flat bed without using bedrails?: None Help needed moving to and from a bed to a chair (including a wheelchair)?: None Help needed standing up from a chair using your arms (e.g., wheelchair or bedside chair)?: None Help needed to walk in hospital room?: None Help needed climbing 3-5 steps with a railing? : None 6 Click Score: 24    End of Session Equipment Utilized During Treatment: Back brace Activity Tolerance: Patient tolerated treatment well Patient left: in bed;with call bell/phone within reach Nurse Communication: Mobility status      Time: 5102-5852 PT Time Calculation (min) (ACUTE ONLY): 22 min   Charges:   PT Evaluation $PT Eval Low Complexity: [place], PT, DPT Acute Rehabilitation Pager: [phone]   Zenaida Niece 02/15/2020, 9:12 AM

## 2020-02-15 NOTE — Evaluation (Signed)
Occupational Therapy Evaluation Patient Details Name: Terry Horn. MRN: [id number] DOB: [birth year] Today's Date: 02/15/2020    History of Present Illness Patient is a 59 year old male admitted 12/15 for POSTERIOR LUMBAR INTERBODY FUSION, INTERBODY PROSTEHSIS,POSTERIOR INSTRUMENTATION LUMBAR FOUR-FIVE. PMH includes arthritis, GERD, HTN, PNA   Clinical Impression   Completed patient education re: back precautions and how to maintain during ADLs. Patient return demo with adequate technique with UB/LB dressing, toilet transfer, peri care, and sink side g/h. Patient also demo log roll technique in/out of bed safely. No further acute OT needs at this time, will sign off.    Follow Up Recommendations  No OT follow up    Equipment Recommendations  None recommended by OT       Precautions / Restrictions Precautions Precautions: Back Precaution Booklet Issued: Yes (comment) Precaution Comments: can ambulate to bathroom without brace, can remove to shower Required Braces or Orthoses: Spinal Brace Spinal Brace: Lumbar corset;Applied in sitting position Restrictions Weight Bearing Restrictions: No      Mobility Bed Mobility Overal bed mobility: Modified Independent             General bed mobility comments: educated patient on log roll technique, patient return demo in/out of bed with adequate technique    Transfers Overall transfer level: Modified independent Equipment used: None                  Balance Overall balance assessment: Modified Independent                                         ADL either performed or assessed with clinical judgement   ADL Overall ADL's : Independent                                       General ADL Comments: patient demonstrate figure 4 to don LB clothing, ambulate and perform toilet transfer, sink side g/h without physical assistance while maintaining back precautions     Vision Baseline  Vision/History: Wears glasses Wears Glasses: At all times              Pertinent Vitals/Pain Pain Assessment: Faces Faces Pain Scale: Hurts a little bit Pain Location: back Pain Descriptors / Indicators: Discomfort Pain Intervention(s): Premedicated before session     Hand Dominance Right   Extremity/Trunk Assessment Upper Extremity Assessment Upper Extremity Assessment: Overall WFL for tasks assessed   Lower Extremity Assessment Lower Extremity Assessment: Defer to PT evaluation       Communication Communication Communication: No difficulties   Cognition Arousal/Alertness: Awake/alert Behavior During Therapy: WFL for tasks assessed/performed Overall Cognitive Status: Within Functional Limits for tasks assessed                                                Home Living Family/patient expects to be discharged to:: Private residence Living Arrangements: Spouse/significant other Available Help at Discharge: Family Type of Home: House Home Access: Stairs to enter Technical brewer of Steps: 4-5 Entrance Stairs-Rails: Right;Left Home Layout: One level     Bathroom Shower/Tub: Teacher, early years/pre: Standard     Home Equipment: Environmental consultant - 2 wheels;Toilet  riser;Shower seat          Prior Functioning/Environment Level of Independence: Independent                 OT Problem List: Pain;Decreased activity tolerance         OT Goals(Current goals can be found in the care plan section) Acute Rehab OT Goals Patient Stated Goal: home OT Goal Formulation: All assessment and education complete, DC therapy   AM-PAC OT "6 Clicks" Daily Activity     Outcome Measure Help from another person eating meals?: None Help from another person taking care of personal grooming?: None Help from another person toileting, which includes using toliet, bedpan, or urinal?: None Help from another person bathing (including washing, rinsing,  drying)?: None Help from another person to put on and taking off regular upper body clothing?: None Help from another person to put on and taking off regular lower body clothing?: None 6 Click Score: 24   End of Session Nurse Communication: Mobility status  Activity Tolerance: Patient tolerated treatment well Patient left: in bed;with call bell/phone within reach  OT Visit Diagnosis: Pain Pain - part of body:  (back)                Time: 7482-7078 OT Time Calculation (min): 39 min Charges:  OT General Charges $OT Visit: 1 Visit OT Evaluation $OT Eval Low Complexity: 1 Low OT Treatments $Self Care/Home Management : 23-37 mins  Delbert Phenix OT OT pager: [place] 02/15/2020, 8:25 AM

## 2020-02-16 MED ORDER — DOCUSATE SODIUM 100 MG PO CAPS: 100.0000 mg | ORAL_CAPSULE | Freq: Two times a day (BID) | ORAL | 0 refills | Status: DC

## 2020-02-16 MED ORDER — OXYCODONE-ACETAMINOPHEN 5-325 MG PO TABS: 1.0000 | ORAL_TABLET | ORAL | 0 refills | Status: DC | PRN

## 2020-02-16 MED ORDER — OXYCODONE-ACETAMINOPHEN 5-325 MG PO TABS: 1.0000 | ORAL_TABLET | ORAL | Status: DC | PRN

## 2020-02-16 MED ORDER — CYCLOBENZAPRINE HCL 10 MG PO TABS: 10.0000 mg | ORAL_TABLET | Freq: Three times a day (TID) | ORAL | 0 refills | Status: DC | PRN

## 2020-02-16 MED FILL — Heparin Sodium (Porcine) Inj 1000 Unit/ML: INTRAMUSCULAR | Qty: 30 | Status: AC

## 2020-02-16 MED FILL — Sodium Chloride IV Soln 0.9%: INTRAVENOUS | Qty: 1000 | Status: AC

## 2020-02-16 NOTE — Progress Notes (Signed)
Patient is discharged from room 3C10 at this time. Alert and in stable condition. IV site d/c'd and instructions read to patient and spouse with understanding verbalized and all questions answered. Left unit via wheelchair with all belongings at side. 

## 2020-02-16 NOTE — Discharge Summary (Signed)
Physician Discharge Summary  Patient ID: Terry Horn. MRN: [id number] DOB/AGE: 1960/07/05 59 y.o.  Admit date: 02/14/2020 Discharge date: 02/16/2020  Admission Diagnoses: Lumbar spondylolisthesis, lumbar facet arthropathy, lumbar spinal stenosis, lumbago, lumbar radiculopathy, neurogenic claudication Discharge Diagnoses: The same Active Problems:   Spondylolisthesis, lumbar region   Discharged Condition: good  Hospital Course: I performed an L4-5 decompression, instrumentation and fusion on the patient on 02/14/2020.  The surgery went well.  The patient's postoperative course was unremarkable.  On postoperative day #2 he requested discharge to home.  He was given written and oral discharge instructions.  All his questions were answered.  Consults: PT, OT, care management Significant Diagnostic Studies: None Treatments: L4-5 decompression, instrumentation and fusion. Discharge Exam: Blood pressure 125/70, pulse 83, temperature 99.2 F (37.3 C), temperature source Oral, resp. rate 20, height 5\' 11"  (1.803 m), weight (!) 146.8 kg, SpO2 99 %. The patient is alert and pleasant.  He looks well.  His strength is normal.  Disposition: Home  Discharge Instructions    Call MD for:  difficulty breathing, headache or visual disturbances   Complete by: As directed    Call MD for:  extreme fatigue   Complete by: As directed    Call MD for:  hives   Complete by: As directed    Call MD for:  persistant dizziness or light-headedness   Complete by: As directed    Call MD for:  persistant nausea and vomiting   Complete by: As directed    Call MD for:  redness, tenderness, or signs of infection (pain, swelling, redness, odor or green/yellow discharge around incision site)   Complete by: As directed    Call MD for:  severe uncontrolled pain   Complete by: As directed    Call MD for:  temperature >100.4   Complete by: As directed    Diet - low sodium heart healthy   Complete by: As  directed    Discharge instructions   Complete by: As directed    Call [phone] for a followup appointment. Take a stool softener while you are using pain medications.   Driving Restrictions   Complete by: As directed    Do not drive for 2 weeks.   Increase activity slowly   Complete by: As directed    Lifting restrictions   Complete by: As directed    Do not lift more than 5 pounds. No excessive bending or twisting.   May shower / Bathe   Complete by: As directed    Remove the dressing for 3 days after surgery.  You may shower, but leave the incision alone.   Remove dressing in 24 hours   Complete by: As directed      Allergies as of 02/16/2020   No Known Allergies     Medication List    STOP taking these medications   diclofenac 50 MG EC tablet Commonly known as: VOLTAREN   zolpidem 10 MG tablet Commonly known as: AMBIEN     TAKE these medications   Artificial Tears 0.1-0.3 % Soln Generic drug: Dextran 70-Hypromellose Place 1 drop into both eyes daily as needed (Dry eye).   aspirin 81 MG tablet Take 81 mg by mouth daily.   Azelastine-Fluticasone 137-50 MCG/ACT Susp Commonly known as: Dymista Place 2 sprays into the nose daily. What changed:   when to take this  reasons to take this   cetirizine-pseudoephedrine 5-120 MG tablet Commonly known as: ZYRTEC-D Take 1 tablet by mouth daily.  Cholecalciferol 25 MCG (1000 UT) tablet Take 1,000 Units by mouth daily.   cyclobenzaprine 10 MG tablet Commonly known as: FLEXERIL Take 1 tablet (10 mg total) by mouth 3 (three) times daily as needed for muscle spasms.   docusate sodium 100 MG capsule Commonly known as: COLACE Take 1 capsule (100 mg total) by mouth 2 (two) times daily.   finasteride 5 MG tablet Commonly known as: PROSCAR TAKE ONE TABLET BY MOUTH DAILY What changed: when to take this   fish oil-omega-3 fatty acids 1000 MG capsule Take 2,000 tablets by mouth daily.   gabapentin 300 MG  capsule Commonly known as: NEURONTIN TAKE ONE CAPSULE BY MOUTH EVERY MORNING AND TAKE TWO CAPSULES BY MOUTH AT BEDTIME What changed:   how much to take  how to take this  when to take this  additional instructions   Glucosamine Chondroitin Complx Tabs Take 2 tablets by mouth daily.   losartan-hydrochlorothiazide 100-12.5 MG tablet Commonly known as: HYZAAR Take 1 tablet by mouth daily. What changed: when to take this   Multiple Vitamin tablet Take 1 tablet by mouth daily.   oseltamivir 75 MG capsule Commonly known as: TAMIFLU Take 1 capsule (75 mg total) by mouth 2 (two) times daily.   oxyCODONE-acetaminophen 5-325 MG tablet Commonly known as: PERCOCET/ROXICET Take 1-2 tablets by mouth every 4 (four) hours as needed for moderate pain.   pravastatin 20 MG tablet Commonly known as: PRAVACHOL Take 20 mg by mouth at bedtime.   Probiotic Daily Caps Take 1 tablet by mouth daily.   tamsulosin 0.4 MG Caps capsule Commonly known as: FLOMAX TAKE ONE CAPSULE BY MOUTH DAILY What changed: when to take this   Turmeric (Curcuma Longa) Powd Take 1 tablet by mouth daily.   vitamin C 500 MG tablet Commonly known as: ASCORBIC ACID Take 500 mg by mouth daily.   vitamin E 180 MG (400 UNITS) capsule Take 400 Units by mouth daily.        Signed: Ophelia Charter 02/16/2020, 7:51 AM

## 2020-02-25 DIAGNOSIS — Z5321 Procedure and treatment not carried out due to patient leaving prior to being seen by health care provider: Secondary | ICD-10-CM | POA: Insufficient documentation

## 2020-02-25 DIAGNOSIS — R509 Fever, unspecified: Secondary | ICD-10-CM | POA: Insufficient documentation

## 2020-02-26 ENCOUNTER — Emergency Department (HOSPITAL_COMMUNITY)
Admission: EM | Admit: 2020-02-26 | Discharge: 2020-02-26 | Disposition: A | Discharge status: Home / Self Care | Payer: BC Managed Care – PPO | Attending: Emergency Medicine | Admitting: Emergency Medicine

## 2020-02-26 ENCOUNTER — Other Ambulatory Visit: Payer: Self-pay

## 2020-02-26 LAB — COMPREHENSIVE METABOLIC PANEL
ALT: 36 U/L (ref 0–44)
AST: 24 U/L (ref 15–41)
Albumin: 3 g/dL — ABNORMAL LOW (ref 3.5–5.0)
Alkaline Phosphatase: 62 U/L (ref 38–126)
Anion gap: 19 — ABNORMAL HIGH (ref 5–15)
BUN: 18 mg/dL (ref 6–20)
CO2: 25 mmol/L (ref 22–32)
Calcium: 10.1 mg/dL (ref 8.9–10.3)
Chloride: 94 mmol/L — ABNORMAL LOW (ref 98–111)
Creatinine, Ser: 0.93 mg/dL (ref 0.61–1.24)
GFR, Estimated: >60 mL/min (ref >60)
Glucose, Bld: 118 mg/dL — ABNORMAL HIGH (ref 70–99)
Potassium: 3.9 mmol/L (ref 3.5–5.1)
Sodium: 138 mmol/L (ref 135–145)
Total Bilirubin: 0.3 mg/dL (ref 0.3–1.2)
Total Protein: 6.5 g/dL (ref 6.5–8.1)

## 2020-02-26 LAB — CBC WITH DIFFERENTIAL/PLATELET
Abs Immature Granulocytes: 0.06 10*3/uL (ref 0.00–0.07)
Basophils Absolute: 0.1 10*3/uL (ref 0.0–0.1)
Basophils Relative: 1 %
Eosinophils Absolute: 0.1 10*3/uL (ref 0.0–0.5)
Eosinophils Relative: 1 %
HCT: 36.5 % — ABNORMAL LOW (ref 39.0–52.0)
Hemoglobin: 11.5 g/dL — ABNORMAL LOW (ref 13.0–17.0)
Immature Granulocytes: 1 %
Lymphocytes Relative: 15 %
Lymphs Abs: 1.8 10*3/uL (ref 0.7–4.0)
MCH: 27.7 pg (ref 26.0–34.0)
MCHC: 31.5 g/dL (ref 30.0–36.0)
MCV: 88 fL (ref 80.0–100.0)
Monocytes Absolute: 0.9 10*3/uL (ref 0.1–1.0)
Monocytes Relative: 8 %
Neutro Abs: 8.7 10*3/uL — ABNORMAL HIGH (ref 1.7–7.7)
Neutrophils Relative %: 74 %
Platelets: 409 10*3/uL — ABNORMAL HIGH (ref 150–400)
RBC: 4.15 MIL/uL — ABNORMAL LOW (ref 4.22–5.81)
RDW: 12.3 % (ref 11.5–15.5)
WBC: 11.5 10*3/uL — ABNORMAL HIGH (ref 4.0–10.5)
nRBC: 0 % (ref 0.0–0.2)

## 2020-02-26 LAB — LACTIC ACID, PLASMA: Lactic Acid, Venous: 1.3 mmol/L (ref 0.5–1.9)

## 2020-02-26 NOTE — ED Notes (Signed)
Spoke with patient and her husband about staying in the waiting room to be seen. Pt reports he really would like to leave. Pt reports " I cant wait any longer". Pt was given an update on his lab work. Vital signs were updated. Pt states " I can see my doctor at 9am in the morning." Spoke with wife and she states she would like to leave . Pt and wife made aware of the seriousness of her husband staying to be seen. Pt and wife expresses that they understand. Pt reports he will come back if he develops a fever or if his incision site continues to bleed. Pt dressing changed before he left and at that time only a small amount of fluid was noted coming from his incision site.

## 2020-02-26 NOTE — ED Triage Notes (Signed)
Pt had surgery on 12/15 (back) and noticed today that he was running a temperature today and his wound is leaking and red around the incision. Pt states that he has had to change clothes twice.

## 2020-03-15 ENCOUNTER — Other Ambulatory Visit: Payer: Self-pay | Admitting: Family Medicine

## 2020-03-15 DIAGNOSIS — N401 Enlarged prostate with lower urinary tract symptoms: Secondary | ICD-10-CM

## 2020-05-22 DIAGNOSIS — G4733 Obstructive sleep apnea (adult) (pediatric): Secondary | ICD-10-CM | POA: Diagnosis not present

## 2020-05-23 ENCOUNTER — Encounter: Payer: Self-pay | Admitting: Dermatology

## 2020-05-23 ENCOUNTER — Other Ambulatory Visit: Payer: Self-pay | Admitting: Family Medicine

## 2020-05-23 ENCOUNTER — Other Ambulatory Visit: Payer: Self-pay

## 2020-05-23 ENCOUNTER — Ambulatory Visit: Payer: BC Managed Care – PPO | Admitting: Dermatology

## 2020-05-23 DIAGNOSIS — Z85828 Personal history of other malignant neoplasm of skin: Secondary | ICD-10-CM | POA: Diagnosis not present

## 2020-05-23 DIAGNOSIS — G8929 Other chronic pain: Secondary | ICD-10-CM

## 2020-05-23 DIAGNOSIS — L820 Inflamed seborrheic keratosis: Secondary | ICD-10-CM

## 2020-05-23 DIAGNOSIS — L821 Other seborrheic keratosis: Secondary | ICD-10-CM

## 2020-05-23 DIAGNOSIS — L719 Rosacea, unspecified: Secondary | ICD-10-CM | POA: Diagnosis not present

## 2020-05-23 DIAGNOSIS — L578 Other skin changes due to chronic exposure to nonionizing radiation: Secondary | ICD-10-CM

## 2020-05-23 DIAGNOSIS — D229 Melanocytic nevi, unspecified: Secondary | ICD-10-CM

## 2020-05-23 DIAGNOSIS — L720 Epidermal cyst: Secondary | ICD-10-CM | POA: Diagnosis not present

## 2020-05-23 DIAGNOSIS — Z1283 Encounter for screening for malignant neoplasm of skin: Secondary | ICD-10-CM | POA: Diagnosis not present

## 2020-05-23 DIAGNOSIS — L814 Other melanin hyperpigmentation: Secondary | ICD-10-CM

## 2020-05-23 DIAGNOSIS — D1800 Hemangioma unspecified site: Secondary | ICD-10-CM

## 2020-05-23 NOTE — Telephone Encounter (Signed)
Requested Prescriptions  Pending Prescriptions Disp Refills  . gabapentin (NEURONTIN) 300 MG capsule [Pharmacy Med Name: GABAPENTIN 300 MG CAPSULE] 270 capsule 3    Sig: TAKE ONE CAPSULE BY MOUTH EVERY MORNING AND TAKE TWO CAPSULES BY MOUTH AT BEDTIME     Neurology: Anticonvulsants - gabapentin Passed - 05/23/2020  6:20 AM      Passed - Valid encounter within last 12 months    Recent Outpatient Visits          3 months ago Spinal stenosis of lumbar region with neurogenic claudication   [facility] Jerrol Banana., MD   8 months ago Neuropathy   [facility] Jerrol Banana., MD   12 months ago Annual physical exam   [facility] Jerrol Banana., MD   1 year ago Essential (primary) hypertension   [facility] Jerrol Banana., MD   2 years ago [place] Chrismon, Vickki Muff, PA-C      Future Appointments            In 2 months Jerrol Banana., MD [facility], [place]

## 2020-05-23 NOTE — Progress Notes (Signed)
Follow-Up Visit   Subjective  Terry Horn. is a 60 y.o. male who presents for the following: Total body skin exam (Hx of BCC L scapula). The patient presents for Total-Body Skin Exam (TBSE) for skin cancer screening and mole check.  The following portions of the chart were reviewed this encounter and updated as appropriate:   Tobacco  Allergies  Meds  Problems  Med Hx  Surg Hx  Fam Hx     Review of Systems:  No other skin or systemic complaints except as noted in HPI or Assessment and Plan.  Objective  Well appearing patient in no apparent distress; mood and affect are within normal limits.  A full examination was performed including scalp, head, eyes, ears, nose, lips, neck, chest, axillae, abdomen, back, buttocks, bilateral upper extremities, bilateral lower extremities, hands, feet, fingers, toes, fingernails, and toenails. All findings within normal limits unless otherwise noted below.  Objective  L scapula: Well healed scar with no evidence of recurrence.   Objective  forehead: Smooth white papule(s).   Objective  Head - Anterior (Face): Erythema cheeks  Objective  Left cheek x 1, back x 1 (2): Erythematous keratotic or waxy stuck-on papule or plaque.    Assessment & Plan    Lentigines - Scattered tan macules - Due to sun exposure - Benign-appering, observe - Recommend daily broad spectrum sunscreen SPF 30+ to sun-exposed areas, reapply every 2 hours as needed. - Call for any changes  Seborrheic Keratoses - Stuck-on, waxy, tan-brown papules and plaques  - Discussed benign etiology and prognosis. - Observe - Call for any changes  Melanocytic Nevi - Tan-brown and/or pink-flesh-colored symmetric macules and papules - Benign appearing on exam today - Observation - Call clinic for new or changing moles - Recommend daily use of broad spectrum spf 30+ sunscreen to sun-exposed areas.   Hemangiomas - Red papules - Discussed benign nature -  Observe - Call for any changes  Actinic Damage - Chronic, secondary to cumulative UV/sun exposure - diffuse scaly erythematous macules with underlying dyspigmentation - Recommend daily broad spectrum sunscreen SPF 30+ to sun-exposed areas, reapply every 2 hours as needed.  - Call for new or changing lesions.  Skin cancer screening performed today.  History of basal cell carcinoma (BCC) L scapula Clear. Observe for recurrence. Call clinic for new or changing lesions.  Recommend regular skin exams, daily broad-spectrum spf 30+ sunscreen use, and photoprotection.     Milia forehead Benign, observe.    Rosacea Head - Anterior (Face Rosacea is a chronic progressive skin condition usually affecting the face of adults, causing redness and/or acne bumps. It is treatable but not curable. It sometimes affects the eyes (ocular rosacea) as well. It may respond to topical and/or systemic medication and can flare with stress, sun exposure, alcohol, exercise and some foods.  Daily application of broad spectrum spf 30+ sunscreen to face is recommended to reduce flares.  Mild, no treatment at this time  Inflamed seborrheic keratosis (2) Left cheek x 1, back x 1 Destruction of lesion - Left cheek x 1, back x 1 Complexity: simple   Destruction method: cryotherapy   Informed consent: discussed and consent obtained   Timeout:  patient name, date of birth, surgical site, and procedure verified Lesion destroyed using liquid nitrogen: Yes   Region frozen until ice ball extended beyond lesion: Yes   Outcome: patient tolerated procedure well with no complications   Post-procedure details: wound care instructions given    Return  in about 1 year (around 05/23/2021) for TBSE, Hx of BCC.   I, Othelia Pulling, RMA, am acting as scribe for Sarina Ser, MD .  Documentation: I have reviewed the above documentation for accuracy and completeness, and I agree with the above.  Sarina Ser, MD

## 2020-05-23 NOTE — Patient Instructions (Addendum)

## 2020-05-28 ENCOUNTER — Encounter: Payer: Self-pay | Admitting: Family Medicine

## 2020-05-28 ENCOUNTER — Encounter: Payer: Self-pay | Admitting: Dermatology

## 2020-06-09 ENCOUNTER — Other Ambulatory Visit: Payer: Self-pay | Admitting: Family Medicine

## 2020-06-09 DIAGNOSIS — R351 Nocturia: Secondary | ICD-10-CM

## 2020-06-09 DIAGNOSIS — N401 Enlarged prostate with lower urinary tract symptoms: Secondary | ICD-10-CM

## 2020-06-09 DIAGNOSIS — R350 Frequency of micturition: Secondary | ICD-10-CM

## 2020-06-09 NOTE — Telephone Encounter (Signed)
Requested Prescriptions  Pending Prescriptions Disp Refills  . finasteride (PROSCAR) 5 MG tablet [Pharmacy Med Name: FINASTERIDE 5 MG TABLET] 90 tablet 1    Sig: TAKE ONE TABLET BY MOUTH DAILY     Urology: 5-alpha Reductase Inhibitors Passed - 06/09/2020  3:22 PM      Passed - Valid encounter within last 12 months    Recent Outpatient Visits          4 months ago Spinal stenosis of lumbar region with neurogenic claudication   [facility] Jerrol Banana., MD   9 months ago Neuropathy   [facility] Jerrol Banana., MD   1 year ago Annual physical exam   [facility] Jerrol Banana., MD   1 year ago Essential (primary) hypertension   [facility] Jerrol Banana., MD   2 years ago [place] Chrismon, Vickki Muff, PA-C      Future Appointments            In 1 month Jerrol Banana., MD [facility] [place]           . tamsulosin (FLOMAX) 0.4 MG CAPS capsule [Pharmacy Med Name: TAMSULOSIN HCL 0.4 MG CAPSULE] 90 capsule 0    Sig: TAKE ONE CAPSULE BY MOUTH DAILY     Urology: Alpha-Adrenergic Blocker Passed - 06/09/2020  3:22 PM      Passed - Last BP in normal range    BP Readings from Last 1 Encounters:  02/26/20 113/67         Passed - Valid encounter within last 12 months    Recent Outpatient Visits          4 months ago Spinal stenosis of lumbar region with neurogenic claudication   [facility] Jerrol Banana., MD   9 months ago Neuropathy   [facility] Jerrol Banana., MD   1 year ago Annual physical exam   [facility] Jerrol Banana., MD   1 year ago Essential (primary) hypertension   [facility] Jerrol Banana., MD   2 years ago [place] Chrismon, Vickki Muff, PA-C      Future Appointments            In 1 month Jerrol Banana., MD [facility], [place]

## 2020-06-25 DIAGNOSIS — Z981 Arthrodesis status: Secondary | ICD-10-CM | POA: Diagnosis not present

## 2020-06-25 DIAGNOSIS — M4316 Spondylolisthesis, lumbar region: Secondary | ICD-10-CM | POA: Diagnosis not present

## 2020-08-06 ENCOUNTER — Ambulatory Visit (INDEPENDENT_AMBULATORY_CARE_PROVIDER_SITE_OTHER): Payer: BC Managed Care – PPO | Admitting: Family Medicine

## 2020-08-06 ENCOUNTER — Other Ambulatory Visit: Payer: Self-pay

## 2020-08-06 ENCOUNTER — Encounter: Payer: Self-pay | Admitting: Family Medicine

## 2020-08-06 VITALS — BP 126/76 | HR 61 | Temp 98.2°F | Resp 16 | Ht 71.0 in | Wt 333.0 lb

## 2020-08-06 DIAGNOSIS — M5136 Other intervertebral disc degeneration, lumbar region: Secondary | ICD-10-CM

## 2020-08-06 DIAGNOSIS — I10 Essential (primary) hypertension: Secondary | ICD-10-CM | POA: Diagnosis not present

## 2020-08-06 DIAGNOSIS — Z0000 Encounter for general adult medical examination without abnormal findings: Secondary | ICD-10-CM | POA: Diagnosis not present

## 2020-08-06 DIAGNOSIS — Z125 Encounter for screening for malignant neoplasm of prostate: Secondary | ICD-10-CM

## 2020-08-06 DIAGNOSIS — R739 Hyperglycemia, unspecified: Secondary | ICD-10-CM

## 2020-08-06 DIAGNOSIS — Z1389 Encounter for screening for other disorder: Secondary | ICD-10-CM | POA: Diagnosis not present

## 2020-08-06 DIAGNOSIS — E782 Mixed hyperlipidemia: Secondary | ICD-10-CM

## 2020-08-06 DIAGNOSIS — M51369 Other intervertebral disc degeneration, lumbar region without mention of lumbar back pain or lower extremity pain: Secondary | ICD-10-CM

## 2020-08-06 DIAGNOSIS — Z6841 Body Mass Index (BMI) 40.0 and over, adult: Secondary | ICD-10-CM

## 2020-08-06 LAB — POCT URINALYSIS DIPSTICK
Bilirubin, UA: NEGATIVE
Blood, UA: NEGATIVE
Glucose, UA: NEGATIVE
Ketones, UA: NEGATIVE
Leukocytes, UA: NEGATIVE
Nitrite, UA: NEGATIVE
Protein, UA: NEGATIVE
Spec Grav, UA: 1.015 (ref 1.010–1.025)
Urobilinogen, UA: 0.2 E.U./dL
pH, UA: 6 (ref 5.0–8.0)

## 2020-08-06 NOTE — Progress Notes (Signed)
Complete physical exam   Patient: Terry Horn.   DOB: [birth year]   60 y.o. Male  MRN: [id number] Visit Date: 08/06/2020  Today's healthcare provider: Wilhemena Durie, MD   Chief Complaint  Patient presents with   Annual Exam   Subjective    Terry Glassco. is a 60 y.o. male who presents today for a complete physical exam.  He reports consuming a general diet. The patient does not participate in regular exercise at present. He generally feels well. He reports sleeping well. He does not have additional problems to discuss today.  He is doing well but is having a lot of stress with both parents in failing health and both in-laws in failing health and his son with significant mental health issues.  Past Medical History:  Diagnosis Date   Arthritis    joints,hands,elbow   Basal cell carcinoma    L scapula   Cancer (HCC)    skin cancer removed   GERD (gastroesophageal reflux disease)    Hypertension    Pneumonia    Sleep apnea    with cpap   Past Surgical History:  Procedure Laterality Date   EXCISION OF SKIN TAG N/A 02/06/2016   Procedure: EXCISION OF SKIN TAG;  Surgeon: [name], MD;  Location: [facility] ORS;  Service: General;  Laterality: N/A;   TONSILLECTOMY     Social History   Socioeconomic History   Marital status: Married    Spouse name: Not on file   Number of children: Not on file   Years of education: Not on file   Highest education level: Not on file  Occupational History   Not on file  Tobacco Use   Smoking status: Former Smoker    Years: 22.00    Quit date: 09/05/1996    Years since quitting: 23.9   Smokeless tobacco: Never Used   Tobacco comment: Quit in 1998  Substance and Sexual Activity   Alcohol use: Yes    Comment: occasionally   Drug use: No   Sexual activity: Not on file  Other Topics Concern   Not on file  Social History Narrative   Not on file   Social Determinants of Health   Financial Resource Strain: Not  on file  Food Insecurity: Not on file  Transportation Needs: Not on file  Physical Activity: Not on file  Stress: Not on file  Social Connections: Not on file  Intimate Partner Violence: Not on file   Family Status  Relation Name Status   Mother  Alive   Sister  Alive   PGF  Deceased   Father  Alive   Son  Alive   Son  Alive   Psychiatrist  (Not Specified)   Family History  Problem Relation Age of Onset   Bipolar disorder Mother    Thyroid disease Mother    Depression Sister    Prostate cancer Paternal Grandfather    Prostate cancer Paternal Uncle    No Known Allergies  Patient Care Team: Jerrol Banana., MD as PCP - General (Family Medicine) Jerrol Banana., MD (Family Medicine) Bary Castilla Forest Gleason, MD (General Surgery)   Medications: Outpatient Medications Prior to Visit  Medication Sig   aspirin 81 MG tablet Take 81 mg by mouth daily.    cetirizine-pseudoephedrine (ZYRTEC-D) 5-120 MG tablet Take 1 tablet by mouth daily.    Cholecalciferol 1000 UNITS tablet Take 1,000 Units by mouth daily.  Dextran 70-Hypromellose (ARTIFICIAL TEARS) 0.1-0.3 % SOLN Place 1 drop into both eyes daily as needed (Dry eye).   finasteride (PROSCAR) 5 MG tablet TAKE ONE TABLET BY MOUTH DAILY   fish oil-omega-3 fatty acids 1000 MG capsule Take 2,000 tablets by mouth daily.    gabapentin (NEURONTIN) 300 MG capsule TAKE ONE CAPSULE BY MOUTH EVERY MORNING AND TAKE TWO CAPSULES BY MOUTH AT BEDTIME   losartan-hydrochlorothiazide (HYZAAR) 100-12.5 MG tablet Take 1 tablet by mouth daily. (Patient taking differently: Take 1 tablet by mouth daily with lunch.)   Misc Natural Products (GLUCOSAMINE CHONDROITIN COMPLX) TABS Take 2 tablets by mouth daily.    Multiple Vitamin tablet Take 1 tablet by mouth daily.    Probiotic Product (PROBIOTIC DAILY) CAPS Take 1 tablet by mouth daily.   tamsulosin (FLOMAX) 0.4 MG CAPS capsule TAKE ONE CAPSULE BY MOUTH DAILY   Turmeric, Curcuma Longa, POWD Take 1  tablet by mouth daily.    vitamin C (ASCORBIC ACID) 500 MG tablet Take 500 mg by mouth daily.    vitamin E 400 UNIT capsule Take 400 Units by mouth daily.    Azelastine-Fluticasone (DYMISTA) 137-50 MCG/ACT SUSP Place 2 sprays into the nose daily. (Patient not taking: Reported on 08/06/2020)   cyclobenzaprine (FLEXERIL) 10 MG tablet Take 1 tablet (10 mg total) by mouth 3 (three) times daily as needed for muscle spasms. (Patient not taking: Reported on 08/06/2020)   docusate sodium (COLACE) 100 MG capsule Take 1 capsule (100 mg total) by mouth 2 (two) times daily. (Patient not taking: Reported on 08/06/2020)   oseltamivir (TAMIFLU) 75 MG capsule Take 1 capsule (75 mg total) by mouth 2 (two) times daily.   oxyCODONE-acetaminophen (PERCOCET/ROXICET) 5-325 MG tablet Take 1-2 tablets by mouth every 4 (four) hours as needed for moderate pain.   pravastatin (PRAVACHOL) 20 MG tablet Take 20 mg by mouth at bedtime.    No facility-administered medications prior to visit.    Review of Systems  All other systems reviewed and are negative.      Objective    BP 126/76   Pulse 61   Temp 98.2 F (36.8 C)   Resp 16   Ht 5\' 11"  (1.803 m)   Wt (!) 333 lb (151 kg)   BMI 46.44 kg/m  BP Readings from Last 3 Encounters:  08/06/20 126/76  02/26/20 113/67  02/16/20 (!) 111/50   Wt Readings from Last 3 Encounters:  08/06/20 (!) 333 lb (151 kg)  02/26/20 (!) 328 lb (148.8 kg)  02/14/20 (!) 323 lb 10.2 oz (146.8 kg)      Physical Exam Vitals reviewed.  Constitutional:      Appearance: He is obese.  HENT:     Head: Normocephalic and atraumatic.     Right Ear: Tympanic membrane, ear canal and external ear normal.     Left Ear: Tympanic membrane, ear canal and external ear normal.     Nose: Nose normal.     Mouth/Throat:     Mouth: Mucous membranes are moist.     Pharynx: Oropharynx is clear.  Eyes:     Extraocular Movements: Extraocular movements intact.     Conjunctiva/sclera: Conjunctivae normal.      Pupils: Pupils are equal, round, and reactive to light.  Cardiovascular:     Rate and Rhythm: Normal rate and regular rhythm.     Pulses: Normal pulses.     Heart sounds: Normal heart sounds.  Pulmonary:     Effort: Pulmonary effort is normal.  Breath sounds: Normal breath sounds.  Abdominal:     General: Abdomen is flat.     Palpations: Abdomen is soft.  Genitourinary:    Penis: Normal.      Testes: Normal.  Musculoskeletal:     Comments: Mild lymphedema.  Skin:    General: Skin is warm and dry.  Neurological:     General: No focal deficit present.     Mental Status: He is alert and oriented to person, place, and time.  Psychiatric:        Mood and Affect: Mood normal.        Behavior: Behavior normal.        Thought Content: Thought content normal.        Judgment: Judgment normal.      Last depression screening scores PHQ 2/9 Scores 08/06/2020 01/29/2020 05/25/2019  PHQ - 2 Score 0 0 0  PHQ- 9 Score 0 0 1   Last fall risk screening Fall Risk  08/06/2020  Falls in the past year? 0  Number falls in past yr: 0  Injury with Fall? 0  Risk for fall due to : No Fall Risks  Follow up Falls evaluation completed   Last Audit-C alcohol use screening Alcohol Use Disorder Test (AUDIT) 08/06/2020  1. How often do you have a drink containing alcohol? 0  2. How many drinks containing alcohol do you have on a typical day when you are drinking? 0  3. How often do you have six or more drinks on one occasion? 0  AUDIT-C Score 0  Alcohol Brief Interventions/Follow-up -   A score of 3 or more in women, and 4 or more in men indicates increased risk for alcohol abuse, EXCEPT if all of the points are from question 1   No results found for any visits on 08/06/20.  Assessment & Plan    Routine Health Maintenance and Physical Exam  Exercise Activities and Dietary recommendations Goals   None     Immunization History  Administered Date(s) Administered   Influenza,inj,Quad  PF,6+ Mos 11/29/2017, 12/05/2018   Pneumococcal Polysaccharide-23 12/21/1991   Tdap 10/26/2007   Zoster Recombinat (Shingrix) 05/14/2018, 09/09/2018    Health Maintenance  Topic Date Due   Pneumococcal Vaccine 24-27 Years old (27 of 4 - PCV13) Never done   COVID-19 Vaccine (1) Never done   HIV Screening  Never done   Hepatitis C Screening  Never done   TETANUS/TDAP  10/25/2017   INFLUENZA VACCINE  09/30/2020   COLONOSCOPY (Pts 45-4yrs Insurance coverage will need to be confirmed)  02/05/2026   Zoster Vaccines- Shingrix  Completed   HPV VACCINES  Aged Out    Discussed health benefits of physical activity, and encouraged him to engage in regular exercise appropriate for his age and condition.  1. Annual physical exam - Lipid panel - TSH - CBC w/Diff/Platelet - Comprehensive Metabolic Panel (CMET) - Hemoglobin A1c  2. Essential (primary) hypertension  - Lipid panel - TSH - CBC w/Diff/Platelet - Comprehensive Metabolic Panel (CMET) - Hemoglobin A1c  3. Mixed hyperlipidemia - Lipid panel - TSH - CBC w/Diff/Platelet - Comprehensive Metabolic Panel (CMET) - Hemoglobin A1c  4. Hyperglycemia  - Lipid panel - TSH - CBC w/Diff/Platelet - Comprehensive Metabolic Panel (CMET) - Hemoglobin A1c  5. Screening for prostate cancer  - PSA  6. Screening for blood or protein in urine  - POCT urinalysis dipstick   No follow-ups on file.        Dom Haverland Rosanna Randy  [place], [place] [phone] (phone) [phone] (fax)  [place]

## 2020-08-07 LAB — CBC WITH DIFFERENTIAL/PLATELET
Basophils Absolute: 0.1 10*3/uL (ref 0.0–0.2)
Basos: 1 %
EOS (ABSOLUTE): 0.1 10*3/uL (ref 0.0–0.4)
Eos: 2 %
Hematocrit: 41.8 % (ref 37.5–51.0)
Hemoglobin: 13.8 g/dL (ref 13.0–17.7)
Immature Grans (Abs): 0 10*3/uL (ref 0.0–0.1)
Immature Granulocytes: 0 %
Lymphocytes Absolute: 1.8 10*3/uL (ref 0.7–3.1)
Lymphs: 34 %
MCH: 27.5 pg (ref 26.6–33.0)
MCHC: 33 g/dL (ref 31.5–35.7)
MCV: 83 fL (ref 79–97)
Monocytes Absolute: 0.5 10*3/uL (ref 0.1–0.9)
Monocytes: 10 %
Neutrophils Absolute: 2.8 10*3/uL (ref 1.4–7.0)
Neutrophils: 53 %
Platelets: 216 10*3/uL (ref 150–450)
RBC: 5.02 x10E6/uL (ref 4.14–5.80)
RDW: 14 % (ref 11.6–15.4)
WBC: 5.3 10*3/uL (ref 3.4–10.8)

## 2020-08-07 LAB — LIPID PANEL
Chol/HDL Ratio: 2.7 ratio (ref 0.0–5.0)
Cholesterol, Total: 177 mg/dL (ref 100–199)
HDL: 65 mg/dL (ref >39)
LDL Chol Calc (NIH): 100 mg/dL — ABNORMAL HIGH (ref 0–99)
Triglycerides: 63 mg/dL (ref 0–149)
VLDL Cholesterol Cal: 12 mg/dL (ref 5–40)

## 2020-08-07 LAB — COMPREHENSIVE METABOLIC PANEL
ALT: 24 IU/L (ref 0–44)
AST: 25 IU/L (ref 0–40)
Albumin/Globulin Ratio: 1.9 (ref 1.2–2.2)
Albumin: 4.5 g/dL (ref 3.8–4.9)
Alkaline Phosphatase: 82 IU/L (ref 44–121)
BUN/Creatinine Ratio: 18 (ref 9–20)
BUN: 12 mg/dL (ref 6–24)
Bilirubin Total: 0.5 mg/dL (ref 0.0–1.2)
CO2: 26 mmol/L (ref 20–29)
Calcium: 9 mg/dL (ref 8.7–10.2)
Chloride: 102 mmol/L (ref 96–106)
Creatinine, Ser: 0.67 mg/dL — ABNORMAL LOW (ref 0.76–1.27)
Globulin, Total: 2.4 g/dL (ref 1.5–4.5)
Glucose: 99 mg/dL (ref 65–99)
Potassium: 4.4 mmol/L (ref 3.5–5.2)
Sodium: 143 mmol/L (ref 134–144)
Total Protein: 6.9 g/dL (ref 6.0–8.5)
eGFR: 108 mL/min/{1.73_m2} (ref >59)

## 2020-08-07 LAB — PSA: Prostate Specific Ag, Serum: <0.1 ng/mL (ref 0.0–4.0)

## 2020-08-07 LAB — HEMOGLOBIN A1C
Est. average glucose Bld gHb Est-mCnc: 123 mg/dL
Hgb A1c MFr Bld: 5.9 % — ABNORMAL HIGH (ref 4.8–5.6)

## 2020-08-07 LAB — TSH: TSH: 1.09 u[IU]/mL (ref 0.450–4.500)

## 2020-08-16 ENCOUNTER — Other Ambulatory Visit: Payer: Self-pay | Admitting: Family Medicine

## 2020-08-16 DIAGNOSIS — I10 Essential (primary) hypertension: Secondary | ICD-10-CM

## 2020-09-05 ENCOUNTER — Other Ambulatory Visit: Payer: Self-pay | Admitting: Family Medicine

## 2020-09-05 DIAGNOSIS — N401 Enlarged prostate with lower urinary tract symptoms: Secondary | ICD-10-CM

## 2020-10-10 DIAGNOSIS — I2510 Atherosclerotic heart disease of native coronary artery without angina pectoris: Secondary | ICD-10-CM | POA: Diagnosis present

## 2020-10-10 DIAGNOSIS — G4733 Obstructive sleep apnea (adult) (pediatric): Secondary | ICD-10-CM | POA: Diagnosis not present

## 2020-10-10 DIAGNOSIS — I10 Essential (primary) hypertension: Secondary | ICD-10-CM | POA: Diagnosis not present

## 2020-10-10 DIAGNOSIS — E782 Mixed hyperlipidemia: Secondary | ICD-10-CM | POA: Diagnosis not present

## 2020-10-14 DIAGNOSIS — G4733 Obstructive sleep apnea (adult) (pediatric): Secondary | ICD-10-CM | POA: Diagnosis not present

## 2020-11-20 ENCOUNTER — Encounter: Payer: Self-pay | Admitting: Family Medicine

## 2020-12-02 ENCOUNTER — Other Ambulatory Visit: Payer: Self-pay | Admitting: Family Medicine

## 2020-12-02 DIAGNOSIS — N401 Enlarged prostate with lower urinary tract symptoms: Secondary | ICD-10-CM

## 2020-12-03 ENCOUNTER — Other Ambulatory Visit: Payer: Self-pay | Admitting: Family Medicine

## 2020-12-03 DIAGNOSIS — R351 Nocturia: Secondary | ICD-10-CM

## 2021-01-06 ENCOUNTER — Ambulatory Visit: Payer: BC Managed Care – PPO | Admitting: Family Medicine

## 2021-01-06 ENCOUNTER — Other Ambulatory Visit: Payer: Self-pay

## 2021-01-06 ENCOUNTER — Encounter: Payer: Self-pay | Admitting: Family Medicine

## 2021-01-06 VITALS — BP 119/59 | HR 65 | Temp 98.4°F | Resp 16 | Ht 71.0 in | Wt 329.0 lb

## 2021-01-06 DIAGNOSIS — Z6841 Body Mass Index (BMI) 40.0 and over, adult: Secondary | ICD-10-CM

## 2021-01-06 DIAGNOSIS — I10 Essential (primary) hypertension: Secondary | ICD-10-CM | POA: Diagnosis not present

## 2021-01-06 DIAGNOSIS — M48062 Spinal stenosis, lumbar region with neurogenic claudication: Secondary | ICD-10-CM

## 2021-01-06 DIAGNOSIS — G4733 Obstructive sleep apnea (adult) (pediatric): Secondary | ICD-10-CM

## 2021-01-06 DIAGNOSIS — E559 Vitamin D deficiency, unspecified: Secondary | ICD-10-CM

## 2021-01-06 DIAGNOSIS — G8929 Other chronic pain: Secondary | ICD-10-CM

## 2021-01-06 DIAGNOSIS — M5442 Lumbago with sciatica, left side: Secondary | ICD-10-CM

## 2021-01-06 DIAGNOSIS — R739 Hyperglycemia, unspecified: Secondary | ICD-10-CM | POA: Diagnosis not present

## 2021-01-06 LAB — POCT GLYCOSYLATED HEMOGLOBIN (HGB A1C)
Est. average glucose Bld gHb Est-mCnc: 123
Hemoglobin A1C: 5.9 % — AB (ref 4.0–5.6)

## 2021-01-06 NOTE — Patient Instructions (Signed)
TRY OVER-THE-COUNTER GLYCOLAX DAILY.

## 2021-01-06 NOTE — Progress Notes (Signed)
I,April Miller,acting as a scribe for Wilhemena Durie, MD.,have documented all relevant documentation on the behalf of Wilhemena Durie, MD,as directed by  Wilhemena Durie, MD while in the presence of Wilhemena Durie, MD.   Established patient visit   Patient: Terry Horn.   DOB: 07/15/1960   60 y.o. Male  MRN: [id number] Visit Date: 01/06/2021  Today's healthcare provider: Wilhemena Durie, MD   Chief Complaint  Patient presents with   Follow-up   Hypertension   Prediabetes   Subjective    HPI  Patient comes in today for follow-up.  He is feeling better.  His back pain is somewhat improved since his surgery a year ago. His youngest son's been hospitalized with severe depression and OCD for the last 3 months.  Hypertension, follow-up  BP Readings from Last 3 Encounters:  01/06/21 (!) 119/59  08/06/20 126/76  02/26/20 113/67   Wt Readings from Last 3 Encounters:  01/06/21 (!) 329 lb (149.2 kg)  08/06/20 (!) 333 lb (151 kg)  02/26/20 (!) 328 lb (148.8 kg)     He was last seen for hypertension 5 months ago.  BP at that visit was 126/76. Management since that visit includes; losartan-hydrochlorothiazide He reports good compliance with treatment. He is not having side effects. none He is not exercising. He is not adherent to low salt diet.   Outside blood pressures are not checking.  He does not smoke.  Use of agents associated with hypertension: none.   --------------------------------------------------------------------------------------------------- Prediabetes, Follow-up  Lab Results  Component Value Date   HGBA1C 5.9 (A) 01/06/2021   HGBA1C 5.9 (H) 08/06/2020   HGBA1C 5.5 08/29/2019   GLUCOSE 99 08/06/2020   GLUCOSE 118 (H) 02/26/2020   GLUCOSE 140 (H) 02/15/2020    Last seen for for this5 months ago.  Management since that visit includes; labs checked-advised to worked on diet and exercise. Current symptoms include none and  have been unchanged.  Prior visit with dietician: no Current diet: well balanced Current exercise: none  Pertinent Labs:    Component Value Date/Time   CHOL 177 08/06/2020 1026   TRIG 63 08/06/2020 1026   CHOLHDL 2.7 08/06/2020 1026   CREATININE 0.67 (L) 08/06/2020 1026    Wt Readings from Last 3 Encounters:  01/06/21 (!) 329 lb (149.2 kg)  08/06/20 (!) 333 lb (151 kg)  02/26/20 (!) 328 lb (148.8 kg)    -----------------------------------------------------------------------------------------      Medications: Outpatient Medications Prior to Visit  Medication Sig   aspirin 81 MG tablet Take 81 mg by mouth daily.    Azelastine-Fluticasone (DYMISTA) 137-50 MCG/ACT SUSP Place 2 sprays into the nose daily.   cetirizine-pseudoephedrine (ZYRTEC-D) 5-120 MG tablet Take 1 tablet by mouth daily.    Cholecalciferol 1000 UNITS tablet Take 1,000 Units by mouth daily.    Dextran 70-Hypromellose (ARTIFICIAL TEARS) 0.1-0.3 % SOLN Place 1 drop into both eyes daily as needed (Dry eye).   finasteride (PROSCAR) 5 MG tablet TAKE ONE TABLET BY MOUTH DAILY   fish oil-omega-3 fatty acids 1000 MG capsule Take 2,000 tablets by mouth daily.    gabapentin (NEURONTIN) 300 MG capsule TAKE ONE CAPSULE BY MOUTH EVERY MORNING AND TAKE TWO CAPSULES BY MOUTH AT BEDTIME   losartan-hydrochlorothiazide (HYZAAR) 100-12.5 MG tablet TAKE ONE TABLET BY MOUTH DAILY   Misc Natural Products (GLUCOSAMINE CHONDROITIN COMPLX) TABS Take 2 tablets by mouth daily.    Multiple Vitamin tablet Take 1 tablet by mouth daily.  pravastatin (PRAVACHOL) 20 MG tablet Take 20 mg by mouth at bedtime.    Probiotic Product (PROBIOTIC DAILY) CAPS Take 1 tablet by mouth daily.   tamsulosin (FLOMAX) 0.4 MG CAPS capsule TAKE ONE CAPSULE BY MOUTH DAILY   Turmeric, Curcuma Longa, POWD Take 1 tablet by mouth daily.    vitamin C (ASCORBIC ACID) 500 MG tablet Take 500 mg by mouth daily.    vitamin E 400 UNIT capsule Take 400 Units by mouth  daily.    [DISCONTINUED] cyclobenzaprine (FLEXERIL) 10 MG tablet Take 1 tablet (10 mg total) by mouth 3 (three) times daily as needed for muscle spasms. (Patient not taking: No sig reported)   [DISCONTINUED] docusate sodium (COLACE) 100 MG capsule Take 1 capsule (100 mg total) by mouth 2 (two) times daily. (Patient not taking: No sig reported)   [DISCONTINUED] oseltamivir (TAMIFLU) 75 MG capsule Take 1 capsule (75 mg total) by mouth 2 (two) times daily. (Patient not taking: Reported on 01/06/2021)   [DISCONTINUED] oxyCODONE-acetaminophen (PERCOCET/ROXICET) 5-325 MG tablet Take 1-2 tablets by mouth every 4 (four) hours as needed for moderate pain. (Patient not taking: Reported on 01/06/2021)   No facility-administered medications prior to visit.    Review of Systems  Constitutional:  Negative for appetite change, chills and fever.  Respiratory:  Negative for chest tightness, shortness of breath and wheezing.   Cardiovascular:  Negative for chest pain and palpitations.  Gastrointestinal:  Negative for abdominal pain, nausea and vomiting.   Last hemoglobin A1c Lab Results  Component Value Date   HGBA1C 5.9 (A) 01/06/2021       Objective    BP (!) 119/59 (BP Location: Left Arm, Patient Position: Sitting, Cuff Size: Large)   Pulse 65   Temp 98.4 F (36.9 C) (Oral)   Resp 16   Ht 5\' 11"  (1.803 m)   Wt (!) 329 lb (149.2 kg)   SpO2 97%   BMI 45.89 kg/m  BP Readings from Last 3 Encounters:  01/06/21 (!) 119/59  08/06/20 126/76  02/26/20 113/67   Wt Readings from Last 3 Encounters:  01/06/21 (!) 329 lb (149.2 kg)  08/06/20 (!) 333 lb (151 kg)  02/26/20 (!) 328 lb (148.8 kg)      Physical Exam Vitals reviewed.  Constitutional:      Appearance: He is obese.  HENT:     Head: Normocephalic and atraumatic.     Right Ear: Tympanic membrane, ear canal and external ear normal.     Left Ear: Tympanic membrane, ear canal and external ear normal.     Nose: Nose normal.      Mouth/Throat:     Mouth: Mucous membranes are moist.     Pharynx: Oropharynx is clear.  Eyes:     Extraocular Movements: Extraocular movements intact.     Conjunctiva/sclera: Conjunctivae normal.     Pupils: Pupils are equal, round, and reactive to light.  Cardiovascular:     Rate and Rhythm: Normal rate and regular rhythm.     Pulses: Normal pulses.     Heart sounds: Normal heart sounds.  Pulmonary:     Effort: Pulmonary effort is normal.     Breath sounds: Normal breath sounds.  Abdominal:     General: Abdomen is flat.     Palpations: Abdomen is soft.  Musculoskeletal:     Comments: Mild lymphedema.  Skin:    General: Skin is warm and dry.  Neurological:     General: No focal deficit present.  Mental Status: He is alert and oriented to person, place, and time.  Psychiatric:        Mood and Affect: Mood normal.        Behavior: Behavior normal.        Thought Content: Thought content normal.        Judgment: Judgment normal.      Results for orders placed or performed in visit on 01/06/21  POCT glycosylated hemoglobin (Hb A1C)  Result Value Ref Range   Hemoglobin A1C 5.9 (A) 4.0 - 5.6 %   Est. average glucose Bld gHb Est-mCnc 123     Assessment & Plan      1. Essential (primary) hypertension Good control on [place] HCT  2. Hyperglycemia A1c stable at 5.9 with prediabetes - POCT glycosylated hemoglobin (Hb A1C)  3. Obstructive sleep apnea syndrome On CPAP  4. Class 3 severe obesity due to excess calories with serious comorbidity and body mass index (BMI) of 45.0 to 49.9 in adult [facility]) Stressed lifestyle going forward to help with prediabetes  5. Chronic low back pain (Primary Area of Pain) (Bilateral) w/ sciatica (Left)   6. Spinal stenosis of lumbar region with neurogenic claudication Status post spinal stenosis surgery a year ago  7. Avitaminosis D    Return in about 7 months (around 08/06/2021).      I, Wilhemena Durie, MD, have reviewed all  documentation for this visit. The documentation on 01/06/21 for the exam, diagnosis, procedures, and orders are all accurate and complete.    Jaritza Duignan Cranford Mon, MD  [facility] [phone] (phone) [phone] (fax)  [place]

## 2021-02-07 DIAGNOSIS — Z981 Arthrodesis status: Secondary | ICD-10-CM | POA: Diagnosis not present

## 2021-02-07 DIAGNOSIS — M4712 Other spondylosis with myelopathy, cervical region: Secondary | ICD-10-CM | POA: Diagnosis not present

## 2021-02-07 DIAGNOSIS — M4316 Spondylolisthesis, lumbar region: Secondary | ICD-10-CM | POA: Diagnosis not present

## 2021-02-07 DIAGNOSIS — I10 Essential (primary) hypertension: Secondary | ICD-10-CM | POA: Diagnosis not present

## 2021-02-12 ENCOUNTER — Other Ambulatory Visit: Payer: Self-pay | Admitting: Neurosurgery

## 2021-02-12 DIAGNOSIS — M4712 Other spondylosis with myelopathy, cervical region: Secondary | ICD-10-CM

## 2021-03-03 ENCOUNTER — Other Ambulatory Visit: Payer: Self-pay | Admitting: Family Medicine

## 2021-03-03 DIAGNOSIS — N401 Enlarged prostate with lower urinary tract symptoms: Secondary | ICD-10-CM

## 2021-03-11 ENCOUNTER — Ambulatory Visit: Payer: BC Managed Care – PPO

## 2021-04-10 ENCOUNTER — Encounter: Payer: Self-pay | Admitting: Family Medicine

## 2021-04-11 MED ORDER — DICLOFENAC SODIUM 50 MG PO TBEC: 50.0000 mg | DELAYED_RELEASE_TABLET | Freq: Two times a day (BID) | ORAL | 11 refills | Status: DC

## 2021-04-12 DIAGNOSIS — G4733 Obstructive sleep apnea (adult) (pediatric): Secondary | ICD-10-CM | POA: Diagnosis not present

## 2021-05-10 DIAGNOSIS — G4733 Obstructive sleep apnea (adult) (pediatric): Secondary | ICD-10-CM | POA: Diagnosis not present

## 2021-05-27 ENCOUNTER — Other Ambulatory Visit: Payer: Self-pay | Admitting: Family Medicine

## 2021-05-27 DIAGNOSIS — G8929 Other chronic pain: Secondary | ICD-10-CM

## 2021-05-29 ENCOUNTER — Ambulatory Visit: Payer: BC Managed Care – PPO | Admitting: Dermatology

## 2021-05-29 DIAGNOSIS — L578 Other skin changes due to chronic exposure to nonionizing radiation: Secondary | ICD-10-CM

## 2021-05-29 DIAGNOSIS — D229 Melanocytic nevi, unspecified: Secondary | ICD-10-CM

## 2021-05-29 DIAGNOSIS — Z85828 Personal history of other malignant neoplasm of skin: Secondary | ICD-10-CM | POA: Diagnosis not present

## 2021-05-29 DIAGNOSIS — L821 Other seborrheic keratosis: Secondary | ICD-10-CM

## 2021-05-29 DIAGNOSIS — L304 Erythema intertrigo: Secondary | ICD-10-CM | POA: Diagnosis not present

## 2021-05-29 DIAGNOSIS — D1800 Hemangioma unspecified site: Secondary | ICD-10-CM

## 2021-05-29 DIAGNOSIS — Z1283 Encounter for screening for malignant neoplasm of skin: Secondary | ICD-10-CM | POA: Diagnosis not present

## 2021-05-29 DIAGNOSIS — L820 Inflamed seborrheic keratosis: Secondary | ICD-10-CM

## 2021-05-29 DIAGNOSIS — L814 Other melanin hyperpigmentation: Secondary | ICD-10-CM

## 2021-05-29 DIAGNOSIS — L219 Seborrheic dermatitis, unspecified: Secondary | ICD-10-CM

## 2021-05-29 MED ORDER — KETOCONAZOLE 2 % EX CREA: 1.0000 "application " | TOPICAL_CREAM | CUTANEOUS | 11 refills | Status: AC

## 2021-05-29 MED ORDER — HYDROCORTISONE 2.5 % EX CREA: TOPICAL_CREAM | CUTANEOUS | 6 refills | Status: AC

## 2021-05-29 NOTE — Patient Instructions (Addendum)
Seborrheic Dermatitis  ?-  is a chronic persistent rash characterized by pinkness and scaling most commonly of the mid face but also can occur on the scalp (dandruff), ears; mid chest, mid back and groin.  It tends to be exacerbated by stress and cooler weather.  People who have neurologic disease may experience new onset or exacerbation of existing seborrheic dermatitis.  The condition is not curable but treatable and can be controlled.  ? ?Start Ketoconazole 2% cream 3 times weekly Monday, Wednesday, Friday to scaly areas face ?Start HC 2.5% cream 3 times weekly Tuesday, Thursday, Saturday to scaly areas face ? ? ? ? ? ? ?If You Need Anything After Your Visit ? ?If you have any questions or concerns for your doctor, please call our main line at [phone] and press option 4 to reach your doctor's medical assistant. If no one answers, please leave a voicemail as directed and we will return your call as soon as possible. Messages left after 4 pm will be answered the following business day.  ? ?You may also send [place] a message via MyChart. We typically respond to MyChart messages within 1-2 business days. ? ?For prescription refills, please ask your pharmacy to contact our office. Our fax number is [phone]. ? ?If you have an urgent issue when the clinic is closed that cannot wait until the next business day, you can page your doctor at the number below.   ? ?Please note that while we do our best to be available for urgent issues outside of office hours, we are not available 24/7.  ? ?If you have an urgent issue and are unable to reach [place], you may choose to seek medical care at your doctor's office, retail clinic, urgent care center, or emergency room. ? ?If you have a medical emergency, please immediately call 911 or go to the emergency department. ? ?Pager Numbers ? ?- Dr. Nehemiah Massed: [phone] ? ?- Dr. Laurence Ferrari: [phone] ? ?- Dr. Nicole Kindred: [phone] ? ?In the event of inclement weather, please call our main  line at [phone] for an update on the status of any delays or closures. ? ?Dermatology Medication Tips: ?Please keep the boxes that topical medications come in in order to help keep track of the instructions about where and how to use these. Pharmacies typically print the medication instructions only on the boxes and not directly on the medication tubes.  ? ?If your medication is too expensive, please contact our office at [phone] option 4 or send [place] a message through [place].  ? ?We are unable to tell what your co-pay for medications will be in advance as this is different depending on your insurance coverage. However, we may be able to find a substitute medication at lower cost or fill out paperwork to get insurance to cover a needed medication.  ? ?If a prior authorization is required to get your medication covered by your insurance company, please allow [place] 1-2 business days to complete this process. ? ?Drug prices often vary depending on where the prescription is filled and some pharmacies may offer cheaper prices. ? ?The website www.goodrx.com contains coupons for medications through different pharmacies. The prices here do not account for what the cost may be with help from insurance (it may be cheaper with your insurance), but the website can give you the price if you did not use any insurance.  ?- You can print the associated coupon and take it with your prescription to the pharmacy.  ?- You may  also stop by our office during regular business hours and pick up a GoodRx coupon card.  ?- If you need your prescription sent electronically to a different pharmacy, notify our office through [facility] or by phone at [phone] option 4. ? ? ? ? ?Si Usted Necesita Algo Despu?s de Su Visita ? ?Tambi?n puede enviarnos un mensaje a trav?s de MyChart. Por lo general respondemos a los mensajes de MyChart en el transcurso de 1 a 2 d?as h?biles. ? ?Para renovar recetas, por favor pida a su farmacia que  se ponga en contacto con nuestra oficina. Nuestro n?mero de fax es el [phone]. ? ?Si tiene un asunto urgente cuando la cl?nica est? cerrada y que no puede esperar hasta el siguiente d?a h?bil, puede llamar/localizar a su doctor(a) al n?mero que aparece a continuaci?n.  ? ?Por favor, tenga en cuenta que aunque hacemos todo lo posible para estar disponibles para asuntos urgentes fuera del horario de oficina, no estamos disponibles las 24 horas del d?a, los 7 d?as de la semana.  ? ?Si tiene un problema urgente y no puede comunicarse con nosotros, puede optar por buscar atenci?n m?dica  en el consultorio de su doctor(a), en una cl?nica privada, en un centro de atenci?n urgente o en una sala de emergencias. ? ?Si tiene Engineer, maintenance (IT) m?dica, por favor llame inmediatamente al 911 o vaya a la sala de emergencias. ? ?N?meros de b?per ? ?- Dr. Nehemiah Massed: [phone] ? ?- Dra. Moye: [phone] ? ?- Dra. Nicole Kindred: [phone] ? ?En caso de inclemencias del tiempo, por favor llame a nuestra l?nea principal al [phone] para una actualizaci?n sobre el estado de cualquier retraso o cierre. ? ?Consejos para la medicaci?n en dermatolog?a: ?Por favor, guarde las cajas en las que vienen los medicamentos de uso t?pico para ayudarle a seguir las instrucciones sobre d?nde y c?mo usarlos. Las farmacias generalmente imprimen las instrucciones del medicamento s?lo en las cajas y no directamente en los tubos del [place].  ? ?Si su medicamento es muy caro, por favor, p?ngase en contacto con Zigmund Daniel llamando al [phone] y presione la opci?n 4 o env?enos un mensaje a trav?s de MyChart.  ? ?No podemos decirle cu?l ser? su copago por los medicamentos por adelantado ya que esto es diferente dependiendo de la cobertura de su seguro. Sin embargo, es posible que podamos encontrar un medicamento sustituto a Electrical engineer un formulario para que el seguro cubra el medicamento que se considera necesario.  ? ?Si se  requiere Ardelia Mems autorizaci?n previa para que su compa??a de seguros [place] su medicamento, por favor perm?tanos de 1 a 2 d?as h?biles para completar este proceso. ? ?Los precios de los medicamentos var?an con frecuencia dependiendo del Environmental consultant de d?nde se surte la receta y alguna farmacias pueden ofrecer precios m?s baratos. ? ?El sitio web www.goodrx.com tiene cupones para medicamentos de Airline pilot. Los precios aqu? no tienen en cuenta lo que podr?a costar con la ayuda del seguro (puede ser m?s barato con su seguro), pero el sitio web puede darle el precio si no utiliz? ning?n seguro.  ?- Puede imprimir el cup?n correspondiente y llevarlo con su receta a la farmacia.  ?- Tambi?n puede pasar por nuestra oficina durante el horario de atenci?n regular y recoger una tarjeta de cupones de GoodRx.  ?- Si necesita que su receta se env?e electr?nicamente a Chiropodist, informe a nuestra oficina a trav?s de MyChart de [place] o por tel?fono llamando al [phone] y presione la  opci?n 4.  ?

## 2021-05-29 NOTE — Progress Notes (Signed)
? ?Follow-Up Visit ?  ?Subjective  ?Terry Horn. is a 61 y.o. male who presents for the following: Total body skin exam (Hx of BCC L scapula), check spot (R calf, wife noticed, no symptoms), and check spots (Scalp, irritating). ?The patient presents for Total-Body Skin Exam (TBSE) for skin cancer screening and mole check.  The patient has spots, moles and lesions to be evaluated, some may be new or changing and the patient has concerns that these could be cancer.  ? ?The following portions of the chart were reviewed this encounter and updated as appropriate:  ? Tobacco  Allergies  Meds  Problems  Med Hx  Surg Hx  Fam Hx   ?  ?Review of Systems:  No other skin or systemic complaints except as noted in HPI or Assessment and Plan. ? ?Objective  ?Well appearing patient in no apparent distress; mood and affect are within normal limits. ? ?A full examination was performed including scalp, head, eyes, ears, nose, lips, neck, chest, axillae, abdomen, back, buttocks, bilateral upper extremities, bilateral lower extremities, hands, feet, fingers, toes, fingernails, and toenails. All findings within normal limits unless otherwise noted below. ? ?L scapula ?Well healed scar with no evidence of recurrence.  ? ?eyebrows ?Mild erythema and scale ? ?R calf, back, neck, face, scalp x > 20 (20) ?Stuck on waxy paps with erythema ? ?Right Axilla ?Erythema R axilla ? ?L upper eyelid x 1 ?Stuck on waxy paps with erythema  ? ? ?Assessment & Plan  ? ?Lentigines ?- Scattered tan macules ?- Due to sun exposure ?- Benign-appearing, observe ?- Recommend daily broad spectrum sunscreen SPF 30+ to sun-exposed areas, reapply every 2 hours as needed. ?- Call for any changes ? ?Seborrheic Keratoses ?- Stuck-on, waxy, tan-brown papules and/or plaques  ?- Benign-appearing ?- Discussed benign etiology and prognosis. ?- Observe ?- Call for any changes ? ?Melanocytic Nevi ?- Tan-brown and/or pink-flesh-colored symmetric macules and papules ?-  Benign appearing on exam today ?- Observation ?- Call clinic for new or changing moles ?- Recommend daily use of broad spectrum spf 30+ sunscreen to sun-exposed areas.  ? ?Hemangiomas ?- Red papules ?- Discussed benign nature ?- Observe ?- Call for any changes ? ?Actinic Damage ?- Chronic condition, secondary to cumulative UV/sun exposure ?- diffuse scaly erythematous macules with underlying dyspigmentation ?- Recommend daily broad spectrum sunscreen SPF 30+ to sun-exposed areas, reapply every 2 hours as needed.  ?- Staying in the shade or wearing long sleeves, sun glasses (UVA+UVB protection) and wide brim hats (4-inch brim around the entire circumference of the hat) are also recommended for sun protection.  ?- Call for new or changing lesions. ? ?Skin cancer screening performed today. ? ?History of basal cell carcinoma (BCC) ?L scapula ?Clear. Observe for recurrence. Call clinic for new or changing lesions.  Recommend regular skin exams, daily broad-spectrum spf 30+ sunscreen use, and photoprotection.   ? ?Seborrheic dermatitis ?eyebrows ?Seborrheic Dermatitis  ?-  is a chronic persistent rash characterized by pinkness and scaling most commonly of the mid face but also can occur on the scalp (dandruff), ears; mid chest, mid back and groin.  It tends to be exacerbated by stress and cooler weather.  People who have neurologic disease may experience new onset or exacerbation of existing seborrheic dermatitis.  The condition is not curable but treatable and can be controlled. ? ?Start Ketoconazole 2% cr 3x/wk Monday, Wednesday, Friday ?Start HC 2.5% cr 3x/wk Tuesday, Thursday, Saturday ? ?Topical steroids (such as triamcinolone,  fluocinolone, fluocinonide, mometasone, clobetasol, halobetasol, betamethasone, hydrocortisone) can cause thinning and lightening of the skin if they are used for too long in the same area. Your physician has selected the right strength medicine for your problem and area affected on the body.  Please use your medication only as directed by your physician to prevent side effects.   ? ?ketoconazole (NIZORAL) 2 % cream - eyebrows ?Apply 1 application. topically 3 (three) times a week. 3 times weekly to scaly areas brows, Monday, Wednesday, Friday, qd to right axilla prn flares ?hydrocortisone 2.5 % cream - eyebrows ?Apply topically 3 (three) times a week. 3 times weekly to scaly areas brows, Tuesday, Thursday, Saturday ? ?Inflamed seborrheic keratosis (20) ?Symptomatic and irritating.  Patient would like treatment today. ?R calf, back, neck, face, scalp x > 20 ?Destruction of lesion - R calf, back, neck, face, scalp x > 20 ?Complexity: simple   ?Destruction method: cryotherapy   ?Informed consent: discussed and consent obtained   ?Timeout:  patient name, date of birth, surgical site, and procedure verified ?Lesion destroyed using liquid nitrogen: Yes   ?Region frozen until ice ball extended beyond lesion: Yes   ?Outcome: patient tolerated procedure well with no complications   ?Post-procedure details: wound care instructions given   ? ?Intertrigo ?Right Axilla ?Intertrigo is a chronic recurrent rash that occurs in skin fold areas that may be associated with friction; heat; moisture; yeast; fungus; and bacteria.  It is exacerbated by increased movement / activity; sweating; and higher atmospheric temperature. ? ?Start Ketoconazole 2% cr qd prn flares ? ?Seborrheic keratosis, inflamed ?Symptomatic and irritating.  Patient would like treatment today. ?L upper eyelid margin x 1 ?Destruction of lesion - L upper eyelid x 1 ?Complexity: simple   ?Destruction method: cryotherapy   ?Informed consent: discussed and consent obtained   ?Timeout:  patient name, date of birth, surgical site, and procedure verified ?Lesion destroyed using liquid nitrogen: Yes   ?Region frozen until ice ball extended beyond lesion: Yes   ?Outcome: patient tolerated procedure well with no complications   ?Post-procedure details: wound care  instructions given   ? ?Skin cancer screening ? ?Return in about 1 year (around 05/30/2022) for TBSE, Hx of BCC. ? ?I, Othelia Pulling, RMA, am acting as scribe for Sarina Ser, MD . ?Documentation: I have reviewed the above documentation for accuracy and completeness, and I agree with the above. ? ?Sarina Ser, MD ? ?

## 2021-05-30 ENCOUNTER — Encounter: Payer: Self-pay | Admitting: Dermatology

## 2021-06-10 DIAGNOSIS — G4733 Obstructive sleep apnea (adult) (pediatric): Secondary | ICD-10-CM | POA: Diagnosis not present

## 2021-07-22 ENCOUNTER — Encounter: Payer: Self-pay | Admitting: Family Medicine

## 2021-07-22 ENCOUNTER — Ambulatory Visit: Payer: BC Managed Care – PPO | Admitting: Family Medicine

## 2021-07-22 VITALS — BP 130/80 | HR 71 | Temp 98.4°F | Resp 16 | Wt 338.0 lb

## 2021-07-22 DIAGNOSIS — M7022 Olecranon bursitis, left elbow: Secondary | ICD-10-CM

## 2021-07-22 MED ORDER — AMOXICILLIN-POT CLAVULANATE 875-125 MG PO TABS: 1.0000 | ORAL_TABLET | Freq: Two times a day (BID) | ORAL | 0 refills | Status: DC

## 2021-07-22 NOTE — Progress Notes (Unsigned)
Established patient visit  I,April Miller,acting as a scribe for Wilhemena Durie, MD.,have documented all relevant documentation on the behalf of Wilhemena Durie, MD,as directed by  Wilhemena Durie, MD while in the presence of Wilhemena Durie, MD.   Patient: Terry Horn.   DOB: 10/02/1960   61 y.o. Male  MRN: [id number] Visit Date: 07/22/2021  Today's healthcare provider: Wilhemena Durie, MD   Chief Complaint  Patient presents with   Elbow Pain   Subjective    HPI  Patient hit his left elbow around 1 week ago. Patient states there is a knot and an abrasion that has formed on his left elbow. It appears to be infected. Patient states there is pain when elbow is touched. 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  His oldest son recently got married and just left the police force to start work with [employer].  His younger son is starting law school at [employer] this fall. Medications: Outpatient Medications Prior to Visit  Medication Sig   aspirin 81 MG tablet Take 81 mg by mouth daily.    Azelastine-Fluticasone (DYMISTA) 137-50 MCG/ACT SUSP Place 2 sprays into the nose daily.   cetirizine-pseudoephedrine (ZYRTEC-D) 5-120 MG tablet Take 1 tablet by mouth daily.    Cholecalciferol 1000 UNITS tablet Take 1,000 Units by mouth daily.    Dextran 70-Hypromellose (ARTIFICIAL TEARS) 0.1-0.3 % SOLN Place 1 drop into both eyes daily as needed (Dry eye).   diclofenac (VOLTAREN) 50 MG EC tablet Take 1 tablet (50 mg total) by mouth 2 (two) times daily.   finasteride (PROSCAR) 5 MG tablet TAKE ONE TABLET BY MOUTH DAILY   fish oil-omega-3 fatty acids 1000 MG capsule Take 2,000 tablets by mouth daily.    gabapentin (NEURONTIN) 300 MG capsule TAKE ONE CAPSULE BY MOUTH EVERY MORNING AND TAKE TWO CAPSULES BY MOUTH EVERY NIGHT AT BEDTIME   hydrocortisone 2.5 % cream Apply topically 3 (three) times a week. 3 times weekly to scaly areas brows, Tuesday,  Thursday, Saturday   losartan-hydrochlorothiazide (HYZAAR) 100-12.5 MG tablet TAKE ONE TABLET BY MOUTH DAILY   Misc Natural Products (GLUCOSAMINE CHONDROITIN COMPLX) TABS Take 2 tablets by mouth daily.    Multiple Vitamin tablet Take 1 tablet by mouth daily.    pravastatin (PRAVACHOL) 80 MG tablet Take 80 mg by mouth daily.   Probiotic Product (PROBIOTIC DAILY) CAPS Take 1 tablet by mouth daily.   tamsulosin (FLOMAX) 0.4 MG CAPS capsule TAKE ONE CAPSULE BY MOUTH DAILY   Turmeric, Curcuma Longa, POWD Take 1 tablet by mouth daily.    vitamin C (ASCORBIC ACID) 500 MG tablet Take 500 mg by mouth daily.    vitamin E 400 UNIT capsule Take 400 Units by mouth daily.    pravastatin (PRAVACHOL) 20 MG tablet Take 20 mg by mouth at bedtime.    No facility-administered medications prior to visit.    Review of Systems  Constitutional:  Negative for appetite change, chills and fever.  Respiratory:  Negative for chest tightness, shortness of breath and wheezing.   Cardiovascular:  Negative for chest pain and palpitations.  Gastrointestinal:  Negative for abdominal pain, nausea and vomiting.   Last lipids Lab Results  Component Value Date   CHOL 177 08/06/2020   HDL 65 08/06/2020   LDLCALC 100 (H) 08/06/2020   TRIG 63 08/06/2020   CHOLHDL 2.7 08/06/2020       Objective    BP  130/80 (BP Location: Right Arm, Patient Position: Sitting, Cuff Size: Large)   Pulse 71   Temp 98.4 F (36.9 C) (Temporal)   Resp 16   Wt (!) 338 lb (153.3 kg)   SpO2 96%   BMI 47.14 kg/m  BP Readings from Last 3 Encounters:  07/22/21 130/80  01/06/21 (!) 119/59  08/06/20 126/76   Wt Readings from Last 3 Encounters:  07/22/21 (!) 338 lb (153.3 kg)  01/06/21 (!) 329 lb (149.2 kg)  08/06/20 (!) 333 lb (151 kg)      Physical Exam Vitals reviewed.  Constitutional:      General: He is not in acute distress.    Appearance: He is well-developed.  HENT:     Head: Normocephalic and atraumatic.     Right Ear:  Hearing normal.     Left Ear: Hearing normal.     Nose: Nose normal.  Eyes:     General: Lids are normal. No scleral icterus.       Right eye: No discharge.        Left eye: No discharge.     Conjunctiva/sclera: Conjunctivae normal.  Cardiovascular:     Rate and Rhythm: Normal rate and regular rhythm.     Heart sounds: Normal heart sounds.  Pulmonary:     Effort: Pulmonary effort is normal. No respiratory distress.  Skin:    General: Skin is warm and dry.     Findings: No lesion or rash.     Comments: There are 2 small healing abrasions just beside the medial elbow on the left arm.  There is minimal swelling of the olecranon bursa without any tenderness fluctuance induration erythema or signs of infection  Neurological:     General: No focal deficit present.     Mental Status: He is alert and oriented to person, place, and time.  Psychiatric:        Mood and Affect: Mood normal.        Speech: Speech normal.        Behavior: Behavior normal.        Thought Content: Thought content normal.        Judgment: Judgment normal.      No results found for any visits on 07/22/21.  Assessment & Plan     1. Olecranon bursitis of left elbow We will cover for infection in case he gets worse over the weekend.  He is not to take the antibiotic unless he gets worse.  Patient understands this and is comfortable with the plan. - amoxicillin-clavulanate (AUGMENTIN) 875-125 MG tablet; Take 1 tablet by mouth 2 (two) times daily.  Dispense: 10 tablet; Refill: 0   No follow-ups on file.      I, Wilhemena Durie, MD, have reviewed all documentation for this visit. The documentation on 07/23/21 for the exam, diagnosis, procedures, and orders are all accurate and complete.    Engelbert Sevin Cranford Mon, MD  [facility] [phone] (phone) [phone] (fax)  [place]

## 2021-08-11 ENCOUNTER — Encounter: Payer: Self-pay | Admitting: Family Medicine

## 2021-08-11 ENCOUNTER — Ambulatory Visit (INDEPENDENT_AMBULATORY_CARE_PROVIDER_SITE_OTHER): Payer: BC Managed Care – PPO | Admitting: Family Medicine

## 2021-08-11 VITALS — BP 120/72 | HR 66 | Temp 98.3°F | Resp 16 | Wt 339.0 lb

## 2021-08-11 DIAGNOSIS — Z125 Encounter for screening for malignant neoplasm of prostate: Secondary | ICD-10-CM

## 2021-08-11 DIAGNOSIS — Z1389 Encounter for screening for other disorder: Secondary | ICD-10-CM | POA: Diagnosis not present

## 2021-08-11 DIAGNOSIS — Z23 Encounter for immunization: Secondary | ICD-10-CM | POA: Diagnosis not present

## 2021-08-11 DIAGNOSIS — M545 Low back pain, unspecified: Secondary | ICD-10-CM

## 2021-08-11 DIAGNOSIS — R739 Hyperglycemia, unspecified: Secondary | ICD-10-CM

## 2021-08-11 DIAGNOSIS — G629 Polyneuropathy, unspecified: Secondary | ICD-10-CM | POA: Diagnosis not present

## 2021-08-11 DIAGNOSIS — E782 Mixed hyperlipidemia: Secondary | ICD-10-CM

## 2021-08-11 DIAGNOSIS — I10 Essential (primary) hypertension: Secondary | ICD-10-CM

## 2021-08-11 DIAGNOSIS — Z0000 Encounter for general adult medical examination without abnormal findings: Secondary | ICD-10-CM

## 2021-08-11 DIAGNOSIS — G4733 Obstructive sleep apnea (adult) (pediatric): Secondary | ICD-10-CM

## 2021-08-11 DIAGNOSIS — Q7649 Other congenital malformations of spine, not associated with scoliosis: Secondary | ICD-10-CM

## 2021-08-11 DIAGNOSIS — G8929 Other chronic pain: Secondary | ICD-10-CM

## 2021-08-11 DIAGNOSIS — Z6841 Body Mass Index (BMI) 40.0 and over, adult: Secondary | ICD-10-CM

## 2021-08-11 DIAGNOSIS — E66813 Obesity, class 3: Secondary | ICD-10-CM

## 2021-08-11 LAB — POCT URINALYSIS DIPSTICK
Bilirubin, UA: NEGATIVE
Clarity, UA: NORMAL
Glucose, UA: NEGATIVE
Ketones, UA: NEGATIVE
Leukocytes, UA: NEGATIVE
Nitrite, UA: NEGATIVE
Protein, UA: NEGATIVE
Spec Grav, UA: <=1.005 — AB (ref 1.010–1.025)
Urobilinogen, UA: 2 E.U./dL — AB
pH, UA: 6.5 (ref 5.0–8.0)

## 2021-08-11 MED ORDER — CELECOXIB 200 MG PO CAPS: 200.0000 mg | ORAL_CAPSULE | Freq: Every day | ORAL | 0 refills | Status: DC | PRN

## 2021-08-11 MED ORDER — CYCLOBENZAPRINE HCL 10 MG PO TABS: 10.0000 mg | ORAL_TABLET | Freq: Every day | ORAL | 0 refills | Status: DC

## 2021-08-11 NOTE — Progress Notes (Signed)
Complete physical exam  I,Jana Robinson,acting as a scribe for Wilhemena Durie, MD.,have documented all relevant documentation on the behalf of Wilhemena Durie, MD,as directed by  Wilhemena Durie, MD while in the presence of Wilhemena Durie, MD.    Patient: Terry Horn.   DOB: 27-Jun-1960   61 y.o. Male  MRN: [id number] Visit Date: 08/11/2021  Today's healthcare provider: Wilhemena Durie, MD   Chief Complaint  Patient presents with   Annual Exam   Subjective    Terry Horn. is a 61 y.o. male who presents today for a complete physical exam.  He reports consuming a general diet. Patient is exercised 3 days per wk at gym with training. He generally feels well. He reports sleeping poorly x 2 wks.due to right hip pain. He does have additional problems to discuss today.  Hip and left hand pain.  He is having progressively worse pain that started last night in his left hip back lower back buttocks and down his left leg.  It is not weight bearing but appears to be worse with some walking.  No known trauma.  Past Medical History:  Diagnosis Date   Arthritis    joints,hands,elbow   Basal cell carcinoma    L scapula   Cancer (HCC)    skin cancer removed   GERD (gastroesophageal reflux disease)    Hypertension    Pneumonia    Sleep apnea    with cpap   Past Surgical History:  Procedure Laterality Date   EXCISION OF SKIN TAG N/A 02/06/2016   Procedure: EXCISION OF SKIN TAG;  Surgeon: [name], MD;  Location: [facility] ORS;  Service: General;  Laterality: N/A;   TONSILLECTOMY     Social History   Socioeconomic History   Marital status: Married    Spouse name: Not on file   Number of children: Not on file   Years of education: Not on file   Highest education level: Not on file  Occupational History   Not on file  Tobacco Use   Smoking status: Former    Years: 22.00    Types: Cigarettes    Quit date: 09/05/1996    Years since quitting: 24.9    Smokeless tobacco: Never   Tobacco comments:    Quit in 1998  Substance and Sexual Activity   Alcohol use: Yes    Comment: occasionally   Drug use: No   Sexual activity: Not on file  Other Topics Concern   Not on file  Social History Narrative   Not on file   Social Determinants of Health   Financial Resource Strain: Not on file  Food Insecurity: Not on file  Transportation Needs: Not on file  Physical Activity: Not on file  Stress: Not on file  Social Connections: Not on file  Intimate Partner Violence: Not on file   Family Status  Relation Name Status   Mother  Alive   Sister  Alive   PGF  Deceased   Father  Alive   Son  Alive   Son  Alive   Psychiatrist  (Not Specified)   Family History  Problem Relation Age of Onset   Bipolar disorder Mother    Thyroid disease Mother    Depression Sister    Prostate cancer Paternal Grandfather    Prostate cancer Paternal Uncle    No Known Allergies  Patient Care Team: Jerrol Banana., MD as PCP -  General (Family Medicine) Jerrol Banana., MD (Family Medicine) Bary Castilla Forest Gleason, MD (General Surgery)   Medications: Outpatient Medications Prior to Visit  Medication Sig   aspirin 81 MG tablet Take 81 mg by mouth daily.    cetirizine-pseudoephedrine (ZYRTEC-D) 5-120 MG tablet Take 1 tablet by mouth daily.    Cholecalciferol 1000 UNITS tablet Take 1,000 Units by mouth daily.    Dextran 70-Hypromellose (ARTIFICIAL TEARS) 0.1-0.3 % SOLN Place 1 drop into both eyes daily as needed (Dry eye).   diclofenac (VOLTAREN) 50 MG EC tablet Take 1 tablet (50 mg total) by mouth 2 (two) times daily.   finasteride (PROSCAR) 5 MG tablet TAKE ONE TABLET BY MOUTH DAILY   fish oil-omega-3 fatty acids 1000 MG capsule Take 2,000 tablets by mouth daily.    gabapentin (NEURONTIN) 300 MG capsule TAKE ONE CAPSULE BY MOUTH EVERY MORNING AND TAKE TWO CAPSULES BY MOUTH EVERY NIGHT AT BEDTIME   hydrocortisone 2.5 % cream Apply topically 3  (three) times a week. 3 times weekly to scaly areas brows, Tuesday, Thursday, Saturday   losartan-hydrochlorothiazide (HYZAAR) 100-12.5 MG tablet TAKE ONE TABLET BY MOUTH DAILY   Misc Natural Products (GLUCOSAMINE CHONDROITIN COMPLX) TABS Take 2 tablets by mouth daily.    Multiple Vitamin tablet Take 1 tablet by mouth daily.    pravastatin (PRAVACHOL) 80 MG tablet Take 80 mg by mouth daily.   Probiotic Product (PROBIOTIC DAILY) CAPS Take 1 tablet by mouth daily.   tamsulosin (FLOMAX) 0.4 MG CAPS capsule TAKE ONE CAPSULE BY MOUTH DAILY   Turmeric, Curcuma Longa, POWD Take 1 tablet by mouth daily.    vitamin C (ASCORBIC ACID) 500 MG tablet Take 500 mg by mouth daily.    vitamin E 400 UNIT capsule Take 400 Units by mouth daily.    Azelastine-Fluticasone (DYMISTA) 137-50 MCG/ACT SUSP Place 2 sprays into the nose daily. (Patient not taking: Reported on 08/11/2021)   pravastatin (PRAVACHOL) 20 MG tablet Take 20 mg by mouth at bedtime.    [DISCONTINUED] amoxicillin-clavulanate (AUGMENTIN) 875-125 MG tablet Take 1 tablet by mouth 2 (two) times daily.   No facility-administered medications prior to visit.    Review of Systems  Musculoskeletal:  Positive for myalgias.  All other systems reviewed and are negative.     Objective     BP 120/72 (BP Location: Right Arm, Patient Position: Sitting, Cuff Size: Large)   Pulse 66   Temp 98.3 F (36.8 C) (Oral)   Resp 16   Wt (!) 339 lb (153.8 kg)   SpO2 95%   BMI 47.28 kg/m     Physical Exam Vitals reviewed.  Constitutional:      Appearance: He is obese.  HENT:     Head: Normocephalic and atraumatic.     Right Ear: Tympanic membrane, ear canal and external ear normal.     Left Ear: Tympanic membrane, ear canal and external ear normal.     Nose: Nose normal.     Mouth/Throat:     Mouth: Mucous membranes are moist.     Pharynx: Oropharynx is clear.  Eyes:     Extraocular Movements: Extraocular movements intact.     Conjunctiva/sclera:  Conjunctivae normal.     Pupils: Pupils are equal, round, and reactive to light.  Cardiovascular:     Rate and Rhythm: Normal rate and regular rhythm.     Pulses: Normal pulses.     Heart sounds: Normal heart sounds.  Pulmonary:     Effort: Pulmonary effort is  normal.     Breath sounds: Normal breath sounds.  Abdominal:     General: Abdomen is flat.     Palpations: Abdomen is soft.  Genitourinary:    Penis: Normal.      Testes: Normal.  Musculoskeletal:     Comments: Mild lymphedema.  Skin:    General: Skin is warm and dry.  Neurological:     General: No focal deficit present.     Mental Status: He is alert and oriented to person, place, and time.     Comments: Right leg raise is negative bilaterally and strength is normal in both lower extremity  Psychiatric:        Mood and Affect: Mood normal.        Behavior: Behavior normal.        Thought Content: Thought content normal.        Judgment: Judgment normal.       Last depression screening scores    08/11/2021    9:05 AM 08/06/2020    9:18 AM 01/29/2020    3:54 PM  PHQ 2/9 Scores  PHQ - 2 Score 0 0 0  PHQ- 9 Score 4 0 0   Last fall risk screening    08/11/2021    9:05 AM  Fall Risk   Falls in the past year? 1  Number falls in past yr: 1  Injury with Fall? 0  Follow up Falls evaluation completed   Last Audit-C alcohol use screening    08/11/2021    9:06 AM  Alcohol Use Disorder Test (AUDIT)  1. How often do you have a drink containing alcohol? 2  2. How many drinks containing alcohol do you have on a typical day when you are drinking? 0  3. How often do you have six or more drinks on one occasion? 0  AUDIT-C Score 2   A score of 3 or more in women, and 4 or more in men indicates increased risk for alcohol abuse, EXCEPT if all of the points are from question 1   No results found for any visits on 08/11/21.  Assessment & Plan    Routine Health Maintenance and Physical Exam  Exercise Activities and  Dietary recommendations  Goals   None     Immunization History  Administered Date(s) Administered   Influenza,inj,Quad PF,6+ Mos 11/29/2017, 12/05/2018   Influenza,inj,Quad PF,6-35 Mos 12/09/2020   Pneumococcal Polysaccharide-23 12/21/1991   Tdap 10/26/2007   Zoster Recombinat (Shingrix) 05/14/2018, 09/09/2018    Health Maintenance  Topic Date Due   COVID-19 Vaccine (1) Never done   HIV Screening  Never done   Hepatitis C Screening  Never done   TETANUS/TDAP  10/25/2017   INFLUENZA VACCINE  09/30/2021   COLONOSCOPY (Pts 45-14yr Insurance coverage will need to be confirmed)  02/05/2026   Zoster Vaccines- Shingrix  Completed   HPV VACCINES  Aged Out    Discussed health benefits of physical activity, and encouraged him to engage in regular exercise appropriate for his age and condition.  1. Annual physical exam  - Lipid panel - CBC w/Diff/Platelet - Comprehensive Metabolic Panel (CMET) - TSH - Hemoglobin A1c  2. Essential (primary) hypertension  - Lipid panel - CBC w/Diff/Platelet - Comprehensive Metabolic Panel (CMET) - TSH - Hemoglobin A1c  3. Mixed hyperlipidemia  - Lipid panel - CBC w/Diff/Platelet - Comprehensive Metabolic Panel (CMET) - TSH - Hemoglobin A1c  4. Hyperglycemia  - Lipid panel - CBC w/Diff/Platelet - Comprehensive Metabolic Panel (CMET) -  TSH - Hemoglobin A1c  5. Neuropathy  - Lipid panel - CBC w/Diff/Platelet - Comprehensive Metabolic Panel (CMET) - TSH - Hemoglobin A1c  6. Obstructive sleep apnea syndrome  - Lipid panel - CBC w/Diff/Platelet - Comprehensive Metabolic Panel (CMET) - TSH - Hemoglobin A1c  7. Class 3 severe obesity due to excess calories with serious comorbidity and body mass index (BMI) of 45.0 to 49.9 in adult (HCC)  - Lipid panel - CBC w/Diff/Platelet - Comprehensive Metabolic Panel (CMET) - TSH - Hemoglobin A1c  8. Need for tetanus booster  - Tdap vaccine greater than or equal to 7yo IM  9.  Screening for blood or protein in urine  - POCT Urinalysis Dipstick  10. Screening for prostate cancer  - PSA  11. Acute  low back pain with sciatica Patient already has a history of spinal surgery.  We will treat initially with anti-inflammatories and muscle relaxants.  He will let us know if he gets worse. - celecoxib (CELEBREX) 200 MG capsule; Take 1 capsule (200 mg total) by mouth daily as needed.  Dispense: 30 capsule; Refill: 0 - cyclobenzaprine (FLEXERIL) 10 MG tablet; Take 1 tablet (10 mg total) by mouth at bedtime.  Dispense: 30 tablet; Refill: 0   No follow-ups on file.     I, Wilhemena Durie, MD, have reviewed all documentation for this visit. The documentation on 08/18/21 for the exam, diagnosis, procedures, and orders are all accurate and complete.    Makaila Windle Cranford Mon, MD  [facility] [phone] (phone) [phone] (fax)  [place]

## 2021-08-12 ENCOUNTER — Ambulatory Visit: Payer: Self-pay | Admitting: *Deleted

## 2021-08-12 LAB — CBC WITH DIFFERENTIAL/PLATELET
Basophils Absolute: 0.1 10*3/uL (ref 0.0–0.2)
Basos: 1 %
EOS (ABSOLUTE): 0.1 10*3/uL (ref 0.0–0.4)
Eos: 1 %
Hematocrit: 42.3 % (ref 37.5–51.0)
Hemoglobin: 13.9 g/dL (ref 13.0–17.7)
Immature Grans (Abs): 0 10*3/uL (ref 0.0–0.1)
Immature Granulocytes: 1 %
Lymphocytes Absolute: 1.8 10*3/uL (ref 0.7–3.1)
Lymphs: 22 %
MCH: 28 pg (ref 26.6–33.0)
MCHC: 32.9 g/dL (ref 31.5–35.7)
MCV: 85 fL (ref 79–97)
Monocytes Absolute: 0.6 10*3/uL (ref 0.1–0.9)
Monocytes: 7 %
Neutrophils Absolute: 5.6 10*3/uL (ref 1.4–7.0)
Neutrophils: 68 %
Platelets: 239 10*3/uL (ref 150–450)
RBC: 4.96 x10E6/uL (ref 4.14–5.80)
RDW: 13.7 % (ref 11.6–15.4)
WBC: 8.2 10*3/uL (ref 3.4–10.8)

## 2021-08-12 LAB — COMPREHENSIVE METABOLIC PANEL
ALT: 25 IU/L (ref 0–44)
AST: 24 IU/L (ref 0–40)
Albumin/Globulin Ratio: 2 (ref 1.2–2.2)
Albumin: 4.5 g/dL (ref 3.8–4.9)
Alkaline Phosphatase: 74 IU/L (ref 44–121)
BUN/Creatinine Ratio: 23 (ref 10–24)
BUN: 18 mg/dL (ref 8–27)
Bilirubin Total: 0.4 mg/dL (ref 0.0–1.2)
CO2: 26 mmol/L (ref 20–29)
Calcium: 9.7 mg/dL (ref 8.6–10.2)
Chloride: 100 mmol/L (ref 96–106)
Creatinine, Ser: 0.79 mg/dL (ref 0.76–1.27)
Globulin, Total: 2.2 g/dL (ref 1.5–4.5)
Glucose: 94 mg/dL (ref 70–99)
Potassium: 4.3 mmol/L (ref 3.5–5.2)
Sodium: 140 mmol/L (ref 134–144)
Total Protein: 6.7 g/dL (ref 6.0–8.5)
eGFR: 102 mL/min/{1.73_m2} (ref >59)

## 2021-08-12 LAB — HEMOGLOBIN A1C
Est. average glucose Bld gHb Est-mCnc: 117 mg/dL
Hgb A1c MFr Bld: 5.7 % — ABNORMAL HIGH (ref 4.8–5.6)

## 2021-08-12 LAB — LIPID PANEL
Chol/HDL Ratio: 2.7 ratio (ref 0.0–5.0)
Cholesterol, Total: 171 mg/dL (ref 100–199)
HDL: 63 mg/dL (ref >39)
LDL Chol Calc (NIH): 97 mg/dL (ref 0–99)
Triglycerides: 58 mg/dL (ref 0–149)
VLDL Cholesterol Cal: 11 mg/dL (ref 5–40)

## 2021-08-12 LAB — PSA: Prostate Specific Ag, Serum: <0.1 ng/mL (ref 0.0–4.0)

## 2021-08-12 LAB — TSH: TSH: 0.852 u[IU]/mL (ref 0.450–4.500)

## 2021-08-12 NOTE — Telephone Encounter (Signed)
Summary: needs advise for back pain   Pt saw Dr Rosanna Randy yesterday for back pain, and states his back pain is worse today.  Pt having trouble walking and getting in/out of car.  Having spasms in back.  Would like Dr Rosanna Randy to call him, or call his neuro surgeon Dr Arnoldo Morale for appt asap.  [phone] if after 3 pm      Chief Complaint: back pain, spasm Symptoms: excruciating pain with movement Frequency: every movement Pertinent Negatives: Patient denies fever Disposition: '[]'$ ED /'[]'$ Urgent Care (no appt availability in office) / '[x]'$ Appointment(In office/virtual)/ '[]'$  [facility]/ '[]'$ Home Care/ '[]'$ Refused Recommended Disposition /'[]'$ [facility]/ '[x]'$  Follow-up with PCP Additional Notes: Pt just seen by Dr. Rosanna Randy yesterday. Flexaril not helping. Worse today. Would like Dr. Rosanna Randy to call him to discuss medication and if there is a need for neuro surgeon appt. He would like to be called at this number after 4, [phone], if before 3, [phone]  Reason for Disposition  [1] MODERATE back pain (e.g., interferes with normal activities) AND [2] present > 3 days  Answer Assessment - Initial Assessment Questions 1. ONSET: "When did the pain begin?"      2 weeks 2. LOCATION: "Where does it hurt?" (upper, mid or lower back)     Lower  3. SEVERITY: "How bad is the pain?"  (e.g., Scale 1-10; mild, moderate, or severe)   - MILD (1-3): doesn't interfere with normal activities    - MODERATE (4-7): interferes with normal activities or awakens from sleep    - SEVERE (8-10): excruciating pain, unable to do any normal activities      excruciating 4. PATTERN: "Is the pain constant?" (e.g., yes, no; constant, intermittent)      No, hits with movement 5. RADIATION: "Does the pain shoot into your legs or elsewhere?"     Up back 6. CAUSE:  "What do you think is causing the back pain?"      Unknown, bad back 7. BACK OVERUSE:  "Any recent lifting of heavy objects, strenuous work or  exercise?"     no 8. MEDICATIONS: "What have you taken so far for the pain?" (e.g., nothing, acetaminophen, NSAIDS)     Flexaril with regular pain pill, helped some, not much  10. OTHER SYMPTOMS: "Do you have any other symptoms?" (e.g., fever, abdominal pain, burning with urination, blood in urine)       no 11. PREGNANCY: "Is there any chance you are pregnant?" (e.g., yes, no; LMP)       na  Protocols used: Back Pain-A-AH

## 2021-08-13 ENCOUNTER — Other Ambulatory Visit: Payer: Self-pay | Admitting: *Deleted

## 2021-08-13 DIAGNOSIS — M47817 Spondylosis without myelopathy or radiculopathy, lumbosacral region: Secondary | ICD-10-CM

## 2021-08-13 DIAGNOSIS — M5136 Other intervertebral disc degeneration, lumbar region: Secondary | ICD-10-CM

## 2021-08-13 DIAGNOSIS — G8929 Other chronic pain: Secondary | ICD-10-CM

## 2021-08-13 MED ORDER — PREDNISONE 10 MG (21) PO TBPK: ORAL_TABLET | ORAL | 1 refills | Status: DC

## 2021-08-13 MED ORDER — HYDROCODONE-ACETAMINOPHEN 5-325 MG PO TABS: 1.0000 | ORAL_TABLET | ORAL | 0 refills | Status: DC | PRN

## 2021-08-13 NOTE — Telephone Encounter (Signed)
Referral ordered

## 2021-08-15 DIAGNOSIS — M5441 Lumbago with sciatica, right side: Secondary | ICD-10-CM | POA: Diagnosis not present

## 2021-08-15 DIAGNOSIS — M4316 Spondylolisthesis, lumbar region: Secondary | ICD-10-CM | POA: Diagnosis not present

## 2021-08-22 ENCOUNTER — Other Ambulatory Visit: Payer: Self-pay | Admitting: Neurosurgery

## 2021-08-22 ENCOUNTER — Encounter: Payer: Self-pay | Admitting: Family Medicine

## 2021-08-22 DIAGNOSIS — M5441 Lumbago with sciatica, right side: Secondary | ICD-10-CM

## 2021-08-23 ENCOUNTER — Other Ambulatory Visit: Payer: Self-pay | Admitting: Family Medicine

## 2021-08-23 DIAGNOSIS — I10 Essential (primary) hypertension: Secondary | ICD-10-CM

## 2021-08-26 ENCOUNTER — Other Ambulatory Visit: Payer: Self-pay

## 2021-08-26 ENCOUNTER — Emergency Department: Payer: BC Managed Care – PPO

## 2021-08-26 ENCOUNTER — Observation Stay
Admission: EM | Admit: 2021-08-26 | Discharge: 2021-08-27 | Payer: BC Managed Care – PPO | Attending: Internal Medicine | Admitting: Internal Medicine

## 2021-08-26 ENCOUNTER — Ambulatory Visit
Admission: RE | Admit: 2021-08-26 | Discharge: 2021-08-26 | Disposition: A | Discharge status: Home / Self Care | Payer: BC Managed Care – PPO | Source: Ambulatory Visit | Attending: Neurosurgery | Admitting: Neurosurgery

## 2021-08-26 ENCOUNTER — Encounter: Payer: Self-pay | Admitting: *Deleted

## 2021-08-26 DIAGNOSIS — K5900 Constipation, unspecified: Secondary | ICD-10-CM | POA: Diagnosis not present

## 2021-08-26 DIAGNOSIS — I2510 Atherosclerotic heart disease of native coronary artery without angina pectoris: Secondary | ICD-10-CM | POA: Insufficient documentation

## 2021-08-26 DIAGNOSIS — Z7982 Long term (current) use of aspirin: Secondary | ICD-10-CM | POA: Insufficient documentation

## 2021-08-26 DIAGNOSIS — M5441 Lumbago with sciatica, right side: Secondary | ICD-10-CM | POA: Insufficient documentation

## 2021-08-26 DIAGNOSIS — N400 Enlarged prostate without lower urinary tract symptoms: Secondary | ICD-10-CM | POA: Insufficient documentation

## 2021-08-26 DIAGNOSIS — I10 Essential (primary) hypertension: Secondary | ICD-10-CM | POA: Insufficient documentation

## 2021-08-26 DIAGNOSIS — M4316 Spondylolisthesis, lumbar region: Secondary | ICD-10-CM | POA: Diagnosis not present

## 2021-08-26 DIAGNOSIS — Z85828 Personal history of other malignant neoplasm of skin: Secondary | ICD-10-CM | POA: Diagnosis not present

## 2021-08-26 DIAGNOSIS — M5459 Other low back pain: Secondary | ICD-10-CM | POA: Diagnosis not present

## 2021-08-26 DIAGNOSIS — A419 Sepsis, unspecified organism: Secondary | ICD-10-CM | POA: Diagnosis not present

## 2021-08-26 DIAGNOSIS — Z79899 Other long term (current) drug therapy: Secondary | ICD-10-CM | POA: Insufficient documentation

## 2021-08-26 DIAGNOSIS — G4733 Obstructive sleep apnea (adult) (pediatric): Secondary | ICD-10-CM | POA: Diagnosis not present

## 2021-08-26 DIAGNOSIS — M48061 Spinal stenosis, lumbar region without neurogenic claudication: Principal | ICD-10-CM | POA: Insufficient documentation

## 2021-08-26 DIAGNOSIS — R6520 Severe sepsis without septic shock: Secondary | ICD-10-CM | POA: Insufficient documentation

## 2021-08-26 DIAGNOSIS — R509 Fever, unspecified: Secondary | ICD-10-CM | POA: Diagnosis not present

## 2021-08-26 DIAGNOSIS — R600 Localized edema: Secondary | ICD-10-CM | POA: Diagnosis present

## 2021-08-26 DIAGNOSIS — G4730 Sleep apnea, unspecified: Secondary | ICD-10-CM | POA: Diagnosis present

## 2021-08-26 DIAGNOSIS — R2243 Localized swelling, mass and lump, lower limb, bilateral: Secondary | ICD-10-CM | POA: Diagnosis not present

## 2021-08-26 DIAGNOSIS — Z6841 Body Mass Index (BMI) 40.0 and over, adult: Secondary | ICD-10-CM | POA: Diagnosis not present

## 2021-08-26 DIAGNOSIS — Z87891 Personal history of nicotine dependence: Secondary | ICD-10-CM | POA: Insufficient documentation

## 2021-08-26 DIAGNOSIS — M545 Low back pain, unspecified: Secondary | ICD-10-CM | POA: Diagnosis present

## 2021-08-26 DIAGNOSIS — I959 Hypotension, unspecified: Secondary | ICD-10-CM | POA: Diagnosis not present

## 2021-08-26 DIAGNOSIS — M549 Dorsalgia, unspecified: Secondary | ICD-10-CM | POA: Diagnosis not present

## 2021-08-26 DIAGNOSIS — M47816 Spondylosis without myelopathy or radiculopathy, lumbar region: Secondary | ICD-10-CM | POA: Diagnosis not present

## 2021-08-26 LAB — URINALYSIS, ROUTINE W REFLEX MICROSCOPIC
Bacteria, UA: NONE SEEN
Bilirubin Urine: NEGATIVE
Glucose, UA: NEGATIVE mg/dL
Ketones, ur: 5 mg/dL — AB
Leukocytes,Ua: NEGATIVE
Nitrite: NEGATIVE
Protein, ur: NEGATIVE mg/dL
Specific Gravity, Urine: 1.026 (ref 1.005–1.030)
pH: 6 (ref 5.0–8.0)

## 2021-08-26 LAB — CBC WITH DIFFERENTIAL/PLATELET
Abs Immature Granulocytes: 0.03 10*3/uL (ref 0.00–0.07)
Basophils Absolute: 0.1 10*3/uL (ref 0.0–0.1)
Basophils Relative: 1 %
Eosinophils Absolute: 0.1 10*3/uL (ref 0.0–0.5)
Eosinophils Relative: 1 %
HCT: 42.6 % (ref 39.0–52.0)
Hemoglobin: 13.3 g/dL (ref 13.0–17.0)
Immature Granulocytes: 0 %
Lymphocytes Relative: 18 %
Lymphs Abs: 1.8 10*3/uL (ref 0.7–4.0)
MCH: 27.7 pg (ref 26.0–34.0)
MCHC: 31.2 g/dL (ref 30.0–36.0)
MCV: 88.8 fL (ref 80.0–100.0)
Monocytes Absolute: 1.1 10*3/uL — ABNORMAL HIGH (ref 0.1–1.0)
Monocytes Relative: 11 %
Neutro Abs: 7.2 10*3/uL (ref 1.7–7.7)
Neutrophils Relative %: 69 %
Platelets: 301 10*3/uL (ref 150–400)
RBC: 4.8 MIL/uL (ref 4.22–5.81)
RDW: 13 % (ref 11.5–15.5)
WBC: 10.2 10*3/uL (ref 4.0–10.5)
nRBC: 0 % (ref 0.0–0.2)

## 2021-08-26 LAB — COMPREHENSIVE METABOLIC PANEL
ALT: 16 U/L (ref 0–44)
AST: 18 U/L (ref 15–41)
Albumin: 3.7 g/dL (ref 3.5–5.0)
Alkaline Phosphatase: 66 U/L (ref 38–126)
Anion gap: 11 (ref 5–15)
BUN: 27 mg/dL — ABNORMAL HIGH (ref 6–20)
CO2: 29 mmol/L (ref 22–32)
Calcium: 9.2 mg/dL (ref 8.9–10.3)
Chloride: 100 mmol/L (ref 98–111)
Creatinine, Ser: 0.75 mg/dL (ref 0.61–1.24)
GFR, Estimated: >60 mL/min (ref >60)
Glucose, Bld: 100 mg/dL — ABNORMAL HIGH (ref 70–99)
Potassium: 4.2 mmol/L (ref 3.5–5.1)
Sodium: 140 mmol/L (ref 135–145)
Total Bilirubin: 0.5 mg/dL (ref 0.3–1.2)
Total Protein: 7.3 g/dL (ref 6.5–8.1)

## 2021-08-26 LAB — LACTIC ACID, PLASMA: Lactic Acid, Venous: 2.8 mmol/L (ref 0.5–1.9)

## 2021-08-26 LAB — LIPASE, BLOOD: Lipase: 35 U/L (ref 11–51)

## 2021-08-26 MED ORDER — SODIUM CHLORIDE 0.9 % IV SOLN
2.0000 g | Freq: Once | INTRAVENOUS | Status: AC
  Administered 2021-08-27: 2 g via INTRAVENOUS
  Filled 2021-08-26: qty 12.5

## 2021-08-26 MED ORDER — ACETAMINOPHEN 500 MG PO TABS
1000.0000 mg | ORAL_TABLET | Freq: Once | ORAL | Status: AC
  Administered 2021-08-27: 1000 mg via ORAL
  Filled 2021-08-26: qty 2

## 2021-08-26 MED ORDER — LACTATED RINGERS IV BOLUS (SEPSIS)
400.0000 mL | Freq: Once | INTRAVENOUS | Status: AC
  Administered 2021-08-27: 400 mL via INTRAVENOUS

## 2021-08-26 MED ORDER — METRONIDAZOLE 500 MG/100ML IV SOLN
500.0000 mg | Freq: Once | INTRAVENOUS | Status: AC
  Administered 2021-08-27: 500 mg via INTRAVENOUS
  Filled 2021-08-26: qty 100

## 2021-08-26 MED ORDER — GADOBUTROL 1 MMOL/ML IV SOLN
10.0000 mL | Freq: Once | INTRAVENOUS | Status: AC | PRN
  Administered 2021-08-26: 10 mL via INTRAVENOUS

## 2021-08-26 MED ORDER — HYDROMORPHONE HCL 1 MG/ML IJ SOLN
0.5000 mg | Freq: Once | INTRAMUSCULAR | Status: AC
  Administered 2021-08-27: 0.5 mg via INTRAVENOUS
  Filled 2021-08-26: qty 0.5

## 2021-08-26 MED ORDER — LACTATED RINGERS IV BOLUS (SEPSIS)
1000.0000 mL | Freq: Once | INTRAVENOUS | Status: AC
  Administered 2021-08-27: 1000 mL via INTRAVENOUS

## 2021-08-26 MED ORDER — VANCOMYCIN HCL IN DEXTROSE 1-5 GM/200ML-% IV SOLN: 1000.0000 mg | Freq: Once | INTRAVENOUS | Status: DC

## 2021-08-26 MED ORDER — DIAZEPAM 5 MG/ML IJ SOLN
2.5000 mg | Freq: Once | INTRAMUSCULAR | Status: AC
  Administered 2021-08-27: 2.5 mg via INTRAVENOUS
  Filled 2021-08-26: qty 2

## 2021-08-26 MED ORDER — VANCOMYCIN HCL IN DEXTROSE 1-5 GM/200ML-% IV SOLN
1000.0000 mg | Freq: Once | INTRAVENOUS | Status: AC
  Administered 2021-08-27: 1000 mg via INTRAVENOUS
  Filled 2021-08-26: qty 200

## 2021-08-26 MED ORDER — VANCOMYCIN HCL 1500 MG/300ML IV SOLN
1500.0000 mg | Freq: Once | INTRAVENOUS | Status: AC
  Administered 2021-08-27: 1500 mg via INTRAVENOUS
  Filled 2021-08-26: qty 300

## 2021-08-26 NOTE — ED Provider Notes (Signed)
[facility] Provider Note    Event Date/Time   First MD Initiated Contact with Patient 08/26/21 2306     (approximate)   History   Back Pain   HPI  Terry Holaway. is a 61 y.o. male brought to the ED via EMS from home with a chief complaint of lower back pain for several weeks.  Patient with a history of lumbar radiculopathy, DDD, lumbar facet arthropathy status post L4-5 decompression and fusion on 02/14/2020.  He has been experiencing low back pain for the past several weeks.  Finally was able to have an MRI yesterday afternoon.  Reports pain severe and not controlled by oxycodone and Valium for muscle spasms.  Has been experiencing chills.  Denies cough, chest pain, shortness of breath, abdominal pain, nausea, vomiting, dysuria or dizziness.  Reports mild bilateral leg weakness without numbness or tingling.  Denies bowel or bladder incontinence.  Has been having to use a wheelchair as he has been unable to ambulate secondary to pain.  Has also noted increased BLE swelling.   Past Medical History   Past Medical History:  Diagnosis Date   Arthritis    joints,hands,elbow   Basal cell carcinoma    L scapula   Cancer ([place])    skin cancer removed   GERD (gastroesophageal reflux disease)    Hypertension    Pneumonia    Sleep apnea    with cpap     Active Problem List   Patient Active Problem List   Diagnosis Date Noted   Severe sepsis ([place]) 08/27/2021   Coronary artery disease involving native coronary artery of native heart 10/10/2020   Spondylolisthesis, lumbar region 02/14/2020   Chronic knee pain (Bilateral) 09/09/2017   Osteoarthritis of knee (Secondary) (Bilateral) 09/09/2017   Congenital Lumbar central spinal stenosis 09/09/2017   Cervical central spinal stenosis 09/09/2017   Carpal tunnel syndrome (Bilateral) 09/09/2017   Abnormal nerve conduction studies 09/09/2017   Osteoarthritis of spine with radiculopathy, lumbosacral region  09/09/2017   Osteoarthritis of facet joint of lumbar spine 09/09/2017   Osteoarthritis involving multiple joints on both sides of body 09/09/2017   Cervicalgia 09/09/2017   Chronic neck pain 09/09/2017   Numbness of upper extremity (Bilateral) 09/09/2017   Numbness of lower extremity (Left) 09/09/2017   Spondylosis without myelopathy or radiculopathy, lumbosacral region 09/09/2017   Lumbar facet syndrome (Midline pain pattern) (Bilateral) 09/09/2017   Lumbar facet arthropathy (L3-4, L4-L5, and L5-S1) (Bilateral) 09/09/2017   Low back pain 09/09/2017   Hyperglycemia 02/20/2016   Chronic low back pain (Primary Area of Pain) (Bilateral) w/ sciatica (Left) 02/20/2016   DDD (degenerative disc disease), lumbar 02/20/2016   Lumbar spondylosis 02/20/2016   DDD (degenerative disc disease), cervical 02/20/2016   Cervical spondylolysis 02/20/2016   History of colonic polyps 02/06/2016   Encounter for screening colonoscopy 01/21/2016   Mixed hyperlipidemia 10/01/2015   Chest pressure 07/26/2015   Paresthesia 07/26/2015   Arthritis 01/02/2015   Body mass index 45.0-49.9, adult ([place]) 01/02/2015   Essential (primary) hypertension 01/02/2015   Adiposity 01/02/2015   Apnea, sleep 01/02/2015   Avitaminosis D 01/02/2015     Past Surgical History   Past Surgical History:  Procedure Laterality Date   EXCISION OF SKIN TAG N/A 02/06/2016   Procedure: EXCISION OF SKIN TAG;  Surgeon: [name], MD;  Location: [facility] ORS;  Service: General;  Laterality: N/A;   TONSILLECTOMY       Home Medications   Prior to Admission  medications   Medication Sig Start Date End Date Taking? Authorizing Provider  aspirin 81 MG tablet Take 81 mg by mouth daily.  04/08/10   [provider]  Azelastine-Fluticasone (DYMISTA) 137-50 MCG/ACT SUSP Place 2 sprays into the nose daily. Patient not taking: Reported on 08/11/2021 04/15/17   Jerrol Banana., MD  celecoxib (CELEBREX) 200 MG capsule Take 1  capsule (200 mg total) by mouth daily as needed. 08/11/21   Jerrol Banana., MD  cetirizine-pseudoephedrine (ZYRTEC-D) 5-120 MG tablet Take 1 tablet by mouth daily.  10/07/10   [provider]  Cholecalciferol 1000 UNITS tablet Take 1,000 Units by mouth daily.  04/08/10   [provider]  cyclobenzaprine (FLEXERIL) 10 MG tablet Take 1 tablet (10 mg total) by mouth at bedtime. 08/11/21   Jerrol Banana., MD  Dextran 70-Hypromellose (ARTIFICIAL TEARS) 0.1-0.3 % SOLN Place 1 drop into both eyes daily as needed (Dry eye).    [provider]  diclofenac (VOLTAREN) 50 MG EC tablet Take 1 tablet (50 mg total) by mouth 2 (two) times daily. 04/11/21   Jerrol Banana., MD  finasteride (PROSCAR) 5 MG tablet TAKE ONE TABLET BY MOUTH DAILY 12/03/20   Jerrol Banana., MD  fish oil-omega-3 fatty acids 1000 MG capsule Take 2,000 tablets by mouth daily.  04/08/10   [provider]  gabapentin (NEURONTIN) 300 MG capsule TAKE ONE CAPSULE BY MOUTH EVERY MORNING AND TAKE TWO CAPSULES BY MOUTH EVERY NIGHT AT BEDTIME 05/28/21   Jerrol Banana., MD  HYDROcodone-acetaminophen [facility]) 5-325 MG tablet Take 1 tablet by mouth every 4 (four) hours as needed for moderate pain or severe pain. 08/13/21   Jerrol Banana., MD  hydrocortisone 2.5 % cream Apply topically 3 (three) times a week. 3 times weekly to scaly areas brows, Tuesday, Thursday, Saturday 05/30/21   Ralene Bathe, MD  losartan-hydrochlorothiazide [facility]) 100-12.5 MG tablet TAKE ONE TABLET BY MOUTH DAILY 08/25/21   Jerrol Banana., MD  Misc Natural Products (GLUCOSAMINE CHONDROITIN COMPLX) TABS Take 2 tablets by mouth daily.     [provider]  Multiple Vitamin tablet Take 1 tablet by mouth daily.  04/08/10   [provider]  pravastatin (PRAVACHOL) 20 MG tablet Take 20 mg by mouth at bedtime.  01/19/17 01/06/21  [provider]  pravastatin (PRAVACHOL) 80 MG tablet  Take 80 mg by mouth daily. 06/28/21   [provider]  predniSONE (STERAPRED UNI-PAK 21 TAB) 10 MG (21) TBPK tablet Taper 6-5-4-3-2-1 08/13/21   Jerrol Banana., MD  Probiotic Product (PROBIOTIC DAILY) CAPS Take 1 tablet by mouth daily.    [provider]  tamsulosin (FLOMAX) 0.4 MG CAPS capsule TAKE ONE CAPSULE BY MOUTH DAILY 03/04/21   Rumball, Jake Church, DO  Turmeric, Lear Ng, POWD Take 1 tablet by mouth daily.     [provider]  vitamin C (ASCORBIC ACID) 500 MG tablet Take 500 mg by mouth daily.  04/08/10   [provider]  vitamin E 400 UNIT capsule Take 400 Units by mouth daily.  04/08/10   [provider]     Allergies  Patient has no known allergies.   Family History   Family History  Problem Relation Age of Onset   Bipolar disorder Mother    Thyroid disease Mother    Depression Sister    Prostate cancer Paternal Grandfather    Prostate cancer Paternal Uncle  Physical Exam  Triage Vital Signs: ED Triage Vitals  Enc Vitals Group     BP 08/26/21 2134 (!) 146/126     Pulse Rate 08/26/21 2134 99     Resp 08/26/21 2134 20     Temp 08/26/21 2134 (!) 100.5 F (38.1 C)     Temp Source 08/26/21 2134 Oral     SpO2 08/26/21 2134 97 %     Weight 08/26/21 2130 (!) 339 lb (153.8 kg)     Height 08/26/21 2130 '5\' 11"'$  (1.803 m)     Head Circumference --      Peak Flow --      Pain Score 08/26/21 2130 8     Pain Loc --      Pain Edu? --      Excl. in [place]? --     Updated Vital Signs: BP (!) 124/54   Pulse 77   Temp (!) 100.5 F (38.1 C) (Oral)   Resp 13   Ht '5\' 11"'$  (1.803 m)   Wt (!) 153.8 kg   SpO2 96%   BMI 47.28 kg/m    General: Awake, moderate distress.  CV:  RRR.  Good peripheral perfusion.  Resp:  Normal effort.  CTA B. Abd:  Nontender.  No distention.  Other:  No spinal tenderness to palpation.  No overlying skin breakdown, erythema or fluctuance.  Positive straight leg raise bilaterally.  Full strength  and sensation dorsiflexion and plantarflexion.  BLE lymphedema.   ED Results / Procedures / Treatments  Labs (all labs ordered are listed, but only abnormal results are displayed) Labs Reviewed  CBC WITH DIFFERENTIAL/PLATELET - Abnormal; Notable for the following components:      Result Value   Monocytes Absolute 1.1 (*)    All other components within normal limits  COMPREHENSIVE METABOLIC PANEL - Abnormal; Notable for the following components:   Glucose, Bld 100 (*)    BUN 27 (*)    All other components within normal limits  URINALYSIS, ROUTINE W REFLEX MICROSCOPIC - Abnormal; Notable for the following components:   Color, Urine YELLOW (*)    APPearance HAZY (*)    Hgb urine dipstick MODERATE (*)    Ketones, ur 5 (*)    All other components within normal limits  LACTIC ACID, PLASMA - Abnormal; Notable for the following components:   Lactic Acid, Venous 2.8 (*)    All other components within normal limits  APTT - Abnormal; Notable for the following components:   aPTT 56 (*)    All other components within normal limits  CULTURE, BLOOD (ROUTINE X 2)  CULTURE, BLOOD (ROUTINE X 2)  PROCALCITONIN  LIPASE, BLOOD  LACTIC ACID, PLASMA  PROTIME-INR  PROCALCITONIN  HIGH SENSITIVITY CRP     EKG  ED ECG REPORT I, Zaveon Gillen J, the attending physician, personally viewed and interpreted this ECG.   Date: 08/27/2021  EKG Time: 0115  Rate: 87  Rhythm: normal sinus rhythm  Axis: Normal  Intervals:none  ST&T Change: Nonspecific    RADIOLOGY I have independently visualized and interpreted patient's chest x-ray as well as noted the radiology interpretation:  Chest x-ray: No acute cardiopulmonary process  Official radiology report(s): MR Lumbar Spine W Wo Contrast  Result Date: 08/27/2021 CLINICAL DATA:  Initial evaluation for progressively worsening lower back pain, right greater than left hip and buttock pain. EXAM: MRI LUMBAR SPINE WITHOUT AND WITH CONTRAST TECHNIQUE:  Multiplanar and multiecho pulse sequences of the lumbar spine were obtained without and with intravenous  contrast. CONTRAST:  38m GADAVIST GADOBUTROL 1 MMOL/ML IV SOLN COMPARISON:  Prior MRI from 12/28/2018. FINDINGS: Segmentation: Standard. Lowest well-formed disc space labeled the L5-S1 level. Alignment: Up to 4 mm retrolisthesis of L3 on L4, progressed from previous. Alignment otherwise normal with preservation of the normal lumbar lordosis. Vertebrae: Postoperative changes from prior PLIF at L4-5. Vertebral body height maintained without acute or chronic fracture. Bone marrow signal intensity within normal limits. No discrete or worrisome osseous lesions. Prominent reactive marrow edema and enhancement present about the L3-4 interspace, favored to be degenerative in nature. Superimposed infection somewhat difficult to exclude, but felt to be unlikely. No other abnormal marrow edema or enhancement. Conus medullaris and cauda equina: Conus extends to the L1-2 level. Conus medullaris within normal limits. Nerve roots of the cauda equina mildly irregular proximal to the L3-4 level due to stenosis. Paraspinal and other soft tissues: Postoperative changes noted within the posterior paraspinous soft tissues. Paraspinous soft tissues demonstrate no other acute finding. Disc levels: L1-2: Negative interspace. Minimal facet spurring. No canal or foraminal stenosis. L2-3: Negative interspace. Mild facet hypertrophy. No significant canal or foraminal stenosis. L3-4: Progressive degenerative intervertebral disc space narrowing and 4 mm retrolisthesis. Associated diffuse disc bulge with reactive endplate spurring. Superimposed right subarticular disc protrusion with inferior migration (series 8, images 26, 27). Moderate facet and ligament flavum hypertrophy with associated small joint effusions. Resultant severe canal with severe right worse than left subarticular stenosis. Moderate right with severe left L3 foraminal  narrowing. L4-5: Prior PLIF. No residual spinal stenosis. Residual mild to moderate left with mild right foraminal narrowing. L5-S1: Negative interspace. Moderate right with mild left facet hypertrophy. No spinal stenosis. Foramina remain patent. IMPRESSION: 1. Progressive degenerative spondylosis at L3-4 with resultant severe canal with right worse than left subarticular stenosis, with moderate right and severe left L3 foraminal narrowing. Prominent reactive marrow edema and enhancement at this level favored to be degenerative in nature. Superimposed infection somewhat difficult to exclude, but felt to be unlikely. Correlation with any potential symptomatology and laboratory values recommended as warranted. 2. Prior PLIF at L4-5 without residual spinal stenosis. Electronically Signed   By: BJeannine BogaM.D.   On: 08/27/2021 00:56   DG Chest Port 1 View  Result Date: 08/26/2021 CLINICAL DATA:  Sepsis EXAM: PORTABLE CHEST 1 VIEW COMPARISON:  None Available. FINDINGS: The heart size and mediastinal contours are within normal limits. Both lungs are clear. The visualized skeletal structures are unremarkable. IMPRESSION: No active disease. Electronically Signed   By: AFidela SalisburyM.D.   On: 08/26/2021 23:46     PROCEDURES:  Critical Care performed: Yes, see critical care procedure note(s)  CRITICAL CARE Performed by: SPaulette Blanch  Total critical care time: 30 minutes  Critical care time was exclusive of separately billable procedures and treating other patients.  Critical care was necessary to treat or prevent imminent or life-threatening deterioration.  Critical care was time spent personally by me on the following activities: development of treatment plan with patient and/or surrogate as well as nursing, discussions with consultants, evaluation of patient's response to treatment, examination of patient, obtaining history from patient or surrogate, ordering and performing treatments and  interventions, ordering and review of laboratory studies, ordering and review of radiographic studies, pulse oximetry and re-evaluation of patient's condition.    .Marland Kitchen-3 Lead EKG Interpretation  Performed by: SPaulette Blanch MD Authorized by: SPaulette Blanch MD     Interpretation: normal     ECG rate:  99  ECG rate assessment: normal     Rhythm: sinus rhythm     Ectopy: none     Conduction: normal   Comments:     Patient placed on cardiac monitor to evaluate for arrhythmias    MEDICATIONS ORDERED IN ED: Medications  vancomycin (VANCOCIN) IVPB 1000 mg/200 mL premix (1,000 mg Intravenous New Bag/Given 08/27/21 0112)    Followed by  vancomycin (VANCOREADY) IVPB 1500 mg/300 mL (1,500 mg Intravenous New Bag/Given 08/27/21 0148)  lactated ringers bolus 1,000 mL (1,000 mLs Intravenous New Bag/Given 08/27/21 0105)    And  lactated ringers bolus 1,000 mL (1,000 mLs Intravenous New Bag/Given 08/27/21 0105)    And  lactated ringers bolus 400 mL (400 mLs Intravenous New Bag/Given 08/27/21 0205)  ceFEPIme (MAXIPIME) 2 g in sodium chloride 0.9 % 100 mL IVPB (0 g Intravenous Stopped 08/27/21 0133)  metroNIDAZOLE (FLAGYL) IVPB 500 mg (0 mg Intravenous Stopped 08/27/21 0104)  HYDROmorphone (DILAUDID) injection 0.5 mg (0.5 mg Intravenous Given 08/27/21 0030)  diazepam (VALIUM) injection 2.5 mg (2.5 mg Intravenous Given 08/27/21 0030)  acetaminophen (TYLENOL) tablet 1,000 mg (1,000 mg Oral Given 08/27/21 0029)     IMPRESSION / MDM / ASSESSMENT AND PLAN / ED COURSE  I reviewed the triage vital signs and the nursing notes.                             61 year old male presenting with low back pain times several weeks, now with low-grade temperature.  Differential diagnosis includes but is not limited to sepsis, UTI, musculoskeletal etiology, spinal infection, cauda equina syndrome, etc.  I have personally reviewed patient's records and note his lumbar fusion and hospitalization from 02/16/2020.  I have noted his  MRI was performed yesterday but has not yet been read by radiology.  Patient's presentation is most consistent with acute presentation with potential threat to life or bodily function.  The patient is on the cardiac monitor to evaluate for evidence of arrhythmia and/or significant heart rate changes.  Laboratory results demonstrate normal WBC 10.2, normal electrolytes, lactate 2.8. Procalcitonin pending. Blood and urine cultures drawn.  Will call [facility] radiology for stat read on patient's MRI performed yesterday afternoon.  If no spinal infection, plan to admit patient to our facility.  Clinical Course as of 08/27/21 0226  Wed Aug 27, 2021  0059 MRI read by Dr. Jeannine Boga: 1. Progressive degenerative spondylosis at L3-4 with resultant severe canal with right worse than left subarticular stenosis, with moderate right and severe left L3 foraminal narrowing. Prominent reactive marrow edema and enhancement at this level favored to be degenerative in nature. Superimposed infection somewhat difficult to exclude, but felt to be unlikely. Correlation with any potential symptomatology and laboratory values recommended as warranted. 2. Prior PLIF at L4-5 without residual spinal stenosis.   [JS]  0110 Dated patient and spouse of MRI results.  Have discussed with hospitalist services for evaluation and admission. [JS]    Clinical Course User Index [JS] Paulette Blanch, MD     FINAL CLINICAL IMPRESSION(S) / ED DIAGNOSES   Final diagnoses:  Sepsis, due to unspecified organism, unspecified whether acute organ dysfunction present ([place])  Fever, unspecified fever cause  Acute bilateral low back pain without sciatica  Intractable low back pain     Rx / DC Orders   ED Discharge Orders     None        Note:  This document was prepared using Dragon voice  recognition software and may include unintentional dictation errors.   Paulette Blanch, MD 08/27/21 [phone]

## 2021-08-26 NOTE — ED Triage Notes (Signed)
Pt brought in via ems from home.  Pt has lower  back pain for several weeks. .  Pt had an mri today.  Pt taking hydrocodone with some relief.  Pt denies urinary sx.  Pt alert.

## 2021-08-26 NOTE — Progress Notes (Signed)
CODE SEPSIS - PHARMACY COMMUNICATION  **Broad Spectrum Antibiotics should be administered within 1 hour of Sepsis diagnosis**  Time Code Sepsis Called/Page Received: 2352  Antibiotics Ordered: Cefepime, Vancomycin, Flagyl  Time of 1st antibiotic administration: 0100, hospital experienced power outage and running on back up power for ~ 90 minutes, slowing operations and resulting in slight delay starting 1st abx dose.  Renda Rolls, PharmD, [facility] 08/26/2021 11:58 PM

## 2021-08-27 ENCOUNTER — Inpatient Hospital Stay (HOSPITAL_COMMUNITY)
Admission: AD | Admit: 2021-08-27 | Discharge: 2021-08-30 | DRG: 454 | Disposition: A | Discharge status: Home Health | Payer: BC Managed Care – PPO | Source: Other Acute Inpatient Hospital | Attending: Internal Medicine | Admitting: Internal Medicine

## 2021-08-27 ENCOUNTER — Observation Stay: Payer: BC Managed Care – PPO

## 2021-08-27 ENCOUNTER — Observation Stay
Admit: 2021-08-27 | Discharge: 2021-08-27 | Disposition: A | Discharge status: Home / Self Care | Payer: BC Managed Care – PPO | Attending: Internal Medicine | Admitting: Internal Medicine

## 2021-08-27 DIAGNOSIS — R6520 Severe sepsis without septic shock: Secondary | ICD-10-CM | POA: Diagnosis not present

## 2021-08-27 DIAGNOSIS — M5442 Lumbago with sciatica, left side: Secondary | ICD-10-CM | POA: Diagnosis not present

## 2021-08-27 DIAGNOSIS — Z79899 Other long term (current) drug therapy: Secondary | ICD-10-CM | POA: Diagnosis not present

## 2021-08-27 DIAGNOSIS — Z6841 Body Mass Index (BMI) 40.0 and over, adult: Secondary | ICD-10-CM

## 2021-08-27 DIAGNOSIS — K5900 Constipation, unspecified: Secondary | ICD-10-CM | POA: Diagnosis present

## 2021-08-27 DIAGNOSIS — M4316 Spondylolisthesis, lumbar region: Secondary | ICD-10-CM

## 2021-08-27 DIAGNOSIS — G4730 Sleep apnea, unspecified: Secondary | ICD-10-CM | POA: Diagnosis present

## 2021-08-27 DIAGNOSIS — Z85828 Personal history of other malignant neoplasm of skin: Secondary | ICD-10-CM

## 2021-08-27 DIAGNOSIS — M5459 Other low back pain: Secondary | ICD-10-CM | POA: Diagnosis not present

## 2021-08-27 DIAGNOSIS — N400 Enlarged prostate without lower urinary tract symptoms: Secondary | ICD-10-CM | POA: Diagnosis not present

## 2021-08-27 DIAGNOSIS — Z7982 Long term (current) use of aspirin: Secondary | ICD-10-CM

## 2021-08-27 DIAGNOSIS — M1990 Unspecified osteoarthritis, unspecified site: Secondary | ICD-10-CM | POA: Diagnosis not present

## 2021-08-27 DIAGNOSIS — M4302 Spondylolysis, cervical region: Secondary | ICD-10-CM | POA: Diagnosis present

## 2021-08-27 DIAGNOSIS — Z01818 Encounter for other preprocedural examination: Secondary | ICD-10-CM | POA: Diagnosis not present

## 2021-08-27 DIAGNOSIS — K219 Gastro-esophageal reflux disease without esophagitis: Secondary | ICD-10-CM | POA: Diagnosis present

## 2021-08-27 DIAGNOSIS — Z981 Arthrodesis status: Secondary | ICD-10-CM

## 2021-08-27 DIAGNOSIS — M5116 Intervertebral disc disorders with radiculopathy, lumbar region: Secondary | ICD-10-CM | POA: Diagnosis present

## 2021-08-27 DIAGNOSIS — I10 Essential (primary) hypertension: Secondary | ICD-10-CM | POA: Diagnosis not present

## 2021-08-27 DIAGNOSIS — I2510 Atherosclerotic heart disease of native coronary artery without angina pectoris: Secondary | ICD-10-CM | POA: Diagnosis present

## 2021-08-27 DIAGNOSIS — A419 Sepsis, unspecified organism: Secondary | ICD-10-CM

## 2021-08-27 DIAGNOSIS — M5441 Lumbago with sciatica, right side: Secondary | ICD-10-CM | POA: Diagnosis not present

## 2021-08-27 DIAGNOSIS — M545 Low back pain, unspecified: Secondary | ICD-10-CM | POA: Diagnosis not present

## 2021-08-27 DIAGNOSIS — Z713 Dietary counseling and surveillance: Secondary | ICD-10-CM

## 2021-08-27 DIAGNOSIS — R600 Localized edema: Secondary | ICD-10-CM | POA: Diagnosis not present

## 2021-08-27 DIAGNOSIS — G8929 Other chronic pain: Secondary | ICD-10-CM | POA: Diagnosis present

## 2021-08-27 DIAGNOSIS — G4733 Obstructive sleep apnea (adult) (pediatric): Secondary | ICD-10-CM | POA: Diagnosis not present

## 2021-08-27 DIAGNOSIS — Z87891 Personal history of nicotine dependence: Secondary | ICD-10-CM | POA: Diagnosis not present

## 2021-08-27 DIAGNOSIS — R509 Fever, unspecified: Secondary | ICD-10-CM | POA: Diagnosis not present

## 2021-08-27 DIAGNOSIS — M47816 Spondylosis without myelopathy or radiculopathy, lumbar region: Secondary | ICD-10-CM

## 2021-08-27 DIAGNOSIS — M5136 Other intervertebral disc degeneration, lumbar region: Secondary | ICD-10-CM | POA: Diagnosis present

## 2021-08-27 DIAGNOSIS — M48062 Spinal stenosis, lumbar region with neurogenic claudication: Principal | ICD-10-CM | POA: Diagnosis present

## 2021-08-27 DIAGNOSIS — M47817 Spondylosis without myelopathy or radiculopathy, lumbosacral region: Secondary | ICD-10-CM | POA: Diagnosis present

## 2021-08-27 DIAGNOSIS — R531 Weakness: Secondary | ICD-10-CM | POA: Diagnosis not present

## 2021-08-27 DIAGNOSIS — R2243 Localized swelling, mass and lump, lower limb, bilateral: Secondary | ICD-10-CM | POA: Diagnosis not present

## 2021-08-27 DIAGNOSIS — Z7951 Long term (current) use of inhaled steroids: Secondary | ICD-10-CM

## 2021-08-27 DIAGNOSIS — M542 Cervicalgia: Secondary | ICD-10-CM | POA: Diagnosis present

## 2021-08-27 DIAGNOSIS — M48061 Spinal stenosis, lumbar region without neurogenic claudication: Secondary | ICD-10-CM | POA: Diagnosis not present

## 2021-08-27 LAB — COMPREHENSIVE METABOLIC PANEL
ALT: 13 U/L (ref 0–44)
AST: 14 U/L — ABNORMAL LOW (ref 15–41)
Albumin: 3.3 g/dL — ABNORMAL LOW (ref 3.5–5.0)
Alkaline Phosphatase: 55 U/L (ref 38–126)
Anion gap: 9 (ref 5–15)
BUN: 22 mg/dL — ABNORMAL HIGH (ref 6–20)
CO2: 27 mmol/L (ref 22–32)
Calcium: 8.7 mg/dL — ABNORMAL LOW (ref 8.9–10.3)
Chloride: 103 mmol/L (ref 98–111)
Creatinine, Ser: 0.59 mg/dL — ABNORMAL LOW (ref 0.61–1.24)
GFR, Estimated: >60 mL/min (ref >60)
Glucose, Bld: 99 mg/dL (ref 70–99)
Potassium: 3.5 mmol/L (ref 3.5–5.1)
Sodium: 139 mmol/L (ref 135–145)
Total Bilirubin: 0.5 mg/dL (ref 0.3–1.2)
Total Protein: 6.4 g/dL — ABNORMAL LOW (ref 6.5–8.1)

## 2021-08-27 LAB — HIV ANTIBODY (ROUTINE TESTING W REFLEX): HIV Screen 4th Generation wRfx: NONREACTIVE

## 2021-08-27 LAB — PROCALCITONIN
Procalcitonin: <0.1 ng/mL
Procalcitonin: <0.1 ng/mL

## 2021-08-27 LAB — CBC
HCT: 36.9 % — ABNORMAL LOW (ref 39.0–52.0)
Hemoglobin: 11.7 g/dL — ABNORMAL LOW (ref 13.0–17.0)
MCH: 27.9 pg (ref 26.0–34.0)
MCHC: 31.7 g/dL (ref 30.0–36.0)
MCV: 87.9 fL (ref 80.0–100.0)
Platelets: 255 10*3/uL (ref 150–400)
RBC: 4.2 MIL/uL — ABNORMAL LOW (ref 4.22–5.81)
RDW: 13 % (ref 11.5–15.5)
WBC: 9 10*3/uL (ref 4.0–10.5)
nRBC: 0 % (ref 0.0–0.2)

## 2021-08-27 LAB — PROTIME-INR
INR: 1.2 (ref 0.8–1.2)
Prothrombin Time: 14.6 seconds (ref 11.4–15.2)

## 2021-08-27 LAB — TSH: TSH: 0.809 u[IU]/mL (ref 0.350–4.500)

## 2021-08-27 LAB — LACTIC ACID, PLASMA: Lactic Acid, Venous: 1.6 mmol/L (ref 0.5–1.9)

## 2021-08-27 LAB — APTT: aPTT: 56 seconds — ABNORMAL HIGH (ref 24–36)

## 2021-08-27 LAB — SURGICAL PCR SCREEN
MRSA, PCR: NEGATIVE
Staphylococcus aureus: NEGATIVE

## 2021-08-27 MED ORDER — LOSARTAN POTASSIUM-HCTZ 100-12.5 MG PO TABS: 1.0000 | ORAL_TABLET | Freq: Every day | ORAL | Status: DC

## 2021-08-27 MED ORDER — SODIUM CHLORIDE 0.9% FLUSH
3.0000 mL | Freq: Two times a day (BID) | INTRAVENOUS | Status: DC
  Administered 2021-08-27: 3 mL via INTRAVENOUS

## 2021-08-27 MED ORDER — MUPIROCIN 2 % EX OINT
1.0000 | TOPICAL_OINTMENT | Freq: Two times a day (BID) | CUTANEOUS | Status: DC
  Filled 2021-08-27: qty 22

## 2021-08-27 MED ORDER — MORPHINE SULFATE (PF) 2 MG/ML IV SOLN: 2.0000 mg | Freq: Four times a day (QID) | INTRAVENOUS | Status: DC | PRN

## 2021-08-27 MED ORDER — HYDRALAZINE HCL 20 MG/ML IJ SOLN: 10.0000 mg | Freq: Four times a day (QID) | INTRAMUSCULAR | Status: DC | PRN

## 2021-08-27 MED ORDER — HYDROCHLOROTHIAZIDE 12.5 MG PO TABS: 12.5000 mg | ORAL_TABLET | Freq: Every day | ORAL | Status: DC

## 2021-08-27 MED ORDER — FINASTERIDE 5 MG PO TABS
5.0000 mg | ORAL_TABLET | Freq: Every day | ORAL | Status: DC
  Filled 2021-08-27: qty 1

## 2021-08-27 MED ORDER — HYDRALAZINE HCL 20 MG/ML IJ SOLN: 5.0000 mg | INTRAMUSCULAR | Status: DC | PRN

## 2021-08-27 MED ORDER — SENNOSIDES-DOCUSATE SODIUM 8.6-50 MG PO TABS: 1.0000 | ORAL_TABLET | Freq: Every day | ORAL | Status: DC

## 2021-08-27 MED ORDER — FINASTERIDE 5 MG PO TABS
5.0000 mg | ORAL_TABLET | Freq: Every day | ORAL | Status: DC
  Administered 2021-08-27: 5 mg via ORAL
  Filled 2021-08-27: qty 1

## 2021-08-27 MED ORDER — HEPARIN SODIUM (PORCINE) 5000 UNIT/ML IJ SOLN
5000.0000 [IU] | Freq: Three times a day (TID) | INTRAMUSCULAR | Status: DC
  Administered 2021-08-27: 5000 [IU] via SUBCUTANEOUS
  Filled 2021-08-27 (×2): qty 1

## 2021-08-27 MED ORDER — POLYETHYLENE GLYCOL 3350 17 G PO PACK: 17.0000 g | PACK | Freq: Every day | ORAL | 0 refills | Status: DC | PRN

## 2021-08-27 MED ORDER — ACETAMINOPHEN 325 MG PO TABS: 650.0000 mg | ORAL_TABLET | Freq: Four times a day (QID) | ORAL | Status: DC | PRN

## 2021-08-27 MED ORDER — ASPIRIN 81 MG PO TBEC: 81.0000 mg | DELAYED_RELEASE_TABLET | Freq: Every day | ORAL | Status: DC

## 2021-08-27 MED ORDER — HYDROCODONE-ACETAMINOPHEN 5-325 MG PO TABS
1.0000 | ORAL_TABLET | ORAL | Status: DC | PRN
  Filled 2021-08-27: qty 1

## 2021-08-27 MED ORDER — PRAVASTATIN SODIUM 20 MG PO TABS: 80.0000 mg | ORAL_TABLET | Freq: Every day | ORAL | Status: DC

## 2021-08-27 MED ORDER — TAMSULOSIN HCL 0.4 MG PO CAPS: 0.4000 mg | ORAL_CAPSULE | Freq: Every day | ORAL | Status: DC

## 2021-08-27 MED ORDER — HYDROCODONE-ACETAMINOPHEN 5-325 MG PO TABS: 1.0000 | ORAL_TABLET | ORAL | Status: DC | PRN

## 2021-08-27 MED ORDER — LOSARTAN POTASSIUM 50 MG PO TABS: 100.0000 mg | ORAL_TABLET | Freq: Every day | ORAL | Status: DC

## 2021-08-27 MED ORDER — HEPARIN SODIUM (PORCINE) 5000 UNIT/ML IJ SOLN
5000.0000 [IU] | Freq: Three times a day (TID) | INTRAMUSCULAR | Status: DC
  Administered 2021-08-27 – 2021-08-28 (×2): 5000 [IU] via SUBCUTANEOUS
  Filled 2021-08-27 (×2): qty 1

## 2021-08-27 MED ORDER — VANCOMYCIN HCL 1750 MG/350ML IV SOLN
1750.0000 mg | Freq: Two times a day (BID) | INTRAVENOUS | Status: DC
  Administered 2021-08-28 – 2021-08-30 (×5): 1750 mg via INTRAVENOUS
  Filled 2021-08-27 (×6): qty 350

## 2021-08-27 MED ORDER — MUPIROCIN 2 % EX OINT
1.0000 | TOPICAL_OINTMENT | Freq: Two times a day (BID) | CUTANEOUS | Status: DC
  Administered 2021-08-27: 1 via NASAL
  Filled 2021-08-27: qty 22

## 2021-08-27 MED ORDER — ACETAMINOPHEN 650 MG RE SUPP: 650.0000 mg | Freq: Four times a day (QID) | RECTAL | Status: DC | PRN

## 2021-08-27 MED ORDER — POLYETHYLENE GLYCOL 3350 17 G PO PACK: 17.0000 g | PACK | Freq: Every day | ORAL | Status: DC | PRN

## 2021-08-27 MED ORDER — HYDRALAZINE HCL 20 MG/ML IJ SOLN: 10.0000 mg | Freq: Once | INTRAMUSCULAR | Status: DC

## 2021-08-27 MED ORDER — TAMSULOSIN HCL 0.4 MG PO CAPS
0.4000 mg | ORAL_CAPSULE | Freq: Every day | ORAL | Status: DC
  Administered 2021-08-27: 0.4 mg via ORAL
  Filled 2021-08-27: qty 1

## 2021-08-27 MED ORDER — PRAVASTATIN SODIUM 20 MG PO TABS
80.0000 mg | ORAL_TABLET | Freq: Every day | ORAL | Status: DC
  Administered 2021-08-27: 80 mg via ORAL
  Filled 2021-08-27: qty 4

## 2021-08-27 MED ORDER — VANCOMYCIN HCL 1750 MG/350ML IV SOLN
1750.0000 mg | Freq: Two times a day (BID) | INTRAVENOUS | Status: DC
  Administered 2021-08-27: 1750 mg via INTRAVENOUS
  Filled 2021-08-27: qty 350

## 2021-08-27 MED ORDER — HEPARIN SODIUM (PORCINE) 5000 UNIT/ML IJ SOLN
5000.0000 [IU] | Freq: Three times a day (TID) | INTRAMUSCULAR | Status: DC
  Administered 2021-08-27: 5000 [IU] via SUBCUTANEOUS
  Filled 2021-08-27: qty 1

## 2021-08-27 MED ORDER — SODIUM CHLORIDE 0.9 % IV SOLN: INTRAVENOUS | Status: DC

## 2021-08-27 NOTE — Assessment & Plan Note (Signed)
Will defer to PCP for weight loss options.

## 2021-08-27 NOTE — Progress Notes (Signed)
PROGRESS NOTE  Terry Horn.  DOB: 07/18/1960  PCP: Jerrol Banana., MD IWL:[id number]  DOA: 08/26/2021  LOS: 0 days  Hospital Day: 2  Brief narrative: Terry Horn. is a 61 y.o. male with PMH significant for morbid obesity, OSA on CPAP, HTN, CAD, GERD, arthritis. Patient was brought to ED from home on 6/27 for progressively worsening low back pain.  December 2021, patient underwent, L4-L5 decompression fusion for lumbar radiculopathy history of stage, lumbar facet arthropathy. For the past several weeks, patient has intermittently severe progressively worsening low back pain.  He was seen by Dr. Arnoldo Morale and underwent MRI lumbar spine on 6/27.  It showed progressive degenerative spondylosis at L3-4 with resultant severe canal with right worse than left subarticular stenosis, with moderate right and severe left L3 foraminal narrowing. Prominent reactive marrow edema and enhancement at this level favored to be degenerative in nature. Superimposed infection was less likely but could not rule out. Because of the level of severity of his symptoms and resultant ambulatory dysfunction, he was sent to the ED for further evaluation management.  In the ED, he had a temperature of 100.5 Labs with unremarkable CBC, lactic acid elevated to 2.8, BMP unremarkable. Urinalysis showed yellow hazy urine with moderate amount of hemoglobin Patient started on broad-spectrum IV antibiotics Admitted to hospitalist service  Subjective: Patient was seen and examined this morning.  Pleasant middle-aged Caucasian male.  Lying on bed.  Not in distress.  Pain controlled at this time.  Wife at bedside. No fever since admission.  Hemodynamically stable Repeat labs this morning unremarkable.  Lactic acid level normalized.  Assessment and plan: Severe spinal canal stenosis Prior history of L4-L5 decompression -Patient with chronic low back pain with prior surgical intervention  -Now presenting  with progressively worsening intermittent severe low back pain  -Follows up with Dr. Arnoldo Morale as an outpatient.  MRI findings as above.   -Since the MRI was not able to rule out any infection, patient is currently getting covered with broad-spectrum antibiotics. -I called and discussed with neurosurgeon Dr. Arnoldo Morale this morning.  Recommended transfer to [facility].  May need surgical intervention.  I will keep him n.p.o. overnight.  Suspected sepsis -On arrival, patient had temperature 100.5, lactic acid elevated 2.8 but with normal leukocytes. -Potential source of infection per MRI lumbar spine because of the presence of prominent reactive marrow edema. -Continue IV vancomycin for now -Continue to monitor Recent Labs  Lab 08/26/21 2212 08/26/21 2332 08/27/21 0324 08/27/21 0513  WBC 10.2  --   --  9.0  LATICACIDVEN 2.8* 1.6  --   --   PROCALCITON <0.10  --  <0.10  --    Essential hypertension -Currently on losartan and HCTZ. -With a tentative plan of surgery tomorrow, I would keep these medicines on hold -Hydralazine IV as needed.  Continue to monitor blood pressure  Bilateral lower extremity edema -No evidence of DVT. -Ordered for echocardiogram.  Last echo from 2017 with EF 55 to 65%.  History of CAD -Currently no anginal symptoms -Currently on aspirin and statin. -I will keep aspirin on hold in progression of neurosurgery tomorrow.    Morbid obesity  -Body mass index is 47.28 kg/m. Patient has been advised to make an attempt to improve diet and exercise patterns to aid in weight loss.  OSA -Nightly CPAP.  Encouraged to bring his own CPAP machine from home.  BPH -Flomax, finasteride   Constipation -No BM in last 3 days.  Probably due to poor mobility and use of pain medicines. -Start Senokot and as needed laxative.    Goals of care   Code Status: Full Code    Mobility: Needs PT eval postprocedure  Skin assessment:     Nutritional status:  Body mass index is  47.28 kg/m.          Diet:  Diet Order             Diet NPO time specified  Diet effective midnight           Diet heart healthy/carb modified Room service appropriate? Yes; Fluid consistency: Thin  Diet effective now                   DVT prophylaxis:  heparin injection 5,000 Units Start: 08/27/21 0600   Antimicrobials: IV vancomycin Fluid: NS at 15 mill per hour Consultants: Neurosurgery Family Communication: Wife at bedside  Status is: Observation  Continue in-hospital care because: Transfer to [facility] per neurosurgery recommendation Level of care: Med-Surg   Dispo: The patient is from: Home              Anticipated d/c is to: Pending clinical course              Patient currently is not medically stable to d/c.   Difficult to place patient No     Infusions:   sodium chloride 50 mL/hr at 08/27/21 0340    Scheduled Meds:  finasteride  5 mg Oral Daily   heparin  5,000 Units Subcutaneous Q8H   mupirocin ointment  1 Application Nasal BID   pravastatin  80 mg Oral Daily   sodium chloride flush  3 mL Intravenous Q12H   tamsulosin  0.4 mg Oral Daily    PRN meds: acetaminophen **OR** acetaminophen, hydrALAZINE, HYDROcodone-acetaminophen, morphine injection   Antimicrobials: Anti-infectives (From admission, onward)    Start     Dose/Rate Route Frequency Ordered Stop   08/27/21 0130  vancomycin (VANCOREADY) IVPB 1500 mg/300 mL       See Hyperspace for full Linked Orders Report.   1,500 mg 150 mL/hr over 120 Minutes Intravenous  Once 08/26/21 2357 08/27/21 0417   08/27/21 0030  vancomycin (VANCOCIN) IVPB 1000 mg/200 mL premix       See Hyperspace for full Linked Orders Report.   1,000 mg 200 mL/hr over 60 Minutes Intravenous  Once 08/26/21 2357 08/27/21 0255   08/26/21 2345  ceFEPIme (MAXIPIME) 2 g in sodium chloride 0.9 % 100 mL IVPB        2 g 200 mL/hr over 30 Minutes Intravenous  Once 08/26/21 2344 08/27/21 0133   08/26/21 2345  metroNIDAZOLE  (FLAGYL) IVPB 500 mg        500 mg 100 mL/hr over 60 Minutes Intravenous  Once 08/26/21 2344 08/27/21 0104   08/26/21 2345  vancomycin (VANCOCIN) IVPB 1000 mg/200 mL premix  Status:  Discontinued        1,000 mg 200 mL/hr over 60 Minutes Intravenous  Once 08/26/21 2344 08/26/21 2357       Objective: Vitals:   08/27/21 0657 08/27/21 0725  BP: 117/66 107/62  Pulse: 81 78  Resp: 17 17  Temp: 98.3 F (36.8 C) 98.5 F (36.9 C)  SpO2: 99% 98%    Intake/Output Summary (Last 24 hours) at 08/27/2021 1252 Last data filed at 08/27/2021 1105 Gross per 24 hour  Intake 120 ml  Output 825 ml  Net -705 ml   [employer]  08/26/21 2130  Weight: (!) 153.8 kg   Weight change:  Body mass index is 47.28 kg/m.   Physical Exam: General exam: Pleasant, middle-aged Caucasian male.  Not in physical distress at rest Skin: No rashes, lesions or ulcers. HEENT: Atraumatic, normocephalic, no obvious bleeding Lungs: Clear to auscultation bilaterally CVS: Regular rate and rhythm, no murmur GI/Abd soft, nontender, nondistended, bowel sound present CNS: Alert, awake, oriented x3 Psychiatry: Sad affect Extremities: Puffy bilateral lower extremities with trace pedal edema.    Data Review: I have personally reviewed the laboratory data and studies available.  F/u labs ordered Unresulted Labs (From admission, onward)     Start     Ordered   08/28/21 0500  CBC with Differential/Platelet  Tomorrow morning,   R        08/27/21 1252   08/28/21 1610  Basic metabolic panel  Tomorrow morning,   R        08/27/21 1252   08/27/21 0727  Surgical PCR screen  Once,   R        08/27/21 0726   08/27/21 0500  Procalcitonin  Daily,   URGENT      08/26/21 2137   08/27/21 0257  Urinalysis, Complete w Microscopic Urine, Clean Catch  Once,   R        08/27/21 0256   08/27/21 0257  Urine Culture  (Urine Culture)  Once,   R       Question:  Indication  Answer:  Sepsis   08/27/21 0256   08/27/21 0158  High  sensitivity CRP  Add-on,   AD        08/27/21 0157            Signed, Terrilee Croak, MD [facility] 08/27/2021

## 2021-08-27 NOTE — Consult Note (Signed)
Pharmacy Antibiotic Note  Terry Horn. is a 61 y.o. male admitted on 08/26/2021 with history of L4-L5 decompression and MRI of L spine is not able to rule out possible infection. Pharmacy has been consulted for empiric Vancomycin dosing. Plan to transfer patient to [facility] for possible surgical intervention.   Plan: Vancomycin 2500 mg IV loading dose, followed by vancomycin 1750 Goal AUC 400-550  Est AUC: 499 Est Cmax: 34.2 Est Cmin: 13.7 Calculated with SCr 0.8 (rounded up), Vd 0.5 (BMI 47)  Monitor clinical picture, renal function, and vancomycin levels at steady state F/U C&S, abx deescalation / LOT   Height: '5\' 11"'$  (180.3 cm) Weight: (!) 153.8 kg (339 lb) IBW/kg (Calculated) : 75.3  Temp (24hrs), Avg:98.9 F (37.2 C), Min:98.3 F (36.8 C), Max:100.5 F (38.1 C)  Recent Labs  Lab 08/26/21 2212 08/26/21 2332 08/27/21 0513  WBC 10.2  --  9.0  CREATININE 0.75  --  0.59*  LATICACIDVEN 2.8* 1.6  --     Estimated Creatinine Clearance: 148.2 mL/min (A) (by C-G formula based on SCr of 0.59 mg/dL (L)).    No Known Allergies  Antimicrobials this admission: 6/28 Flagyl x 1 6/28 cefepime x 1  6/28 vancomycin >>   Dose adjustments this admission:   Microbiology results: 6/27 BCx: NG <12H 6/27 UCx: pending  6/28 MRSA PCR: pending  Thank you for allowing pharmacy to be a part of this patient's care.  Darnelle Bos, PharmD 08/27/2021 1:19 PM

## 2021-08-27 NOTE — Assessment & Plan Note (Signed)
Blood pressure (!) 124/54, pulse 77, temperature (!) 100.5 F (38.1 C), temperature source Oral, resp. rate 13, height '5\' 11"'$  (1.803 m), weight (!) 153.8 kg, SpO2 96 %. cont pt on losartan hctz. Kidney function is 0.75.

## 2021-08-27 NOTE — Assessment & Plan Note (Addendum)
Blood pressure (!) 124/54, pulse 77, temperature (!) 100.5 F (38.1 C), temperature source Oral, resp. rate 13, height '5\' 11"'$  (1.803 m), weight (!) 153.8 kg, SpO2 96 %. and elevated lactic acid Pt meets severe sepsis criteria.  We will cont with IV abx.  Follow C/S. source still not certain. Urine culture to be collected.

## 2021-08-27 NOTE — Sepsis Progress Note (Signed)
Following for sepsis monitoring ?

## 2021-08-27 NOTE — H&P (Signed)
History and Physical    Terry Horn. MVE:[id number] DOB: June 26, 1960 DOA: 08/27/2021  PCP: Jerrol Banana., MD   Chief Complaint: Lumbago  HPI:  Terry Horn. is a 61 y.o. male with PMH significant for morbid obesity, OSA on CPAP, HTN, CAD, GERD, arthritis.  Patient was brought to ED from home on 6/27 for progressively worsening low back pain.  December 2021, patient underwent, L4-L5 decompression fusion for lumbar radiculopathy history of stage, lumbar facet arthropathy. For the past several weeks, patient has intermittently severe progressively worsening low back pain.  He was seen by Dr. Arnoldo Morale and underwent MRI lumbar spine on 6/27.  It showed progressive degenerative spondylosis at L3-4 with resultant severe canal with right worse than left subarticular stenosis, with moderate right and severe left L3 foraminal narrowing. Prominent reactive marrow edema and enhancement at this level favored to be degenerative in nature. Superimposed infection was less likely but could not rule out. Patient met sepsis criteria at intake - started on antibiotics and hospitalist called for admission.  Patient transferred to main campus for neurosurgical evaluation/possible interventions  Assessment/Plan Principal Problem:   Severe sepsis ([place]) Active Problems:   Cervicalgia   Spondylosis without myelopathy or radiculopathy, lumbosacral region   Lumbar facet arthropathy (L3-4, L4-L5, and L5-S1) (Bilateral)   Apnea, sleep   Body mass index 45.0-49.9, adult (HCC)   Arthritis   Chronic low back pain (Primary Area of Pain) (Bilateral) w/ sciatica (Left)   DDD (degenerative disc disease), lumbar   Cervical spondylolysis   Severe spinal canal stenosis Prior history of L4-L5 decompression - Patient with chronic low back pain with prior surgical intervention  - Now presenting with progressively worsening intermittent severe low back pain  - Follows up with Dr. Arnoldo Morale as an  outpatient.  MRI findings as below - Given SIRS criteria and unable to rule out acute infection of the spine will continue broad spectrum abx as below - Dr. Arnoldo Morale aware   Suspected severe sepsis Rule out osteo/intraspinal infection - Febrile to 100.5, lactic acid elevated 2.8 and tachycardic at intake (Previous facility) - Potential source of infection per MRI lumbar spine because of the presence of prominent reactive marrow edema. - Continue IV vancomycin for now - Continue to monitor   Essential hypertension - Hold home medications - currently well controlled - IV hydralazine perioperatively as needed   Bilateral lower extremity edema -No evidence of DVT. -Ordered for echocardiogram.  Last echo from 2017 with EF 55 to 65%.   History of CAD -Currently no anginal symptoms -Currently on aspirin and statin. -I will keep aspirin on hold in progression of neurosurgery tomorrow.  Morbid obesity  -Body mass index is 47.28 kg/m. Patient has been advised to make an attempt to improve diet and exercise patterns to aid in weight loss.  OSA -Nightly CPAP.  Encouraged to bring his own CPAP machine from home.  BPH -Flomax, finasteride  Constipation -No BM in last 3 days.  Probably due to poor mobility and use of pain medicines. -Start Senokot and as needed laxative.  DVT prophylaxis: Heparin  Code Status: Full  Family Communication: None present  Status is: Inpatient  Dispo: The patient is from: Home              Anticipated d/c is to: TBD              Anticipated d/c date is: TBD >72h  Patient currently NOT medically stable for discharge given ongoing need for IV abx, imaging, and neurosurgical workup/evaluation.  Consultants:  Neuro Sx  Procedures:  None  Past Medical History:  Diagnosis Date   Arthritis    joints,hands,elbow   Basal cell carcinoma    L scapula   Cancer ([place])    skin cancer removed   GERD (gastroesophageal reflux disease)     Hypertension    Pneumonia    Sleep apnea    with cpap    Past Surgical History:  Procedure Laterality Date   EXCISION OF SKIN TAG N/A 02/06/2016   Procedure: EXCISION OF SKIN TAG;  Surgeon: [name], MD;  Location: [facility] ORS;  Service: General;  Laterality: N/A;   TONSILLECTOMY       reports that he quit smoking about 24 years ago. His smoking use included cigarettes. He has never used smokeless tobacco. He reports current alcohol use. He reports that he does not use drugs.  No Known Allergies  Family History  Problem Relation Age of Onset   Bipolar disorder Mother    Thyroid disease Mother    Depression Sister    Prostate cancer Paternal Grandfather    Prostate cancer Paternal Uncle     Prior to Admission medications   Medication Sig Start Date End Date Taking? Authorizing Provider  acetaminophen (TYLENOL) 325 MG tablet Take 2 tablets (650 mg total) by mouth every 6 (six) hours as needed for mild pain, moderate pain, fever or headache (or Fever >/= 101). 08/27/21  Yes Dahal, Marlowe Aschoff, MD  aspirin 81 MG tablet Take 81 mg by mouth daily.  04/08/10  Yes [provider]  Cholecalciferol 1000 UNITS tablet Take 1,000 Units by mouth daily.  04/08/10  Yes [provider]  cyclobenzaprine (FLEXERIL) 10 MG tablet Take 1 tablet (10 mg total) by mouth at bedtime. 08/11/21  Yes Jerrol Banana., MD  Dextran 70-Hypromellose (ARTIFICIAL TEARS) 0.1-0.3 % SOLN Place 1 drop into both eyes daily as needed (Dry eye).   Yes [provider]  diclofenac (VOLTAREN) 50 MG EC tablet Take 1 tablet (50 mg total) by mouth 2 (two) times daily. 04/11/21  Yes Jerrol Banana., MD  finasteride (PROSCAR) 5 MG tablet TAKE ONE TABLET BY MOUTH DAILY 12/03/20  Yes Jerrol Banana., MD  fish oil-omega-3 fatty acids 1000 MG capsule Take 2,000 tablets by mouth daily.  04/08/10  Yes [provider]  gabapentin (NEURONTIN) 300 MG capsule TAKE ONE CAPSULE BY MOUTH EVERY  MORNING AND TAKE TWO CAPSULES BY MOUTH EVERY NIGHT AT BEDTIME 05/28/21  Yes Jerrol Banana., MD  HYDROcodone-acetaminophen [facility]) 5-325 MG tablet Take 1 tablet by mouth every 4 (four) hours as needed for moderate pain or severe pain. 08/13/21  Yes Jerrol Banana., MD  hydrocortisone 2.5 % cream Apply topically 3 (three) times a week. 3 times weekly to scaly areas brows, Tuesday, Thursday, Saturday 05/30/21  Yes Ralene Bathe, MD  losartan-hydrochlorothiazide [facility]) 100-12.5 MG tablet TAKE ONE TABLET BY MOUTH DAILY 08/25/21  Yes Jerrol Banana., MD  Misc Natural Products (GLUCOSAMINE CHONDROITIN COMPLX) TABS Take 2 tablets by mouth daily.    Yes [provider]  Multiple Vitamin tablet Take 1 tablet by mouth daily.  04/08/10  Yes [provider]  polyethylene glycol (MIRALAX / GLYCOLAX) 17 g packet Take 17 g by mouth daily as needed for moderate constipation. 08/27/21  Yes Dahal, Marlowe Aschoff, MD  pravastatin (PRAVACHOL)  80 MG tablet Take 80 mg by mouth daily. 06/28/21  Yes [provider]  Probiotic Product (PROBIOTIC DAILY) CAPS Take 1 tablet by mouth daily.   Yes [provider]  senna-docusate (SENOKOT-S) 8.6-50 MG tablet Take 1 tablet by mouth at bedtime. 08/27/21  Yes Dahal, Marlowe Aschoff, MD  tamsulosin (FLOMAX) 0.4 MG CAPS capsule TAKE ONE CAPSULE BY MOUTH DAILY 03/04/21  Yes Rumball, Jake Church, DO  Turmeric, Lear Ng, POWD Take 1 tablet by mouth daily.    Yes [provider]  vitamin C (ASCORBIC ACID) 500 MG tablet Take 500 mg by mouth daily.  04/08/10  Yes [provider]  vitamin E 400 UNIT capsule Take 400 Units by mouth daily.  04/08/10  Yes [provider]  Azelastine-Fluticasone (DYMISTA) 137-50 MCG/ACT SUSP Place 2 sprays into the nose daily. Patient not taking: Reported on 08/11/2021 04/15/17   Jerrol Banana., MD    Physical Exam: Vitals:   08/27/21 1700  BP: 119/67  Resp: 18  SpO2: 95%     Constitutional: NAD, calm, comfortable Vitals:   08/27/21 1700  BP: 119/67  Resp: 18  SpO2: 95%   General:  Pleasantly resting in bed, No acute distress. HEENT:  Normocephalic atraumatic.  Sclerae nonicteric, noninjected.  Extraocular movements intact bilaterally. Neck:  Without mass or deformity.  Trachea is midline. Lungs:  Clear to auscultate bilaterally without rhonchi, wheeze, or rales. Heart:  Regular rate and rhythm.  Without murmurs, rubs, or gallops. Abdomen:  Soft, nontender, nondistended.  Without guarding or rebound. Extremities: Without cyanosis, clubbing, edema, or obvious deformity. Vascular:  Dorsalis pedis and posterior tibial pulses palpable bilaterally. Skin:  Warm and dry, no erythema, no ulcerations.  Labs on Admission: I have personally reviewed following labs and imaging studies  CBC: Recent Labs  Lab 08/26/21 2212 08/27/21 0513  WBC 10.2 9.0  NEUTROABS 7.2  --   HGB 13.3 11.7*  HCT 42.6 36.9*  MCV 88.8 87.9  PLT 301 093   Basic Metabolic Panel: Recent Labs  Lab 08/26/21 2212 08/27/21 0513  NA 140 139  K 4.2 3.5  CL 100 103  CO2 29 27  GLUCOSE 100* 99  BUN 27* 22*  CREATININE 0.75 0.59*  CALCIUM 9.2 8.7*   GFR: Estimated Creatinine Clearance: 148.2 mL/min (A) (by C-G formula based on SCr of 0.59 mg/dL (L)). Liver Function Tests: Recent Labs  Lab 08/26/21 2212 08/27/21 0513  AST 18 14*  ALT 16 13  ALKPHOS 66 55  BILITOT 0.5 0.5  PROT 7.3 6.4*  ALBUMIN 3.7 3.3*   Recent Labs  Lab 08/26/21 2212  LIPASE 35   No results for input(s): "AMMONIA" in the last 168 hours. Coagulation Profile: Recent Labs  Lab 08/26/21 2331  INR 1.2   Cardiac Enzymes: No results for input(s): "CKTOTAL", "CKMB", "CKMBINDEX", "TROPONINI" in the last 168 hours. BNP (last 3 results) No results for input(s): "PROBNP" in the last 8760 hours. HbA1C: No results for input(s): "HGBA1C" in the last 72 hours. CBG: No results for input(s): "GLUCAP" in  the last 168 hours. Lipid Profile: No results for input(s): "CHOL", "HDL", "LDLCALC", "TRIG", "CHOLHDL", "LDLDIRECT" in the last 72 hours. Thyroid Function Tests: Recent Labs    08/27/21 0324  TSH 0.809   Anemia Panel: No results for input(s): "VITAMINB12", "FOLATE", "FERRITIN", "TIBC", "IRON", "RETICCTPCT" in the last 72 hours. Urine analysis:    Component Value Date/Time   COLORURINE YELLOW (A) 08/26/2021 2137   APPEARANCEUR HAZY (A) 08/26/2021 2137  LABSPEC 1.026 08/26/2021 2137   PHURINE 6.0 08/26/2021 2137   GLUCOSEU NEGATIVE 08/26/2021 2137   HGBUR MODERATE (A) 08/26/2021 2137   BILIRUBINUR NEGATIVE 08/26/2021 2137   BILIRUBINUR neg 08/11/2021 1031   KETONESUR 5 (A) 08/26/2021 2137   PROTEINUR NEGATIVE 08/26/2021 2137   UROBILINOGEN 2.0 (A) 08/11/2021 1031   NITRITE NEGATIVE 08/26/2021 2137   LEUKOCYTESUR NEGATIVE 08/26/2021 2137    Radiological Exams on Admission: US Venous Img Lower Bilateral (DVT)  Result Date: 08/27/2021 CLINICAL DATA:  61 year old male with bilateral lower extremity edema. EXAM: BILATERAL LOWER EXTREMITY VENOUS DOPPLER ULTRASOUND TECHNIQUE: Gray-scale sonography with compression, as well as color and duplex ultrasound, were performed to evaluate the deep venous system(s) from the level of the common femoral vein through the popliteal and proximal calf veins. COMPARISON:  None Available. FINDINGS: VENOUS Normal compressibility of the bilateral common femoral, superficial femoral, and popliteal veins, as well as the visualized calf veins. Visualized portions of bilateral profunda femoral vein and great saphenous vein unremarkable. No filling defects to suggest DVT on grayscale or color Doppler imaging. Doppler waveforms show normal direction of venous flow, normal respiratory plasticity and response to augmentation. OTHER None. Limitations: none IMPRESSION: No evidence of bilateral lower extremity deep venous thrombosis. Electronically Signed   By: Genevie Ann  M.D.   On: 08/27/2021 04:40   MR Lumbar Spine W Wo Contrast  Result Date: 08/27/2021 CLINICAL DATA:  Initial evaluation for progressively worsening lower back pain, right greater than left hip and buttock pain. EXAM: MRI LUMBAR SPINE WITHOUT AND WITH CONTRAST TECHNIQUE: Multiplanar and multiecho pulse sequences of the lumbar spine were obtained without and with intravenous contrast. CONTRAST:  1m GADAVIST GADOBUTROL 1 MMOL/ML IV SOLN COMPARISON:  Prior MRI from 12/28/2018. FINDINGS: Segmentation: Standard. Lowest well-formed disc space labeled the L5-S1 level. Alignment: Up to 4 mm retrolisthesis of L3 on L4, progressed from previous. Alignment otherwise normal with preservation of the normal lumbar lordosis. Vertebrae: Postoperative changes from prior PLIF at L4-5. Vertebral body height maintained without acute or chronic fracture. Bone marrow signal intensity within normal limits. No discrete or worrisome osseous lesions. Prominent reactive marrow edema and enhancement present about the L3-4 interspace, favored to be degenerative in nature. Superimposed infection somewhat difficult to exclude, but felt to be unlikely. No other abnormal marrow edema or enhancement. Conus medullaris and cauda equina: Conus extends to the L1-2 level. Conus medullaris within normal limits. Nerve roots of the cauda equina mildly irregular proximal to the L3-4 level due to stenosis. Paraspinal and other soft tissues: Postoperative changes noted within the posterior paraspinous soft tissues. Paraspinous soft tissues demonstrate no other acute finding. Disc levels: L1-2: Negative interspace. Minimal facet spurring. No canal or foraminal stenosis. L2-3: Negative interspace. Mild facet hypertrophy. No significant canal or foraminal stenosis. L3-4: Progressive degenerative intervertebral disc space narrowing and 4 mm retrolisthesis. Associated diffuse disc bulge with reactive endplate spurring. Superimposed right subarticular disc  protrusion with inferior migration (series 8, images 26, 27). Moderate facet and ligament flavum hypertrophy with associated small joint effusions. Resultant severe canal with severe right worse than left subarticular stenosis. Moderate right with severe left L3 foraminal narrowing. L4-5: Prior PLIF. No residual spinal stenosis. Residual mild to moderate left with mild right foraminal narrowing. L5-S1: Negative interspace. Moderate right with mild left facet hypertrophy. No spinal stenosis. Foramina remain patent. IMPRESSION: 1. Progressive degenerative spondylosis at L3-4 with resultant severe canal with right worse than left subarticular stenosis, with moderate right and severe left L3 foraminal  narrowing. Prominent reactive marrow edema and enhancement at this level favored to be degenerative in nature. Superimposed infection somewhat difficult to exclude, but felt to be unlikely. Correlation with any potential symptomatology and laboratory values recommended as warranted. 2. Prior PLIF at L4-5 without residual spinal stenosis. Electronically Signed   By: Jeannine Boga M.D.   On: 08/27/2021 00:56   DG Chest Port 1 View  Result Date: 08/26/2021 CLINICAL DATA:  Sepsis EXAM: PORTABLE CHEST 1 VIEW COMPARISON:  None Available. FINDINGS: The heart size and mediastinal contours are within normal limits. Both lungs are clear. The visualized skeletal structures are unremarkable. IMPRESSION: No active disease. Electronically Signed   By: Fidela Salisbury M.D.   On: 08/26/2021 23:46    EKG: Independently reviewed.   Little Ishikawa DO [facility] For contact please use secure messenger on Epic  If 7PM-7AM, please contact night-coverage located on [email or web address]   08/27/2021, 5:23 PM

## 2021-08-27 NOTE — TOC Initial Note (Signed)
Transition of Care (TOC) - Initial/Assessment Note    Patient Details  Name: Terry Horn. MRN: [id number] Date of Birth: 1961-01-09  Transition of Care [facility]) CM/SW Contact:    Beverly Sessions, RN Phone Number: 08/27/2021, 10:25 AM  Clinical Narrative:                   Transition of Care [facility]) Screening Note   Patient Details  Name: Terry Horn. Date of Birth: [birth year]   Transition of Care [facility]) CM/SW Contact:    Beverly Sessions, RN Phone Number: 08/27/2021, 10:25 AM    Transition of Care Department [facility]) has reviewed patient and no TOC needs have been identified at this time. We will continue to monitor patient advancement through interdisciplinary progression rounds. If new patient transition needs arise, please place a TOC consult.         Patient Goals and CMS Choice        Expected Discharge Plan and Services                                                Prior Living Arrangements/Services                       Activities of Daily Living Home Assistive Devices/Equipment: CPAP ADL Screening (condition at time of admission) Patient's cognitive ability adequate to safely complete daily activities?: Yes Is the patient deaf or have difficulty hearing?: No Does the patient have difficulty seeing, even when wearing glasses/contacts?: No Does the patient have difficulty concentrating, remembering, or making decisions?: No Patient able to express need for assistance with ADLs?: Yes Does the patient have difficulty dressing or bathing?: No Independently performs ADLs?: Yes (appropriate for developmental age) Does the patient have difficulty walking or climbing stairs?: No Weakness of Legs: None Weakness of Arms/Hands: None  Permission Sought/Granted                  Emotional Assessment              Admission diagnosis:  Low back pain [M54.50] Intractable low back pain [M54.59] Fever, unspecified fever  cause [R50.9] Acute bilateral low back pain without sciatica [M54.50] Sepsis, due to unspecified organism, unspecified whether acute organ dysfunction present [facility]) [A41.9] Patient Active Problem List   Diagnosis Date Noted   Severe sepsis ([place]) 08/27/2021   Edema leg 08/27/2021   Coronary artery disease involving native coronary artery of native heart 10/10/2020   Spondylolisthesis, lumbar region 02/14/2020   Chronic knee pain (Bilateral) 09/09/2017   Osteoarthritis of knee (Secondary) (Bilateral) 09/09/2017   Congenital Lumbar central spinal stenosis 09/09/2017   Cervical central spinal stenosis 09/09/2017   Carpal tunnel syndrome (Bilateral) 09/09/2017   Abnormal nerve conduction studies 09/09/2017   Osteoarthritis of spine with radiculopathy, lumbosacral region 09/09/2017   Osteoarthritis of facet joint of lumbar spine 09/09/2017   Osteoarthritis involving multiple joints on both sides of body 09/09/2017   Cervicalgia 09/09/2017   Chronic neck pain 09/09/2017   Numbness of upper extremity (Bilateral) 09/09/2017   Numbness of lower extremity (Left) 09/09/2017   Spondylosis without myelopathy or radiculopathy, lumbosacral region 09/09/2017   Lumbar facet syndrome (Midline pain pattern) (Bilateral) 09/09/2017   Lumbar facet arthropathy (L3-4, L4-L5, and L5-S1) (Bilateral) 09/09/2017   Low back pain 09/09/2017   Hyperglycemia  02/20/2016   Chronic low back pain (Primary Area of Pain) (Bilateral) w/ sciatica (Left) 02/20/2016   DDD (degenerative disc disease), lumbar 02/20/2016   Lumbar spondylosis 02/20/2016   DDD (degenerative disc disease), cervical 02/20/2016   Cervical spondylolysis 02/20/2016   History of colonic polyps 02/06/2016   Encounter for screening colonoscopy 01/21/2016   Mixed hyperlipidemia 10/01/2015   Chest pressure 07/26/2015   Paresthesia 07/26/2015   Arthritis 01/02/2015   Body mass index 45.0-49.9, adult ([place]) 01/02/2015   Essential (primary) hypertension  01/02/2015   Adiposity 01/02/2015   Apnea, sleep 01/02/2015   Avitaminosis D 01/02/2015   PCP:  Jerrol Banana., MD Pharmacy:   [facility] [id number] - [address], [place] [address] Phone: [phone] Fax: [phone]     Social Determinants of Health (SDOH) Interventions    Readmission Risk Interventions     No data to display

## 2021-08-27 NOTE — Assessment & Plan Note (Signed)
Cont pt on asa and statin.  Stable no chest pain.

## 2021-08-27 NOTE — Assessment & Plan Note (Signed)
Pt has new onset bl leg edema which is new. We will get usg of legs.

## 2021-08-27 NOTE — Consult Note (Signed)
Pharmacy Antibiotic Note  Terry Horn. is a 61 y.o. male admitted on 08/27/2021 with history of L4-L5 decompression and MRI of L spine is not able to rule out possible infection. Pharmacy has been consulted for empiric Vancomycin dosing. Pt has been transferred to [facility] for possible surgical intervention.   Plan: Continue Vancomycin '1750mg'$  IV q12h. Goal AUC 400-550  Est AUC: 499, SCr 0.8 Vd coeff 0.5 Monitor clinical picture, renal function, and vancomycin levels at steady state F/U C&S, abx deescalation / LOT      Temp (24hrs), Avg:98.9 F (37.2 C), Min:98.3 F (36.8 C), Max:100.5 F (38.1 C)  Recent Labs  Lab 08/26/21 2212 08/26/21 2332 08/27/21 0513  WBC 10.2  --  9.0  CREATININE 0.75  --  0.59*  LATICACIDVEN 2.8* 1.6  --      Estimated Creatinine Clearance: 148.2 mL/min (A) (by C-G formula based on SCr of 0.59 mg/dL (L)).    No Known Allergies  Antimicrobials this admission: 6/28 Flagyl x 1 6/28 cefepime x 1  6/28 vancomycin >>   Microbiology results: 6/27 BCx [facility]):  6/28 MRSA PCR [facility]): negative  Thank you for allowing pharmacy to be a part of this patient's care.  Sherlon Handing, PharmD, BCPS Please see amion for complete clinical pharmacist phone list 08/27/2021 5:36 PM

## 2021-08-27 NOTE — Progress Notes (Signed)
Admission profile updated. ?

## 2021-08-27 NOTE — H&P (Addendum)
History and Physical    Patient: Terry Horn. HOZ:[id number] DOB: 03-11-1960 DOA: 08/26/2021 DOS: the patient was seen and examined on 08/27/2021 PCP: Jerrol Banana., MD  Patient coming from: Home  Chief Complaint:  Chief Complaint  Patient presents with   Back Pain   HPI: Terry Horn. is a 61 y.o. male with medical history significant of arthritis, GERD, hypertension, sleep apnea presenting with low back pain. Pain has been going on intermittently for the past several weeks he has had spinal fusion at L4-L5 level and sees Dr. Arnoldo Morale patient was having difficulty getting approval for MRI from his insurance and finally got that done which is negative for infection or cauda equina but patient does have ambulatory dysfunction due to pain.  Today patient had a low-grade fever of 100.5 and was found to have an elevated lactic of 2.8 in the emergency room patient was treated with sepsis rehydration IV fluid and that has improved the lactic acid to 1.6.  MRI of the spine shows degenerative disease similar to patient's history and prominent reactive marrow edema and enhancement at L3-L4 level which is is degenerative in nature but a superimposed infection was difficult to be excluded.  The emergency room patient met sepsis criteria and was started on broad-spectrum IV antibiotics for covering MRSA, gram-positive, anaerobes gram-negative.  Chart review shows patient is a prediabetic.  Blood work today shows essentially normal CMP except for BUN of 27, normal CBC, procalcitonin less than 10, lactic acid of 2.8, repeat lactic of 1.6.  Urinalysis today shows moderate hemoglobin and 21-50 RBCs consistent with hematuria. Blood cultures collected in the emergency room. Back pain: Lower back pain. Since the 6/13 . Sharp knife like pain.  Sometimes down both legs > Right  Last 3-4 days he has had pelvic pressure as well. Last 3 night has had low grade fevers. Pt also describes dysuria  and incomplete emptying.  Review of Systems: As mentioned in the history of present illness. All other systems reviewed and are negative. Past Medical History:  Diagnosis Date   Arthritis    joints,hands,elbow   Basal cell carcinoma    L scapula   Cancer ([place])    skin cancer removed   GERD (gastroesophageal reflux disease)    Hypertension    Pneumonia    Sleep apnea    with cpap   Past Surgical History:  Procedure Laterality Date   EXCISION OF SKIN TAG N/A 02/06/2016   Procedure: EXCISION OF SKIN TAG;  Surgeon: [name], MD;  Location: [facility] ORS;  Service: General;  Laterality: N/A;   TONSILLECTOMY     Social History:  reports that he quit smoking about 24 years ago. His smoking use included cigarettes. He has never used smokeless tobacco. He reports current alcohol use. He reports that he does not use drugs.  No Known Allergies  Family History  Problem Relation Age of Onset   Bipolar disorder Mother    Thyroid disease Mother    Depression Sister    Prostate cancer Paternal Grandfather    Prostate cancer Paternal Uncle     Prior to Admission medications   Medication Sig Start Date End Date Taking? Authorizing Provider  aspirin 81 MG tablet Take 81 mg by mouth daily.  04/08/10   [provider]  Azelastine-Fluticasone (DYMISTA) 137-50 MCG/ACT SUSP Place 2 sprays into the nose daily. Patient not taking: Reported on 08/11/2021 04/15/17   Jerrol Banana., MD  celecoxib (  CELEBREX) 200 MG capsule Take 1 capsule (200 mg total) by mouth daily as needed. 08/11/21   Jerrol Banana., MD  cetirizine-pseudoephedrine (ZYRTEC-D) 5-120 MG tablet Take 1 tablet by mouth daily.  10/07/10   [provider]  Cholecalciferol 1000 UNITS tablet Take 1,000 Units by mouth daily.  04/08/10   [provider]  cyclobenzaprine (FLEXERIL) 10 MG tablet Take 1 tablet (10 mg total) by mouth at bedtime. 08/11/21   Jerrol Banana., MD  Dextran 70-Hypromellose  (ARTIFICIAL TEARS) 0.1-0.3 % SOLN Place 1 drop into both eyes daily as needed (Dry eye).    [provider]  diclofenac (VOLTAREN) 50 MG EC tablet Take 1 tablet (50 mg total) by mouth 2 (two) times daily. 04/11/21   Jerrol Banana., MD  finasteride (PROSCAR) 5 MG tablet TAKE ONE TABLET BY MOUTH DAILY 12/03/20   Jerrol Banana., MD  fish oil-omega-3 fatty acids 1000 MG capsule Take 2,000 tablets by mouth daily.  04/08/10   [provider]  gabapentin (NEURONTIN) 300 MG capsule TAKE ONE CAPSULE BY MOUTH EVERY MORNING AND TAKE TWO CAPSULES BY MOUTH EVERY NIGHT AT BEDTIME 05/28/21   Jerrol Banana., MD  HYDROcodone-acetaminophen [facility]) 5-325 MG tablet Take 1 tablet by mouth every 4 (four) hours as needed for moderate pain or severe pain. 08/13/21   Jerrol Banana., MD  hydrocortisone 2.5 % cream Apply topically 3 (three) times a week. 3 times weekly to scaly areas brows, Tuesday, Thursday, Saturday 05/30/21   Ralene Bathe, MD  losartan-hydrochlorothiazide [facility]) 100-12.5 MG tablet TAKE ONE TABLET BY MOUTH DAILY 08/25/21   Jerrol Banana., MD  Misc Natural Products (GLUCOSAMINE CHONDROITIN COMPLX) TABS Take 2 tablets by mouth daily.     [provider]  Multiple Vitamin tablet Take 1 tablet by mouth daily.  04/08/10   [provider]  pravastatin (PRAVACHOL) 20 MG tablet Take 20 mg by mouth at bedtime.  01/19/17 01/06/21  [provider]  pravastatin (PRAVACHOL) 80 MG tablet Take 80 mg by mouth daily. 06/28/21   [provider]  predniSONE (STERAPRED UNI-PAK 21 TAB) 10 MG (21) TBPK tablet Taper 6-5-4-3-2-1 08/13/21   Jerrol Banana., MD  Probiotic Product (PROBIOTIC DAILY) CAPS Take 1 tablet by mouth daily.    [provider]  tamsulosin (FLOMAX) 0.4 MG CAPS capsule TAKE ONE CAPSULE BY MOUTH DAILY 03/04/21   Rumball, Jake Church, DO  Turmeric, Lear Ng, POWD Take 1 tablet by mouth daily.     [provider]  vitamin C (ASCORBIC ACID) 500 MG tablet Take 500 mg by mouth daily.  04/08/10   [provider]  vitamin E 400 UNIT capsule Take 400 Units by mouth daily.  04/08/10   [provider]    Physical Exam: Vitals:   08/27/21 0100 08/27/21 0130 08/27/21 0200 08/27/21 0230  BP: (!) 119/55 (!) 124/54 (!) 122/53 (!) 108/54  Pulse: 80 77 79 82  Resp: '16 13 19 19  ' Temp:      TempSrc:      SpO2: 97% 96% 94% 95%  Weight:      Height:       Physical Exam Vitals and nursing note reviewed.  Constitutional:      General: He is not in acute distress.    Appearance: Normal appearance. He is not ill-appearing, toxic-appearing or diaphoretic.  HENT:     Head: Normocephalic and atraumatic.  Right Ear: Hearing and external ear normal.     Left Ear: Hearing and external ear normal.     Nose: Nose normal. No nasal deformity.     Mouth/Throat:     Lips: Pink.     Mouth: Mucous membranes are moist.     Tongue: No lesions.     Pharynx: Oropharynx is clear.  Eyes:     Extraocular Movements: Extraocular movements intact.     Pupils: Pupils are equal, round, and reactive to light.  Cardiovascular:     Rate and Rhythm: Normal rate and regular rhythm.     Pulses: Normal pulses.     Heart sounds: Normal heart sounds.  Pulmonary:     Effort: Pulmonary effort is normal.     Breath sounds: Normal breath sounds.  Abdominal:     General: Bowel sounds are normal. There is no distension.     Palpations: Abdomen is soft. There is no mass.     Tenderness: There is abdominal tenderness. There is no guarding.     Hernia: No hernia is present.  Musculoskeletal:        General: Tenderness present.     Right lower leg: Edema present.     Left lower leg: Edema present.  Skin:    General: Skin is warm.  Neurological:     General: No focal deficit present.     Mental Status: He is alert and oriented to person, place, and time.     Cranial Nerves: Cranial nerves 2-12 are intact.      Motor: Motor function is intact.  Psychiatric:        Attention and Perception: Attention normal.        Mood and Affect: Mood normal.        Speech: Speech normal.        Behavior: Behavior normal. Behavior is cooperative.        Cognition and Memory: Cognition normal.     Data Reviewed: Results for orders placed or performed during the hospital encounter of 08/26/21 (from the past 24 hour(s))  Urinalysis, Routine w reflex microscopic Urine, Clean Catch     Status: Abnormal   Collection Time: 08/26/21  9:37 PM  Result Value Ref Range   Color, Urine YELLOW (A) YELLOW   APPearance HAZY (A) CLEAR   Specific Gravity, Urine 1.026 1.005 - 1.030   pH 6.0 5.0 - 8.0   Glucose, UA NEGATIVE NEGATIVE mg/dL   Hgb urine dipstick MODERATE (A) NEGATIVE   Bilirubin Urine NEGATIVE NEGATIVE   Ketones, ur 5 (A) NEGATIVE mg/dL   Protein, ur NEGATIVE NEGATIVE mg/dL   Nitrite NEGATIVE NEGATIVE   Leukocytes,Ua NEGATIVE NEGATIVE   RBC / HPF 21-50 0 - 5 RBC/hpf   WBC, UA 0-5 0 - 5 WBC/hpf   Bacteria, UA NONE SEEN NONE SEEN   Squamous Epithelial / LPF 0-5 0 - 5   Mucus PRESENT   CBC with Differential     Status: Abnormal   Collection Time: 08/26/21 10:12 PM  Result Value Ref Range   WBC 10.2 4.0 - 10.5 K/uL   RBC 4.80 4.22 - 5.81 MIL/uL   Hemoglobin 13.3 13.0 - 17.0 g/dL   HCT 42.6 39.0 - 52.0 %   MCV 88.8 80.0 - 100.0 fL   MCH 27.7 26.0 - 34.0 pg   MCHC 31.2 30.0 - 36.0 g/dL   RDW 13.0 11.5 - 15.5 %   Platelets 301 150 - 400 K/uL   nRBC 0.0  0.0 - 0.2 %   Neutrophils Relative % 69 %   Neutro Abs 7.2 1.7 - 7.7 K/uL   Lymphocytes Relative 18 %   Lymphs Abs 1.8 0.7 - 4.0 K/uL   Monocytes Relative 11 %   Monocytes Absolute 1.1 (H) 0.1 - 1.0 K/uL   Eosinophils Relative 1 %   Eosinophils Absolute 0.1 0.0 - 0.5 K/uL   Basophils Relative 1 %   Basophils Absolute 0.1 0.0 - 0.1 K/uL   Immature Granulocytes 0 %   Abs Immature Granulocytes 0.03 0.00 - 0.07 K/uL  Comprehensive metabolic panel      Status: Abnormal   Collection Time: 08/26/21 10:12 PM  Result Value Ref Range   Sodium 140 135 - 145 mmol/L   Potassium 4.2 3.5 - 5.1 mmol/L   Chloride 100 98 - 111 mmol/L   CO2 29 22 - 32 mmol/L   Glucose, Bld 100 (H) 70 - 99 mg/dL   BUN 27 (H) 6 - 20 mg/dL   Creatinine, Ser 0.75 0.61 - 1.24 mg/dL   Calcium 9.2 8.9 - 10.3 mg/dL   Total Protein 7.3 6.5 - 8.1 g/dL   Albumin 3.7 3.5 - 5.0 g/dL   AST 18 15 - 41 U/L   ALT 16 0 - 44 U/L   Alkaline Phosphatase 66 38 - 126 U/L   Total Bilirubin 0.5 0.3 - 1.2 mg/dL   GFR, Estimated >60 >60 mL/min   Anion gap 11 5 - 15  Lactic acid, plasma     Status: Abnormal   Collection Time: 08/26/21 10:12 PM  Result Value Ref Range   Lactic Acid, Venous 2.8 (HH) 0.5 - 1.9 mmol/L  Procalcitonin - Baseline     Status: None   Collection Time: 08/26/21 10:12 PM  Result Value Ref Range   Procalcitonin <0.10 ng/mL  Lipase, blood     Status: None   Collection Time: 08/26/21 10:12 PM  Result Value Ref Range   Lipase 35 11 - 51 U/L  Protime-INR     Status: None   Collection Time: 08/26/21 11:31 PM  Result Value Ref Range   Prothrombin Time 14.6 11.4 - 15.2 seconds   INR 1.2 0.8 - 1.2  APTT     Status: Abnormal   Collection Time: 08/26/21 11:31 PM  Result Value Ref Range   aPTT 56 (H) 24 - 36 seconds  Lactic acid, plasma     Status: None   Collection Time: 08/26/21 11:32 PM  Result Value Ref Range   Lactic Acid, Venous 1.6 0.5 - 1.9 mmol/L     Assessment and Plan: * Low back pain We will cont with pain control and cont IV abx. Not sure if there is source of infection at L3 level consider ID consult.   Severe sepsis (HCC) Blood pressure (!) 124/54, pulse 77, temperature (!) 100.5 F (38.1 C), temperature source Oral, resp. rate 13, height '5\' 11"'  (1.803 m), weight (!) 153.8 kg, SpO2 96 %. and elevated lactic acid Pt meets severe sepsis criteria.  We will cont with IV abx.  Follow C/S. source still not certain. Urine culture to be  collected.    Coronary artery disease involving native coronary artery of native heart Cont pt on asa and statin.  Stable no chest pain.   Essential (primary) hypertension Blood pressure (!) 124/54, pulse 77, temperature (!) 100.5 F (38.1 C), temperature source Oral, resp. rate 13, height '5\' 11"'  (1.803 m), weight (!) 153.8 kg, SpO2 96 %. cont pt  on losartan hctz. Kidney function is 0.75.   Apnea, sleep CPAP not tolerated per chart.  Will defer to PCP to discuss oral appliance options.    Body mass index 45.0-49.9, adult ([place]) Will defer to PCP for weight loss options.   Edema leg Pt has new onset bl leg edema which is new. We will get usg of legs.      Advance Care Planning:    Code Status: Full Code   Consults:  None   Family Communication:  Gradie, Ohm (Spouse)  [phone] (Mobile)  Severity of Illness: The appropriate patient status for this patient is OBSERVATION. Observation status is judged to be reasonable and necessary in order to provide the required intensity of service to ensure the patient's safety. The patient's presenting symptoms, physical exam findings, and initial radiographic and laboratory data in the context of their medical condition is felt to place them at decreased risk for further clinical deterioration. Furthermore, it is anticipated that the patient will be medically stable for discharge from the hospital within 2 midnights of admission.   Author: Para Skeans, MD 08/27/2021 3:02 AM  For on call review [email or web address].

## 2021-08-27 NOTE — Progress Notes (Signed)
*  PRELIMINARY RESULTS* Echocardiogram 2D Echocardiogram has been performed.  Sherrie Sport 08/27/2021, 1:56 PM

## 2021-08-27 NOTE — Consult Note (Signed)
Reason for Consult: Lumbar spinal stenosis Referring Physician: Dr. Maree Krabbe T Rue Tinnel. is an 61 y.o. male.  HPI: Mr. Bula is a 61 year old white male well-known to me secondary to an L4-5 fusion in December 2021.  He has done well until about 2 weeks ago when he developed severe back and leg pain.  We made multiple attempts to get an outpatient lumbar MRI but could not get insurance approval despite jumping through all their hoops.  The patient finally got his MRI yesterday.  After the MRI had worsening pain.  He went to [facility] via EMS and was admitted.  There was concerned about possible sepsis because he had a low-grade temperature.  The work-up thus far has been negative.  Presently the patient is alert and pleasant.  He complains of back and leg pain.  He says he cannot stand or walk.  He has had no trouble with his lumbar wound.  He has not improved despite time and medical management.  Past Medical History:  Diagnosis Date   Arthritis    joints,hands,elbow   Basal cell carcinoma    L scapula   Cancer ([place])    skin cancer removed   GERD (gastroesophageal reflux disease)    Hypertension    Pneumonia    Sleep apnea    with cpap    Past Surgical History:  Procedure Laterality Date   EXCISION OF SKIN TAG N/A 02/06/2016   Procedure: EXCISION OF SKIN TAG;  Surgeon: [name], MD;  Location: [facility] ORS;  Service: General;  Laterality: N/A;   TONSILLECTOMY      Family History  Problem Relation Age of Onset   Bipolar disorder Mother    Thyroid disease Mother    Depression Sister    Prostate cancer Paternal Grandfather    Prostate cancer Paternal Uncle     Social History:  reports that he quit smoking about 24 years ago. His smoking use included cigarettes. He has never used smokeless tobacco. He reports current alcohol use. He reports that he does not use drugs.  Allergies: No Known Allergies  Medications: I have reviewed the patient's current  medications. Prior to Admission:  Medications Prior to Admission  Medication Sig Dispense Refill Last Dose   acetaminophen (TYLENOL) 325 MG tablet Take 2 tablets (650 mg total) by mouth every 6 (six) hours as needed for mild pain, moderate pain, fever or headache (or Fever >/= 101).   unk   aspirin 81 MG tablet Take 81 mg by mouth daily.    08/26/2021   Cholecalciferol 1000 UNITS tablet Take 1,000 Units by mouth daily.    08/26/2021   cyclobenzaprine (FLEXERIL) 10 MG tablet Take 1 tablet (10 mg total) by mouth at bedtime. 30 tablet 0 08/26/2021   Dextran 70-Hypromellose (ARTIFICIAL TEARS) 0.1-0.3 % SOLN Place 1 drop into both eyes daily as needed (Dry eye).   unk   diclofenac (VOLTAREN) 50 MG EC tablet Take 1 tablet (50 mg total) by mouth 2 (two) times daily. 60 tablet 11 08/26/2021   finasteride (PROSCAR) 5 MG tablet TAKE ONE TABLET BY MOUTH DAILY (Patient taking differently: Take 5 mg by mouth daily.) 90 tablet 2 08/26/2021   fish oil-omega-3 fatty acids 1000 MG capsule Take 2,000 tablets by mouth daily.    08/26/2021   gabapentin (NEURONTIN) 300 MG capsule TAKE ONE CAPSULE BY MOUTH EVERY MORNING AND TAKE TWO CAPSULES BY MOUTH EVERY NIGHT AT BEDTIME (Patient taking differently: Take 300-600 mg by mouth See  admin instructions. Take one capsule (300 mg) every morning and take two capsules (600 mg) every night at bedtime.) 270 capsule 3 08/26/2021   HYDROcodone-acetaminophen (NORCO) 5-325 MG tablet Take 1 tablet by mouth every 4 (four) hours as needed for moderate pain or severe pain. 30 tablet 0 08/26/2021   hydrocortisone 2.5 % cream Apply topically 3 (three) times a week. 3 times weekly to scaly areas brows, Tuesday, Thursday, Saturday (Patient taking differently: Apply 1 Application topically 3 (three) times a week. 3 times weekly to scaly areas brows, Tuesday, Thursday, Saturday) 30 g 6 unk   losartan-hydrochlorothiazide (HYZAAR) 100-12.5 MG tablet TAKE ONE TABLET BY MOUTH DAILY (Patient taking  differently: Take 1 tablet by mouth daily.) 90 tablet 3 08/26/2021   Misc Natural Products (GLUCOSAMINE CHONDROITIN COMPLX) TABS Take 2 tablets by mouth daily.    08/26/2021   Multiple Vitamin tablet Take 1 tablet by mouth daily.    08/26/2021   polyethylene glycol (MIRALAX / GLYCOLAX) 17 g packet Take 17 g by mouth daily as needed for moderate constipation. 14 each 0 unk   pravastatin (PRAVACHOL) 80 MG tablet Take 80 mg by mouth daily.   08/26/2021   Probiotic Product (PROBIOTIC DAILY) CAPS Take 1 tablet by mouth daily.   08/26/2021   senna-docusate (SENOKOT-S) 8.6-50 MG tablet Take 1 tablet by mouth at bedtime.   Past Week   tamsulosin (FLOMAX) 0.4 MG CAPS capsule TAKE ONE CAPSULE BY MOUTH DAILY (Patient taking differently: Take 0.4 mg by mouth daily.) 90 capsule 1 08/26/2021   Turmeric, Curcuma [place]a, POWD Take 1 tablet by mouth daily.    08/26/2021   vitamin C (ASCORBIC ACID) 500 MG tablet Take 500 mg by mouth daily.    08/26/2021   vitamin E 400 UNIT capsule Take 400 Units by mouth daily.    08/26/2021   Azelastine-Fluticasone (DYMISTA) 137-50 MCG/ACT SUSP Place 2 sprays into the nose daily. (Patient not taking: Reported on 08/11/2021) 1 Bottle 3 Not Taking   Scheduled:  heparin  5,000 Units Subcutaneous Q8H   Continuous:  [START ON 08/28/2021] vancomycin     JOA:[id number] Anti-infectives (From admission, onward)    Start     Dose/Rate Route Frequency Ordered Stop   08/28/21 0600  vancomycin (VANCOREADY) IVPB 1750 mg/350 mL        1,750 mg 175 mL/hr over 120 Minutes Intravenous Every 12 hours 08/27/21 1740          Results for orders placed or performed during the hospital encounter of 08/26/21 (from the past 48 hour(s))  Urinalysis, Routine w reflex microscopic Urine, Clean Catch     Status: Abnormal   Collection Time: 08/26/21  9:37 PM  Result Value Ref Range   Color, Urine YELLOW (A) YELLOW   APPearance HAZY (A) CLEAR   Specific Gravity, Urine 1.026 1.005 - 1.030   pH 6.0 5.0 -  8.0   Glucose, UA NEGATIVE NEGATIVE mg/dL   Hgb urine dipstick MODERATE (A) NEGATIVE   Bilirubin Urine NEGATIVE NEGATIVE   Ketones, ur 5 (A) NEGATIVE mg/dL   Protein, ur NEGATIVE NEGATIVE mg/dL   Nitrite NEGATIVE NEGATIVE   Leukocytes,Ua NEGATIVE NEGATIVE   RBC / HPF 21-50 0 - 5 RBC/hpf   WBC, UA 0-5 0 - 5 WBC/hpf   Bacteria, UA NONE SEEN NONE SEEN   Squamous Epithelial / LPF 0-5 0 - 5   Mucus PRESENT     Comment: Performed at [facility], [address] [place], [place] [address]  CBC with Differential     Status: Abnormal   Collection Time: 08/26/21 10:12 PM  Result Value Ref Range   WBC 10.2 4.0 - 10.5 K/uL   RBC 4.80 4.22 - 5.81 MIL/uL   Hemoglobin 13.3 13.0 - 17.0 g/dL   HCT 42.6 39.0 - 52.0 %   MCV 88.8 80.0 - 100.0 fL   MCH 27.7 26.0 - 34.0 pg   MCHC 31.2 30.0 - 36.0 g/dL   RDW 13.0 11.5 - 15.5 %   Platelets 301 150 - 400 K/uL   nRBC 0.0 0.0 - 0.2 %   Neutrophils Relative % 69 %   Neutro Abs 7.2 1.7 - 7.7 K/uL   Lymphocytes Relative 18 %   Lymphs Abs 1.8 0.7 - 4.0 K/uL   Monocytes Relative 11 %   Monocytes Absolute 1.1 (H) 0.1 - 1.0 K/uL   Eosinophils Relative 1 %   Eosinophils Absolute 0.1 0.0 - 0.5 K/uL   Basophils Relative 1 %   Basophils Absolute 0.1 0.0 - 0.1 K/uL   Immature Granulocytes 0 %   Abs Immature Granulocytes 0.03 0.00 - 0.07 K/uL    Comment: Performed at [facility], [address] [place], [place] [address]  Comprehensive metabolic panel     Status: Abnormal   Collection Time: 08/26/21 10:12 PM  Result Value Ref Range   Sodium 140 135 - 145 mmol/L   Potassium 4.2 3.5 - 5.1 mmol/L   Chloride 100 98 - 111 mmol/L   CO2 29 22 - 32 mmol/L   Glucose, Bld 100 (H) 70 - 99 mg/dL    Comment: Glucose reference range applies only to samples taken after fasting for at least 8 hours.   BUN 27 (H) 6 - 20 mg/dL   Creatinine, Ser 0.75 0.61 - 1.24 mg/dL   Calcium 9.2 8.9 - 10.3 mg/dL   Total Protein 7.3 6.5 - 8.1 g/dL   Albumin 3.7  3.5 - 5.0 g/dL   AST 18 15 - 41 U/L   ALT 16 0 - 44 U/L   Alkaline Phosphatase 66 38 - 126 U/L   Total Bilirubin 0.5 0.3 - 1.2 mg/dL   GFR, Estimated >60 >60 mL/min    Comment: (NOTE) Calculated using the CKD-EPI Creatinine Equation (2021)    Anion gap 11 5 - 15    Comment: Performed at [facility], [address] [place], [place] [address]  Lactic acid, plasma     Status: Abnormal   Collection Time: 08/26/21 10:12 PM  Result Value Ref Range   Lactic Acid, Venous 2.8 (HH) 0.5 - 1.9 mmol/L    Comment: CRITICAL RESULT CALLED TO, READ BACK BY AND VERIFIED WITH ASHLLEY [id number] 08/26/21 RH Performed at [facility], [address] [place], [place] [address]   Procalcitonin - Baseline     Status: None   Collection Time: 08/26/21 10:12 PM  Result Value Ref Range   Procalcitonin <0.10 ng/mL    Comment:        Interpretation: PCT (Procalcitonin) <= 0.5 ng/mL: Systemic infection (sepsis) is not likely. Local bacterial infection is possible. (NOTE)       Sepsis PCT Algorithm           Lower Respiratory Tract                                      Infection PCT Algorithm    ----------------------------     ----------------------------  PCT < 0.25 ng/mL                PCT < 0.10 ng/mL          Strongly encourage             Strongly discourage   discontinuation of antibiotics    initiation of antibiotics    ----------------------------     -----------------------------       PCT 0.25 - 0.50 ng/mL            PCT 0.10 - 0.25 ng/mL               OR       >80% decrease in PCT            Discourage initiation of                                            antibiotics      Encourage discontinuation           of antibiotics    ----------------------------     -----------------------------         PCT >= 0.50 ng/mL              PCT 0.26 - 0.50 ng/mL               AND        <80% decrease in PCT             Encourage initiation of                                              antibiotics       Encourage continuation           of antibiotics    ----------------------------     -----------------------------        PCT >= 0.50 ng/mL                  PCT > 0.50 ng/mL               AND         increase in PCT                  Strongly encourage                                      initiation of antibiotics    Strongly encourage escalation           of antibiotics                                     -----------------------------                                           PCT <= 0.25 ng/mL  OR                                        > 80% decrease in PCT                                      Discontinue / Do not initiate                                             antibiotics  Performed at [facility], [address] [place], [place] [address]   Lipase, blood     Status: None   Collection Time: 08/26/21 10:12 PM  Result Value Ref Range   Lipase 35 11 - 51 U/L    Comment: Performed at [facility], [address] [place], [place] [address]  Protime-INR     Status: None   Collection Time: 08/26/21 11:31 PM  Result Value Ref Range   Prothrombin Time 14.6 11.4 - 15.2 seconds   INR 1.2 0.8 - 1.2    Comment: (NOTE) INR goal varies based on device and disease states. Performed at [facility], [address] [place], [place] [address]   APTT     Status: Abnormal   Collection Time: 08/26/21 11:31 PM  Result Value Ref Range   aPTT 56 (H) 24 - 36 seconds    Comment:        IF BASELINE aPTT IS ELEVATED, SUGGEST PATIENT RISK ASSESSMENT BE USED TO DETERMINE APPROPRIATE ANTICOAGULANT THERAPY. Performed at [facility], [address] [place], [place] [address]   Blood Culture (routine x 2)     Status: None (Preliminary result)   Collection Time: 08/26/21 11:31 PM   Specimen: BLOOD  Result Value Ref Range   Specimen Description BLOOD RIGHT ASSIST CONTROL     Special Requests      BOTTLES DRAWN AEROBIC AND ANAEROBIC Blood Culture adequate volume   Culture      NO GROWTH < 12 HOURS Performed at [facility], [address], [place], [place] [address]    Report Status PENDING   Blood Culture (routine x 2)     Status: None (Preliminary result)   Collection Time: 08/26/21 11:31 PM   Specimen: BLOOD  Result Value Ref Range   Specimen Description BLOOD LEFT ASSIST CONTROL    Special Requests      BOTTLES DRAWN AEROBIC AND ANAEROBIC Blood Culture results may not be optimal due to an inadequate volume of blood received in culture bottles   Culture      NO GROWTH < 12 HOURS Performed at [facility], [address] [place], [place] [address]    Report Status PENDING   Lactic acid, plasma     Status: None   Collection Time: 08/26/21 11:32 PM  Result Value Ref Range   Lactic Acid, Venous 1.6 0.5 - 1.9 mmol/L    Comment: Performed at [facility], [address], [place], [place] [address]  Procalcitonin     Status: None   Collection Time: 08/27/21  3:24 AM  Result Value Ref Range   Procalcitonin <0.10 ng/mL    Comment:  Interpretation: PCT (Procalcitonin) <= 0.5 ng/mL: Systemic infection (sepsis) is not likely. Local bacterial infection is possible. (NOTE)       Sepsis PCT Algorithm           Lower Respiratory Tract                                      Infection PCT Algorithm    ----------------------------     ----------------------------         PCT < 0.25 ng/mL                PCT < 0.10 ng/mL          Strongly encourage             Strongly discourage   discontinuation of antibiotics    initiation of antibiotics    ----------------------------     -----------------------------       PCT 0.25 - 0.50 ng/mL            PCT 0.10 - 0.25 ng/mL               OR       >80% decrease in PCT            Discourage initiation of                                            antibiotics      Encourage  discontinuation           of antibiotics    ----------------------------     -----------------------------         PCT >= 0.50 ng/mL              PCT 0.26 - 0.50 ng/mL               AND        <80% decrease in PCT             Encourage initiation of                                             antibiotics       Encourage continuation           of antibiotics    ----------------------------     -----------------------------        PCT >= 0.50 ng/mL                  PCT > 0.50 ng/mL               AND         increase in PCT                  Strongly encourage                                      initiation of antibiotics    Strongly encourage escalation           of antibiotics                                     -----------------------------  PCT <= 0.25 ng/mL                                                 OR                                        > 80% decrease in PCT                                      Discontinue / Do not initiate                                             antibiotics  Performed at [facility], [address] [place], [place] [address]   HIV Antibody (routine testing w rflx)     Status: None   Collection Time: 08/27/21  3:24 AM  Result Value Ref Range   HIV Screen 4th Generation wRfx Non Reactive Non Reactive    Comment: Performed at [facility], [place] [address], [place], [place] [address]  TSH     Status: None   Collection Time: 08/27/21  3:24 AM  Result Value Ref Range   TSH 0.809 0.350 - 4.500 uIU/mL    Comment: Performed by a 3rd Generation assay with a functional sensitivity of <=0.01 uIU/mL. Performed at [facility], [address] [place], [place] [address]   Comprehensive metabolic panel     Status: Abnormal   Collection Time: 08/27/21  5:13 AM  Result Value Ref Range   Sodium 139 135 - 145 mmol/L   Potassium 3.5 3.5 - 5.1 mmol/L   Chloride 103 98 - 111 mmol/L   CO2 27 22 -  32 mmol/L   Glucose, Bld 99 70 - 99 mg/dL    Comment: Glucose reference range applies only to samples taken after fasting for at least 8 hours.   BUN 22 (H) 6 - 20 mg/dL   Creatinine, Ser 0.59 (L) 0.61 - 1.24 mg/dL   Calcium 8.7 (L) 8.9 - 10.3 mg/dL   Total Protein 6.4 (L) 6.5 - 8.1 g/dL   Albumin 3.3 (L) 3.5 - 5.0 g/dL   AST 14 (L) 15 - 41 U/L   ALT 13 0 - 44 U/L   Alkaline Phosphatase 55 38 - 126 U/L   Total Bilirubin 0.5 0.3 - 1.2 mg/dL   GFR, Estimated >60 >60 mL/min    Comment: (NOTE) Calculated using the CKD-EPI Creatinine Equation (2021)    Anion gap 9 5 - 15    Comment: Performed at [facility], [address] [place], [place] [address]  CBC     Status: Abnormal   Collection Time: 08/27/21  5:13 AM  Result Value Ref Range   WBC 9.0 4.0 - 10.5 K/uL   RBC 4.20 (L) 4.22 - 5.81 MIL/uL   Hemoglobin 11.7 (L) 13.0 - 17.0 g/dL   HCT 36.9 (L) 39.0 - 52.0 %   MCV 87.9 80.0 - 100.0 fL   MCH 27.9 26.0 - 34.0 pg   MCHC 31.7 30.0 - 36.0  g/dL   RDW 13.0 11.5 - 15.5 %   Platelets 255 150 - 400 K/uL   nRBC 0.0 0.0 - 0.2 %    Comment: Performed at [facility], [address] [place], [place] [address]  Surgical PCR screen     Status: None   Collection Time: 08/27/21  2:12 PM   Specimen: Nasal Mucosa; Nasal Swab  Result Value Ref Range   MRSA, PCR NEGATIVE NEGATIVE   Staphylococcus aureus NEGATIVE NEGATIVE    Comment: (NOTE) The Xpert SA Assay (FDA approved for NASAL specimens in patients 75 years of age and older), is one component of a comprehensive surveillance program. It is not intended to diagnose infection nor to guide or monitor treatment. Performed at [facility], [address] [place], [place] [address]     US Venous Img Lower Bilateral (DVT)  Result Date: 08/27/2021 CLINICAL DATA:  61 year old male with bilateral lower extremity edema. EXAM: BILATERAL LOWER EXTREMITY VENOUS DOPPLER ULTRASOUND TECHNIQUE: Gray-scale sonography with  compression, as well as color and duplex ultrasound, were performed to evaluate the deep venous system(s) from the level of the common femoral vein through the popliteal and proximal calf veins. COMPARISON:  None Available. FINDINGS: VENOUS Normal compressibility of the bilateral common femoral, superficial femoral, and popliteal veins, as well as the visualized calf veins. Visualized portions of bilateral profunda femoral vein and great saphenous vein unremarkable. No filling defects to suggest DVT on grayscale or color Doppler imaging. Doppler waveforms show normal direction of venous flow, normal respiratory plasticity and response to augmentation. OTHER None. Limitations: none IMPRESSION: No evidence of bilateral lower extremity deep venous thrombosis. Electronically Signed   By: Genevie Ann M.D.   On: 08/27/2021 04:40   MR Lumbar Spine W Wo Contrast  Result Date: 08/27/2021 CLINICAL DATA:  Initial evaluation for progressively worsening lower back pain, right greater than left hip and buttock pain. EXAM: MRI LUMBAR SPINE WITHOUT AND WITH CONTRAST TECHNIQUE: Multiplanar and multiecho pulse sequences of the lumbar spine were obtained without and with intravenous contrast. CONTRAST:  83m GADAVIST GADOBUTROL 1 MMOL/ML IV SOLN COMPARISON:  Prior MRI from 12/28/2018. FINDINGS: Segmentation: Standard. Lowest well-formed disc space labeled the L5-S1 level. Alignment: Up to 4 mm retrolisthesis of L3 on L4, progressed from previous. Alignment otherwise normal with preservation of the normal lumbar lordosis. Vertebrae: Postoperative changes from prior PLIF at L4-5. Vertebral body height maintained without acute or chronic fracture. Bone marrow signal intensity within normal limits. No discrete or worrisome osseous lesions. Prominent reactive marrow edema and enhancement present about the L3-4 interspace, favored to be degenerative in nature. Superimposed infection somewhat difficult to exclude, but felt to be unlikely. No  other abnormal marrow edema or enhancement. Conus medullaris and cauda equina: Conus extends to the L1-2 level. Conus medullaris within normal limits. Nerve roots of the cauda equina mildly irregular proximal to the L3-4 level due to stenosis. Paraspinal and other soft tissues: Postoperative changes noted within the posterior paraspinous soft tissues. Paraspinous soft tissues demonstrate no other acute finding. Disc levels: L1-2: Negative interspace. Minimal facet spurring. No canal or foraminal stenosis. L2-3: Negative interspace. Mild facet hypertrophy. No significant canal or foraminal stenosis. L3-4: Progressive degenerative intervertebral disc space narrowing and 4 mm retrolisthesis. Associated diffuse disc bulge with reactive endplate spurring. Superimposed right subarticular disc protrusion with inferior migration (series 8, images 26, 27). Moderate facet and ligament flavum hypertrophy with associated small joint effusions. Resultant severe canal with severe right worse than left subarticular  stenosis. Moderate right with severe left L3 foraminal narrowing. L4-5: Prior PLIF. No residual spinal stenosis. Residual mild to moderate left with mild right foraminal narrowing. L5-S1: Negative interspace. Moderate right with mild left facet hypertrophy. No spinal stenosis. Foramina remain patent. IMPRESSION: 1. Progressive degenerative spondylosis at L3-4 with resultant severe canal with right worse than left subarticular stenosis, with moderate right and severe left L3 foraminal narrowing. Prominent reactive marrow edema and enhancement at this level favored to be degenerative in nature. Superimposed infection somewhat difficult to exclude, but felt to be unlikely. Correlation with any potential symptomatology and laboratory values recommended as warranted. 2. Prior PLIF at L4-5 without residual spinal stenosis. Electronically Signed   By: Jeannine Boga M.D.   On: 08/27/2021 00:56   DG Chest Port 1  View  Result Date: 08/26/2021 CLINICAL DATA:  Sepsis EXAM: PORTABLE CHEST 1 VIEW COMPARISON:  None Available. FINDINGS: The heart size and mediastinal contours are within normal limits. Both lungs are clear. The visualized skeletal structures are unremarkable. IMPRESSION: No active disease. Electronically Signed   By: Fidela Salisbury M.D.   On: 08/26/2021 23:46    ROS: As above Blood pressure (!) 146/64, pulse 81, temperature 99.2 F (37.3 C), temperature source Oral, resp. rate 18, SpO2 97 %. Estimated body mass index is 47.28 kg/m as calculated from the following:   Height as of 08/26/21: '5\' 11"'$  (1.803 m).   Weight as of 08/26/21: 153.8 kg.  Physical Exam  General: An alert and pleasant obese 61 year old white male in bed.  HEENT: Normocephalic, atraumatic, extraocular muscles are intact  Neck: Unremarkable  Thorax: Symmetric  Abdomen: Soft  Extremities: Unremarkable  Back exam: The patient's lumbar incision is well-healed.  Neurologic exam: The patient is alert and oriented x3.  Glasgow Coma Scale 15.  Cranial nerves II through XII are grossly normal.  The patient's motor strength is grossly normal his vital quadricep, gastrocnemius and extensor hallux is longus.  Sensory function is intact to light touch sensation all tested dermatomes bilaterally.  I reviewed the patient's lumbar MRI performed 08/27/2021.  The patient has a good decompression and fusion at L4-5.  He has severe multifactoral spinal stenosis at L3-4.  There is some degenerative bony reactive change.  I do not see any signs of infection although it cannot be completely excluded.  Assessment/Plan: Lumbar adjacent disease, lumbar degenerative disease, lumbar spinal stenosis, lumbar disc, lumbar radiculopathy, neurogenic claudication: I have discussed the situation with the patient.  We have discussed the various treatment options including surgery.  I have recommended an exploration of his lumbar fusion with an L3-4  decompression, instrumentation and fusion.  I have described the surgery to him.  We have discussed the risk of surgery including risks of anesthesia, hemorrhage, infection, spinal fluid leak, nerve injury, instrumentation malfunction/malplacement, medical risks, failure to relieve his symptoms, worsening symptoms, etc.  We have discussed the expected postoperative course and likelihood of achieving our goals with surgery.  I have answered all his questions.  He has decided proceed with surgery.  We will plan to get this done tomorrow.  I have also spoken with his wife via the telephone.  Ophelia Charter 08/27/2021, 9:36 PM

## 2021-08-27 NOTE — Discharge Summary (Signed)
Physician Discharge Summary  Terry Serve. TIR:[id number] DOB: 1960-09-01 DOA: 08/26/2021  PCP: Jerrol Banana., MD  Admit date: 08/26/2021 Discharge date: 08/27/2021  Admitted From: Home Discharge disposition: Transfer to [facility]  Brief narrative: Terry Pagnotta. is a 61 y.o. Horn with PMH significant for morbid obesity, OSA on CPAP, HTN, CAD, GERD, arthritis. Patient was brought to ED from home on 6/27 for progressively worsening low back pain.  December 2021, patient underwent, L4-L5 decompression fusion for lumbar radiculopathy history of stage, lumbar facet arthropathy. For the past several weeks, patient has intermittently severe progressively worsening low back pain.  He was seen by Dr. Arnoldo Morale and underwent MRI lumbar spine on 6/27.  It showed progressive degenerative spondylosis at L3-4 with resultant severe canal with right worse than left subarticular stenosis, with moderate right and severe left L3 foraminal narrowing. Prominent reactive marrow edema and enhancement at this level favored to be degenerative in nature. Superimposed infection was less likely but could not rule out. Because of the level of severity of his symptoms and resultant ambulatory dysfunction, he was sent to the ED for further evaluation management.  In the ED, he had a temperature of 100.5 Labs with unremarkable CBC, lactic acid elevated to 2.8, BMP unremarkable. Urinalysis showed yellow hazy urine with moderate amount of hemoglobin Patient started on broad-spectrum IV antibiotics Admitted to hospitalist service  Subjective: Patient was seen and examined this morning.  Terry Horn.  Lying on bed.  Not in distress.  Pain controlled at this time.  Wife at bedside. No fever since admission.  Hemodynamically stable Repeat labs this morning unremarkable.  Lactic acid level normalized.  Assessment and plan: Severe spinal canal stenosis Prior history of L4-L5  decompression -Patient with chronic low back pain with prior surgical intervention  -Now presenting with progressively worsening intermittent severe low back pain  -Follows up with Dr. Arnoldo Morale as an outpatient.  MRI findings as above.   -Since the MRI was not able to rule out any infection, patient is currently getting covered with broad-spectrum antibiotics. -I called and discussed with neurosurgeon Dr. Arnoldo Morale this morning.  Recommended transfer to [facility].  May need surgical intervention.  I will keep him n.p.o. overnight.  Suspected sepsis -On arrival, patient had temperature 100.5, lactic acid elevated 2.8 but with normal leukocytes. -Potential source of infection per MRI lumbar spine because of the presence of prominent reactive marrow edema. -Continue IV vancomycin for now -Continue to monitor Recent Labs  Lab 08/26/21 2212 08/26/21 2332 08/27/21 0324 08/27/21 0513  WBC 10.2  --   --  9.0  LATICACIDVEN 2.8* 1.6  --   --   PROCALCITON <0.10  --  <0.10  --    Essential hypertension -Currently on losartan and HCTZ. -With a tentative plan of surgery tomorrow, I would keep these medicines on hold -Hydralazine IV as needed.  Continue to monitor blood pressure  Bilateral lower extremity edema -No evidence of DVT. -Ordered for echocardiogram.  Last echo from 2017 with EF 55 to 65%.  History of CAD -Currently no anginal symptoms -Currently on aspirin and statin. -I will keep aspirin on hold in progression of neurosurgery tomorrow.    Morbid obesity  -Body mass index is 47.28 kg/m. Patient has been advised to make an attempt to improve diet and exercise patterns to aid in weight loss.  OSA -Nightly CPAP.  Encouraged to bring his own CPAP machine from home.  BPH -Flomax, finasteride  Constipation -No BM in last 3 days.  Probably due to poor mobility and use of pain medicines. -Start Senokot and as needed laxative.    Goals of care   Code Status: Full Code     Mobility: Needs PT eval postprocedure  Skin assessment:     Nutritional status:  Body mass index is 47.28 kg/m.          Diet:  Diet Order             Diet NPO time specified  Diet effective midnight           Diet heart healthy/carb modified Room service appropriate? Yes; Fluid consistency: Thin  Diet effective now           Diet general                   Wounds:  -    Discharge Exam:   Vitals:   08/27/21 0530 08/27/21 0600 08/27/21 0657 08/27/21 0725  BP: 113/76 115/62 117/66 107/62  Pulse: 76 77 81 78  Resp: '17 17 17 17  '$ Temp:  98.4 F (36.9 C) 98.3 F (36.8 C) 98.5 F (36.9 C)  TempSrc:  Oral Oral   SpO2: 100% 96% 99% 98%  Weight:      Height:        Body mass index is 47.28 kg/m.   General exam: Terry, middle-aged Caucasian Horn.  Not in physical distress at rest Skin: No rashes, lesions or ulcers. HEENT: Atraumatic, normocephalic, no obvious bleeding Lungs: Clear to auscultation bilaterally CVS: Regular rate and rhythm, no murmur GI/Abd soft, nontender, nondistended, bowel sound present CNS: Alert, awake, oriented x3 Psychiatry: Sad affect Extremities: Puffy bilateral lower extremities with trace pedal edema.   Follow ups:    Follow-up Information     Jerrol Banana., MD Follow up.   Specialty: Family Medicine Contact information: [address] [place] [address] [phone]                 Discharge Instructions:   Discharge Instructions     Call MD for:  difficulty breathing, headache or visual disturbances   Complete by: As directed    Call MD for:  extreme fatigue   Complete by: As directed    Call MD for:  hives   Complete by: As directed    Call MD for:  persistant dizziness or light-headedness   Complete by: As directed    Call MD for:  persistant nausea and vomiting   Complete by: As directed    Call MD for:  severe uncontrolled pain   Complete by: As directed    Call  MD for:  temperature >100.4   Complete by: As directed    Diet general   Complete by: As directed    Discharge instructions   Complete by: As directed    General discharge instructions: Follow with Primary MD Jerrol Banana., MD in 7 days  Please request your PCP  to go over your hospital tests, procedures, radiology results at the follow up. Please get your medicines reviewed and adjusted.  Your PCP may decide to repeat certain labs or tests as needed. Do not drive, operate heavy machinery, perform activities at heights, swimming or participation in water activities or provide baby sitting services if your were admitted for syncope or siezures until you have seen by Primary MD or a Neurologist and advised to do so again. [place] Controlled Substance  Reporting System database was reviewed. Do not drive, operate heavy machinery, perform activities at heights, swim, participate in water activities or provide baby-sitting services while on medications for pain, sleep and mood until your outpatient physician has reevaluated you and advised to do so again.  You are strongly recommended to comply with the dose, frequency and duration of prescribed medications. Activity: As tolerated with Full fall precautions use walker/cane & assistance as needed Avoid using any recreational substances like cigarette, tobacco, alcohol, or non-prescribed drug. If you experience worsening of your admission symptoms, develop shortness of breath, life threatening emergency, suicidal or homicidal thoughts you must seek medical attention immediately by calling 911 or calling your MD immediately  if symptoms less severe. You must read complete instructions/literature along with all the possible adverse reactions/side effects for all the medicines you take and that have been prescribed to you. Take any new medicine only after you have completely understood and accepted all the possible adverse reactions/side effects.   Wear Seat belts while driving. You were cared for by a hospitalist during your hospital stay. If you have any questions about your discharge medications or the care you received while you were in the hospital after you are discharged, you can call the unit and ask to speak with the hospitalist or the covering physician. Once you are discharged, your primary care physician will handle any further medical issues. Please note that NO REFILLS for any discharge medications will be authorized once you are discharged, as it is imperative that you return to your primary care physician (or establish a relationship with a primary care physician if you do not have one).   Increase activity slowly   Complete by: As directed        Discharge Medications:   Allergies as of 08/27/2021   No Known Allergies      Medication List     STOP taking these medications    celecoxib 200 MG capsule Commonly known as: CELEBREX   cetirizine-pseudoephedrine 5-120 MG tablet Commonly known as: ZYRTEC-D   predniSONE 10 MG (21) Tbpk tablet Commonly known as: STERAPRED UNI-PAK 21 TAB       TAKE these medications    acetaminophen 325 MG tablet Commonly known as: TYLENOL Take 2 tablets (650 mg total) by mouth every 6 (six) hours as needed for mild pain, moderate pain, fever or headache (or Fever >/= 101).   Artificial Tears 0.1-0.3 % Soln Generic drug: Dextran 70-Hypromellose Place 1 drop into both eyes daily as needed (Dry eye).   aspirin 81 MG tablet Take 81 mg by mouth daily.   Azelastine-Fluticasone 137-50 MCG/ACT Susp Commonly known as: Dymista Place 2 sprays into the nose daily.   Cholecalciferol 25 MCG (1000 UT) tablet Take 1,000 Units by mouth daily.   cyclobenzaprine 10 MG tablet Commonly known as: FLEXERIL Take 1 tablet (10 mg total) by mouth at bedtime.   diclofenac 50 MG EC tablet Commonly known as: VOLTAREN Take 1 tablet (50 mg total) by mouth 2 (two) times daily.   finasteride 5  MG tablet Commonly known as: PROSCAR TAKE ONE TABLET BY MOUTH DAILY   fish oil-omega-3 fatty acids 1000 MG capsule Take 2,000 tablets by mouth daily.   gabapentin 300 MG capsule Commonly known as: NEURONTIN TAKE ONE CAPSULE BY MOUTH EVERY MORNING AND TAKE TWO CAPSULES BY MOUTH EVERY NIGHT AT BEDTIME   Glucosamine Chondroitin Complx Tabs Take 2 tablets by mouth daily.   HYDROcodone-acetaminophen 5-325 MG tablet Commonly known as: Norco  Take 1 tablet by mouth every 4 (four) hours as needed for moderate pain or severe pain.   hydrocortisone 2.5 % cream Apply topically 3 (three) times a week. 3 times weekly to scaly areas brows, Tuesday, Thursday, Saturday   losartan-hydrochlorothiazide 100-12.5 MG tablet Commonly known as: HYZAAR TAKE ONE TABLET BY MOUTH DAILY   Multiple Vitamin tablet Take 1 tablet by mouth daily.   polyethylene glycol 17 g packet Commonly known as: MIRALAX / GLYCOLAX Take 17 g by mouth daily as needed for moderate constipation.   pravastatin 80 MG tablet Commonly known as: PRAVACHOL Take 80 mg by mouth daily.   Probiotic Daily Caps Take 1 tablet by mouth daily.   senna-docusate 8.6-50 MG tablet Commonly known as: Senokot-S Take 1 tablet by mouth at bedtime.   tamsulosin 0.4 MG Caps capsule Commonly known as: FLOMAX TAKE ONE CAPSULE BY MOUTH DAILY   Turmeric (Curcuma Longa) Powd Take 1 tablet by mouth daily.   vitamin C 500 MG tablet Commonly known as: ASCORBIC ACID Take 500 mg by mouth daily.   vitamin E 180 MG (400 UNITS) capsule Take 400 Units by mouth daily.         The results of significant diagnostics from this hospitalization (including imaging, microbiology, ancillary and laboratory) are listed below for reference.    Procedures and Diagnostic Studies:   US Venous Img Lower Bilateral (DVT)  Result Date: 08/27/2021 CLINICAL DATA:  61 year old Horn with bilateral lower extremity edema. EXAM: BILATERAL LOWER EXTREMITY VENOUS  DOPPLER ULTRASOUND TECHNIQUE: Gray-scale sonography with compression, as well as color and duplex ultrasound, were performed to evaluate the deep venous system(s) from the level of the common femoral vein through the popliteal and proximal calf veins. COMPARISON:  None Available. FINDINGS: VENOUS Normal compressibility of the bilateral common femoral, superficial femoral, and popliteal veins, as well as the visualized calf veins. Visualized portions of bilateral profunda femoral vein and great saphenous vein unremarkable. No filling defects to suggest DVT on grayscale or color Doppler imaging. Doppler waveforms show normal direction of venous flow, normal respiratory plasticity and response to augmentation. OTHER None. Limitations: none IMPRESSION: No evidence of bilateral lower extremity deep venous thrombosis. Electronically Signed   By: Genevie Ann M.D.   On: 08/27/2021 04:40     Labs:   Basic Metabolic Panel: Recent Labs  Lab 08/26/21 2212 08/27/21 0513  NA 140 139  K 4.2 3.5  CL 100 103  CO2 29 27  GLUCOSE 100* 99  BUN 27* 22*  CREATININE 0.75 0.59*  CALCIUM 9.2 8.7*   GFR Estimated Creatinine Clearance: 148.2 mL/min (A) (by C-G formula based on SCr of 0.59 mg/dL (L)). Liver Function Tests: Recent Labs  Lab 08/26/21 2212 08/27/21 0513  AST 18 14*  ALT 16 13  ALKPHOS 66 55  BILITOT 0.5 0.5  PROT 7.3 6.4*  ALBUMIN 3.7 3.3*   Recent Labs  Lab 08/26/21 2212  LIPASE 35   No results for input(s): "AMMONIA" in the last 168 hours. Coagulation profile Recent Labs  Lab 08/26/21 2331  INR 1.2    CBC: Recent Labs  Lab 08/26/21 2212 08/27/21 0513  WBC 10.2 9.0  NEUTROABS 7.2  --   HGB 13.3 11.7*  HCT 42.6 36.9*  MCV 88.8 87.9  PLT 301 255   Cardiac Enzymes: No results for input(s): "CKTOTAL", "CKMB", "CKMBINDEX", "TROPONINI" in the last 168 hours. BNP: Invalid input(s): "POCBNP" CBG: No results for input(s): "GLUCAP" in the last 168 hours. D-Dimer No results for  input(s): "DDIMER" in the last 72 hours. Hgb A1c No results for input(s): "HGBA1C" in the last 72 hours. Lipid Profile No results for input(s): "CHOL", "HDL", "LDLCALC", "TRIG", "CHOLHDL", "LDLDIRECT" in the last 72 hours. Thyroid function studies Recent Labs    08/27/21 0324  TSH 0.809   Anemia work up No results for input(s): "VITAMINB12", "FOLATE", "FERRITIN", "TIBC", "IRON", "RETICCTPCT" in the last 72 hours. Microbiology Recent Results (from the past 240 hour(s))  Blood Culture (routine x 2)     Status: None (Preliminary result)   Collection Time: 08/26/21 11:31 PM   Specimen: BLOOD  Result Value Ref Range Status   Specimen Description BLOOD RIGHT ASSIST CONTROL  Final   Special Requests   Final    BOTTLES DRAWN AEROBIC AND ANAEROBIC Blood Culture adequate volume   Culture   Final    NO GROWTH < 12 HOURS Performed at [facility], [address], [place], [place] [address]    Report Status PENDING  Incomplete  Blood Culture (routine x 2)     Status: None (Preliminary result)   Collection Time: 08/26/21 11:31 PM   Specimen: BLOOD  Result Value Ref Range Status   Specimen Description BLOOD LEFT ASSIST CONTROL  Final   Special Requests   Final    BOTTLES DRAWN AEROBIC AND ANAEROBIC Blood Culture results may not be optimal due to an inadequate volume of blood received in culture bottles   Culture   Final    NO GROWTH < 12 HOURS Performed at [facility], [address] [place], [place] [address]    Report Status PENDING  Incomplete    Time coordinating discharge: 35 minutes  Signed: Alaster Asfaw  [facility] 08/27/2021, 1:01 PM

## 2021-08-27 NOTE — Progress Notes (Signed)
Report given to Antwanique at [facility] all questions answered to satisfaction. Patient is transferring via [facility].

## 2021-08-27 NOTE — Assessment & Plan Note (Signed)
CPAP not tolerated per chart.  Will defer to PCP to discuss oral appliance options.

## 2021-08-27 NOTE — Assessment & Plan Note (Signed)
We will cont with pain control and cont IV abx. Not sure if there is source of infection at L3 level consider ID consult.

## 2021-08-28 ENCOUNTER — Inpatient Hospital Stay (HOSPITAL_COMMUNITY): Payer: BC Managed Care – PPO

## 2021-08-28 ENCOUNTER — Encounter (HOSPITAL_COMMUNITY): Payer: Self-pay | Admitting: Internal Medicine

## 2021-08-28 ENCOUNTER — Inpatient Hospital Stay (HOSPITAL_COMMUNITY): Payer: BC Managed Care – PPO | Admitting: Anesthesiology

## 2021-08-28 ENCOUNTER — Encounter (HOSPITAL_COMMUNITY)
Admission: AD | Disposition: A | Discharge status: Home Health | Payer: Self-pay | Source: Other Acute Inpatient Hospital | Attending: Internal Medicine

## 2021-08-28 ENCOUNTER — Inpatient Hospital Stay (HOSPITAL_COMMUNITY): Admission: RE | Admit: 2021-08-28 | Payer: BC Managed Care – PPO | Source: Home / Self Care | Admitting: Neurosurgery

## 2021-08-28 ENCOUNTER — Other Ambulatory Visit: Payer: Self-pay

## 2021-08-28 DIAGNOSIS — R6520 Severe sepsis without septic shock: Secondary | ICD-10-CM | POA: Diagnosis not present

## 2021-08-28 DIAGNOSIS — A419 Sepsis, unspecified organism: Secondary | ICD-10-CM

## 2021-08-28 LAB — CBC
HCT: 36.1 % — ABNORMAL LOW (ref 39.0–52.0)
Hemoglobin: 11.8 g/dL — ABNORMAL LOW (ref 13.0–17.0)
MCH: 27.8 pg (ref 26.0–34.0)
MCHC: 32.7 g/dL (ref 30.0–36.0)
MCV: 84.9 fL (ref 80.0–100.0)
Platelets: 178 10*3/uL (ref 150–400)
RBC: 4.25 MIL/uL (ref 4.22–5.81)
RDW: 12.8 % (ref 11.5–15.5)
WBC: 8.6 10*3/uL (ref 4.0–10.5)
nRBC: 0 % (ref 0.0–0.2)

## 2021-08-28 LAB — BLOOD CULTURE ID PANEL (REFLEXED) - BCID2

## 2021-08-28 LAB — ECHOCARDIOGRAM COMPLETE
AR max vel: 2.61 cm2
AV Area VTI: 2.48 cm2
AV Area mean vel: 2.23 cm2
AV Mean grad: 2 mmHg
AV Peak grad: 2.7 mmHg
Ao pk vel: 0.82 m/s
Area-P 1/2: 4.12 cm2
Height: 71 in
Weight: 5424 oz

## 2021-08-28 LAB — BASIC METABOLIC PANEL
Anion gap: 12 (ref 5–15)
BUN: 12 mg/dL (ref 6–20)
CO2: 24 mmol/L (ref 22–32)
Calcium: 8.7 mg/dL — ABNORMAL LOW (ref 8.9–10.3)
Chloride: 103 mmol/L (ref 98–111)
Creatinine, Ser: 0.62 mg/dL (ref 0.61–1.24)
GFR, Estimated: >60 mL/min (ref >60)
Glucose, Bld: 108 mg/dL — ABNORMAL HIGH (ref 70–99)
Potassium: 3.4 mmol/L — ABNORMAL LOW (ref 3.5–5.1)
Sodium: 139 mmol/L (ref 135–145)

## 2021-08-28 LAB — TYPE AND SCREEN
ABO/RH(D): A POS
Antibody Screen: NEGATIVE

## 2021-08-28 LAB — HIGH SENSITIVITY CRP: CRP, High Sensitivity: 56.72 mg/L — ABNORMAL HIGH (ref 0.00–3.00)

## 2021-08-28 SURGERY — POSTERIOR LUMBAR FUSION 1 WITH HARDWARE REMOVAL
Anesthesia: General | Site: Back

## 2021-08-28 MED ORDER — MIDAZOLAM HCL 2 MG/2ML IJ SOLN
INTRAMUSCULAR | Status: AC
  Filled 2021-08-28: qty 2

## 2021-08-28 MED ORDER — MORPHINE SULFATE (PF) 2 MG/ML IV SOLN
4.0000 mg | INTRAVENOUS | Status: DC | PRN
  Filled 2021-08-28: qty 2

## 2021-08-28 MED ORDER — BUPIVACAINE-EPINEPHRINE (PF) 0.5% -1:200000 IJ SOLN
INTRAMUSCULAR | Status: DC | PRN
  Administered 2021-08-28: 10 mL

## 2021-08-28 MED ORDER — PROPOFOL 10 MG/ML IV BOLUS
INTRAVENOUS | Status: AC
  Filled 2021-08-28: qty 20

## 2021-08-28 MED ORDER — ACETAMINOPHEN 650 MG RE SUPP: 650.0000 mg | RECTAL | Status: DC | PRN

## 2021-08-28 MED ORDER — BISACODYL 10 MG RE SUPP: 10.0000 mg | Freq: Every day | RECTAL | Status: DC | PRN

## 2021-08-28 MED ORDER — CEFAZOLIN SODIUM-DEXTROSE 2-4 GM/100ML-% IV SOLN
2.0000 g | Freq: Three times a day (TID) | INTRAVENOUS | Status: AC
  Administered 2021-08-28 – 2021-08-29 (×2): 2 g via INTRAVENOUS
  Filled 2021-08-28 (×2): qty 100

## 2021-08-28 MED ORDER — ONDANSETRON HCL 4 MG/2ML IJ SOLN
INTRAMUSCULAR | Status: DC | PRN
  Administered 2021-08-28: 4 mg via INTRAVENOUS

## 2021-08-28 MED ORDER — PROPOFOL 10 MG/ML IV BOLUS
INTRAVENOUS | Status: DC | PRN
  Administered 2021-08-28: 200 mg via INTRAVENOUS

## 2021-08-28 MED ORDER — ROCURONIUM BROMIDE 10 MG/ML (PF) SYRINGE
PREFILLED_SYRINGE | INTRAVENOUS | Status: DC | PRN
  Administered 2021-08-28: 20 mg via INTRAVENOUS
  Administered 2021-08-28: 70 mg via INTRAVENOUS
  Administered 2021-08-28: 10 mg via INTRAVENOUS

## 2021-08-28 MED ORDER — DEXTROSE 5 % IV SOLN
INTRAVENOUS | Status: DC | PRN
  Administered 2021-08-28: 3 g via INTRAVENOUS

## 2021-08-28 MED ORDER — HYDROCODONE-ACETAMINOPHEN 5-325 MG PO TABS
1.0000 | ORAL_TABLET | ORAL | Status: DC | PRN
  Administered 2021-08-28: 2 via ORAL
  Filled 2021-08-28: qty 2

## 2021-08-28 MED ORDER — KETAMINE HCL 10 MG/ML IJ SOLN
INTRAMUSCULAR | Status: DC | PRN
  Administered 2021-08-28: 30 mg via INTRAVENOUS
  Administered 2021-08-28 (×2): 10 mg via INTRAVENOUS

## 2021-08-28 MED ORDER — PRAVASTATIN SODIUM 40 MG PO TABS
80.0000 mg | ORAL_TABLET | Freq: Every day | ORAL | Status: DC
  Administered 2021-08-28 – 2021-08-30 (×3): 80 mg via ORAL
  Filled 2021-08-28 (×3): qty 2

## 2021-08-28 MED ORDER — LACTATED RINGERS IV SOLN: INTRAVENOUS | Status: DC | PRN

## 2021-08-28 MED ORDER — ONDANSETRON HCL 4 MG/2ML IJ SOLN
4.0000 mg | Freq: Four times a day (QID) | INTRAMUSCULAR | Status: DC | PRN
  Administered 2021-08-28: 4 mg via INTRAVENOUS
  Filled 2021-08-28: qty 2

## 2021-08-28 MED ORDER — SUGAMMADEX SODIUM 200 MG/2ML IV SOLN
INTRAVENOUS | Status: DC | PRN
  Administered 2021-08-28: 300 mg via INTRAVENOUS

## 2021-08-28 MED ORDER — SODIUM CHLORIDE 0.9% FLUSH
3.0000 mL | Freq: Two times a day (BID) | INTRAVENOUS | Status: DC
  Administered 2021-08-28 – 2021-08-30 (×2): 3 mL via INTRAVENOUS

## 2021-08-28 MED ORDER — PHENOL 1.4 % MT LIQD: 1.0000 | OROMUCOSAL | Status: DC | PRN

## 2021-08-28 MED ORDER — HYDROCODONE-ACETAMINOPHEN 5-325 MG PO TABS
1.0000 | ORAL_TABLET | ORAL | Status: DC | PRN
  Administered 2021-08-28: 1 via ORAL
  Filled 2021-08-28: qty 1

## 2021-08-28 MED ORDER — BUPIVACAINE LIPOSOME 1.3 % IJ SUSP
INTRAMUSCULAR | Status: DC | PRN
  Administered 2021-08-28: 20 mL

## 2021-08-28 MED ORDER — DEXAMETHASONE SODIUM PHOSPHATE 10 MG/ML IJ SOLN
INTRAMUSCULAR | Status: DC | PRN
  Administered 2021-08-28: 10 mg via INTRAVENOUS

## 2021-08-28 MED ORDER — PHENYLEPHRINE HCL-NACL 20-0.9 MG/250ML-% IV SOLN
INTRAVENOUS | Status: DC | PRN
  Administered 2021-08-28: 50 ug/min via INTRAVENOUS

## 2021-08-28 MED ORDER — ACETAMINOPHEN 325 MG PO TABS
650.0000 mg | ORAL_TABLET | Freq: Four times a day (QID) | ORAL | Status: DC | PRN
Start: 2021-08-28 — End: 2021-08-28

## 2021-08-28 MED ORDER — THROMBIN 5000 UNITS EX SOLR
CUTANEOUS | Status: AC
  Filled 2021-08-28: qty 5000

## 2021-08-28 MED ORDER — FENTANYL CITRATE (PF) 250 MCG/5ML IJ SOLN
INTRAMUSCULAR | Status: AC
  Filled 2021-08-28: qty 5

## 2021-08-28 MED ORDER — SODIUM CHLORIDE 0.9 % IV SOLN
250.0000 mL | INTRAVENOUS | Status: DC
  Administered 2021-08-28: 250 mL via INTRAVENOUS

## 2021-08-28 MED ORDER — CYCLOBENZAPRINE HCL 10 MG PO TABS
10.0000 mg | ORAL_TABLET | Freq: Three times a day (TID) | ORAL | Status: DC | PRN
  Administered 2021-08-29 – 2021-08-30 (×5): 10 mg via ORAL
  Filled 2021-08-28 (×6): qty 1

## 2021-08-28 MED ORDER — ONDANSETRON HCL 4 MG/2ML IJ SOLN
4.0000 mg | Freq: Once | INTRAMUSCULAR | Status: AC
  Administered 2021-08-28: 4 mg via INTRAVENOUS
  Filled 2021-08-28: qty 2

## 2021-08-28 MED ORDER — TAMSULOSIN HCL 0.4 MG PO CAPS
0.4000 mg | ORAL_CAPSULE | Freq: Every day | ORAL | Status: DC
  Administered 2021-08-28 – 2021-08-30 (×3): 0.4 mg via ORAL
  Filled 2021-08-28 (×3): qty 1

## 2021-08-28 MED ORDER — SIMETHICONE 80 MG PO CHEW
80.0000 mg | CHEWABLE_TABLET | Freq: Four times a day (QID) | ORAL | Status: DC | PRN
  Administered 2021-08-29 – 2021-08-30 (×3): 80 mg via ORAL
  Filled 2021-08-28 (×5): qty 1

## 2021-08-28 MED ORDER — MORPHINE SULFATE (PF) 2 MG/ML IV SOLN
2.0000 mg | INTRAVENOUS | Status: DC | PRN
  Administered 2021-08-28 (×2): 2 mg via INTRAVENOUS
  Filled 2021-08-28 (×2): qty 1

## 2021-08-28 MED ORDER — 0.9 % SODIUM CHLORIDE (POUR BTL) OPTIME
TOPICAL | Status: DC | PRN
  Administered 2021-08-28: 1000 mL

## 2021-08-28 MED ORDER — BUPIVACAINE-EPINEPHRINE 0.5% -1:200000 IJ SOLN
INTRAMUSCULAR | Status: AC
  Filled 2021-08-28: qty 1

## 2021-08-28 MED ORDER — MENTHOL 3 MG MT LOZG: 1.0000 | LOZENGE | OROMUCOSAL | Status: DC | PRN

## 2021-08-28 MED ORDER — THROMBIN 5000 UNITS EX SOLR: OROMUCOSAL | Status: DC | PRN

## 2021-08-28 MED ORDER — ONDANSETRON HCL 4 MG PO TABS: 4.0000 mg | ORAL_TABLET | Freq: Four times a day (QID) | ORAL | Status: DC | PRN

## 2021-08-28 MED ORDER — DOCUSATE SODIUM 100 MG PO CAPS
100.0000 mg | ORAL_CAPSULE | Freq: Two times a day (BID) | ORAL | Status: DC
  Administered 2021-08-28 – 2021-08-30 (×4): 100 mg via ORAL
  Filled 2021-08-28 (×4): qty 1

## 2021-08-28 MED ORDER — ACETAMINOPHEN 500 MG PO TABS
1000.0000 mg | ORAL_TABLET | Freq: Once | ORAL | Status: AC
Start: 2021-08-28 — End: 2021-08-28
  Administered 2021-08-28: 1000 mg via ORAL
  Filled 2021-08-28: qty 2

## 2021-08-28 MED ORDER — KETAMINE HCL 50 MG/5ML IJ SOSY
PREFILLED_SYRINGE | INTRAMUSCULAR | Status: AC
  Filled 2021-08-28: qty 5

## 2021-08-28 MED ORDER — MIDAZOLAM HCL 2 MG/2ML IJ SOLN
INTRAMUSCULAR | Status: DC | PRN
  Administered 2021-08-28: 2 mg via INTRAVENOUS

## 2021-08-28 MED ORDER — CHLORHEXIDINE GLUCONATE 0.12 % MT SOLN
OROMUCOSAL | Status: AC
  Filled 2021-08-28: qty 15

## 2021-08-28 MED ORDER — HYDROCHLOROTHIAZIDE 12.5 MG PO TABS
12.5000 mg | ORAL_TABLET | Freq: Every day | ORAL | Status: DC
  Administered 2021-08-28 – 2021-08-30 (×2): 12.5 mg via ORAL
  Filled 2021-08-28 (×3): qty 1

## 2021-08-28 MED ORDER — HYDROMORPHONE HCL 1 MG/ML IJ SOLN
0.5000 mg | INTRAMUSCULAR | Status: AC
  Administered 2021-08-28: 0.5 mg via INTRAVENOUS
  Filled 2021-08-28: qty 1

## 2021-08-28 MED ORDER — ACETAMINOPHEN 325 MG PO TABS: 650.0000 mg | ORAL_TABLET | ORAL | Status: DC | PRN

## 2021-08-28 MED ORDER — BACITRACIN ZINC 500 UNIT/GM EX OINT
TOPICAL_OINTMENT | CUTANEOUS | Status: AC
  Filled 2021-08-28: qty 28.35

## 2021-08-28 MED ORDER — OXYCODONE HCL 5 MG PO TABS
10.0000 mg | ORAL_TABLET | ORAL | Status: DC | PRN
  Filled 2021-08-28: qty 2

## 2021-08-28 MED ORDER — BUPIVACAINE LIPOSOME 1.3 % IJ SUSP
INTRAMUSCULAR | Status: AC
  Filled 2021-08-28: qty 20

## 2021-08-28 MED ORDER — EPHEDRINE SULFATE-NACL 50-0.9 MG/10ML-% IV SOSY
PREFILLED_SYRINGE | INTRAVENOUS | Status: DC | PRN
  Administered 2021-08-28: 5 mg via INTRAVENOUS

## 2021-08-28 MED ORDER — FENTANYL CITRATE (PF) 250 MCG/5ML IJ SOLN
INTRAMUSCULAR | Status: DC | PRN
  Administered 2021-08-28: 100 ug via INTRAVENOUS
  Administered 2021-08-28 (×2): 50 ug via INTRAVENOUS

## 2021-08-28 MED ORDER — LOSARTAN POTASSIUM-HCTZ 100-12.5 MG PO TABS: 1.0000 | ORAL_TABLET | Freq: Every day | ORAL | Status: DC

## 2021-08-28 MED ORDER — GABAPENTIN 600 MG PO TABS
600.0000 mg | ORAL_TABLET | Freq: Every day | ORAL | Status: DC
  Administered 2021-08-28 – 2021-08-29 (×2): 600 mg via ORAL
  Filled 2021-08-28 (×2): qty 1

## 2021-08-28 MED ORDER — PHENYLEPHRINE 80 MCG/ML (10ML) SYRINGE FOR IV PUSH (FOR BLOOD PRESSURE SUPPORT)
PREFILLED_SYRINGE | INTRAVENOUS | Status: DC | PRN
  Administered 2021-08-28: 80 ug via INTRAVENOUS

## 2021-08-28 MED ORDER — FINASTERIDE 5 MG PO TABS
5.0000 mg | ORAL_TABLET | Freq: Every day | ORAL | Status: DC
  Administered 2021-08-28 – 2021-08-30 (×3): 5 mg via ORAL
  Filled 2021-08-28 (×3): qty 1

## 2021-08-28 MED ORDER — ACETAMINOPHEN 500 MG PO TABS
1000.0000 mg | ORAL_TABLET | Freq: Four times a day (QID) | ORAL | Status: AC
  Administered 2021-08-28 – 2021-08-29 (×4): 1000 mg via ORAL
  Filled 2021-08-28 (×4): qty 2

## 2021-08-28 MED ORDER — SODIUM CHLORIDE 0.9% FLUSH: 3.0000 mL | INTRAVENOUS | Status: DC | PRN

## 2021-08-28 MED ORDER — GABAPENTIN 300 MG PO CAPS: 300.0000 mg | ORAL_CAPSULE | ORAL | Status: DC

## 2021-08-28 MED ORDER — FENTANYL CITRATE (PF) 100 MCG/2ML IJ SOLN: 25.0000 ug | INTRAMUSCULAR | Status: DC | PRN

## 2021-08-28 MED ORDER — GABAPENTIN 300 MG PO CAPS
300.0000 mg | ORAL_CAPSULE | Freq: Every morning | ORAL | Status: DC
  Administered 2021-08-29 – 2021-08-30 (×2): 300 mg via ORAL
  Filled 2021-08-28 (×2): qty 1

## 2021-08-28 MED ORDER — CELECOXIB 200 MG PO CAPS
400.0000 mg | ORAL_CAPSULE | Freq: Once | ORAL | Status: AC
Start: 2021-08-28 — End: 2021-08-28
  Administered 2021-08-28: 400 mg via ORAL
  Filled 2021-08-28: qty 2

## 2021-08-28 MED ORDER — LIP MEDEX EX OINT
TOPICAL_OINTMENT | CUTANEOUS | Status: DC | PRN
  Filled 2021-08-28: qty 7

## 2021-08-28 MED ORDER — BACITRACIN ZINC 500 UNIT/GM EX OINT
TOPICAL_OINTMENT | CUTANEOUS | Status: DC | PRN
  Administered 2021-08-28: 1 via TOPICAL

## 2021-08-28 MED ORDER — LIDOCAINE 2% (20 MG/ML) 5 ML SYRINGE
INTRAMUSCULAR | Status: DC | PRN
  Administered 2021-08-28: 100 mg via INTRAVENOUS

## 2021-08-28 MED ORDER — OXYCODONE HCL 5 MG PO TABS
5.0000 mg | ORAL_TABLET | ORAL | Status: DC | PRN
  Administered 2021-08-29 – 2021-08-30 (×7): 5 mg via ORAL
  Filled 2021-08-28 (×6): qty 1

## 2021-08-28 MED ORDER — LOSARTAN POTASSIUM 50 MG PO TABS
100.0000 mg | ORAL_TABLET | Freq: Every day | ORAL | Status: DC
  Administered 2021-08-28 – 2021-08-30 (×2): 100 mg via ORAL
  Filled 2021-08-28 (×3): qty 2

## 2021-08-28 SURGICAL SUPPLY — 60 items
BAG COUNTER SPONGE SURGICOUNT (BAG) ×3 IMPLANT
BASKET BONE COLLECTION (BASKET) ×2 IMPLANT
BENZOIN TINCTURE PRP APPL 2/3 (GAUZE/BANDAGES/DRESSINGS) ×2 IMPLANT
BUR MATCHSTICK NEURO 3.0 LAGG (BURR) ×2 IMPLANT
BUR PRECISION FLUTE 6.0 (BURR) ×2 IMPLANT
CAGE ALTERA 10X31X9-13 15D (Cage) ×1 IMPLANT
CANISTER SUCT 3000ML PPV (MISCELLANEOUS) ×2 IMPLANT
CAP LOCK DLX THRD (Cap) ×6 IMPLANT
CARTRIDGE OIL MAESTRO DRILL (MISCELLANEOUS) ×1 IMPLANT
CNTNR URN SCR LID CUP LEK RST (MISCELLANEOUS) ×1 IMPLANT
CONT SPEC 4OZ STRL OR WHT (MISCELLANEOUS) ×1
COVER BACK TABLE 60X90IN (DRAPES) ×2 IMPLANT
DIFFUSER DRILL AIR PNEUMATIC (MISCELLANEOUS) ×2 IMPLANT
DRAPE C-ARM 42X72 X-RAY (DRAPES) ×4 IMPLANT
DRAPE HALF SHEET 40X57 (DRAPES) ×2 IMPLANT
DRAPE LAPAROTOMY 100X72X124 (DRAPES) ×2 IMPLANT
DRSG OPSITE POSTOP 4X6 (GAUZE/BANDAGES/DRESSINGS) ×2 IMPLANT
DRSG OPSITE POSTOP 4X8 (GAUZE/BANDAGES/DRESSINGS) ×1 IMPLANT
ELECT BLADE 4.0 EZ CLEAN MEGAD (MISCELLANEOUS) ×2
ELECT REM PT RETURN 9FT ADLT (ELECTROSURGICAL) ×2
ELECTRODE BLDE 4.0 EZ CLN MEGD (MISCELLANEOUS) ×1 IMPLANT
ELECTRODE REM PT RTRN 9FT ADLT (ELECTROSURGICAL) ×1 IMPLANT
GLOVE BIO SURGEON STRL SZ7 (GLOVE) ×1 IMPLANT
GLOVE BIO SURGEON STRL SZ8 (GLOVE) ×4 IMPLANT
GLOVE BIO SURGEON STRL SZ8.5 (GLOVE) ×4 IMPLANT
GLOVE BIOGEL PI IND STRL 7.5 (GLOVE) IMPLANT
GLOVE BIOGEL PI INDICATOR 7.5 (GLOVE) ×4
GLOVE ECLIPSE 6.5 STRL STRAW (GLOVE) ×1 IMPLANT
GLOVE ECLIPSE 7.5 STRL STRAW (GLOVE) ×3 IMPLANT
GOWN STRL REUS W/ TWL LRG LVL3 (GOWN DISPOSABLE) IMPLANT
GOWN STRL REUS W/ TWL XL LVL3 (GOWN DISPOSABLE) ×2 IMPLANT
GOWN STRL REUS W/TWL 2XL LVL3 (GOWN DISPOSABLE) IMPLANT
GOWN STRL REUS W/TWL LRG LVL3 (GOWN DISPOSABLE) ×2
GOWN STRL REUS W/TWL XL LVL3 (GOWN DISPOSABLE) ×3
HEMOSTAT POWDER KIT SURGIFOAM (HEMOSTASIS) ×2 IMPLANT
KIT BASIN OR (CUSTOM PROCEDURE TRAY) ×2 IMPLANT
KIT GRAFTMAG DEL NEURO DISP (NEUROSURGERY SUPPLIES) IMPLANT
KIT TURNOVER KIT B (KITS) ×2 IMPLANT
NDL HYPO 21X1.5 SAFETY (NEEDLE) IMPLANT
NEEDLE HYPO 21X1.5 SAFETY (NEEDLE) ×2 IMPLANT
NEEDLE HYPO 22GX1.5 SAFETY (NEEDLE) ×2 IMPLANT
NS IRRIG 1000ML POUR BTL (IV SOLUTION) ×2 IMPLANT
OIL CARTRIDGE MAESTRO DRILL (MISCELLANEOUS) ×2
PACK LAMINECTOMY NEURO (CUSTOM PROCEDURE TRAY) ×2 IMPLANT
PAD ARMBOARD 7.5X6 YLW CONV (MISCELLANEOUS) ×6 IMPLANT
PATTIES SURGICAL 1X1 (DISPOSABLE) ×1 IMPLANT
PUTTY DBM INSTAFILL CART 5CC (Putty) ×2 IMPLANT
ROD CURVED TI 6.35X70 (Rod) ×2 IMPLANT
SCREW PA DLX CREO 7.5X55 (Screw) ×2 IMPLANT
STRIP CLOSURE SKIN 1/2X4 (GAUZE/BANDAGES/DRESSINGS) ×2 IMPLANT
SUT VIC AB 1 CT1 18XBRD ANBCTR (SUTURE) ×2 IMPLANT
SUT VIC AB 1 CT1 8-18 (SUTURE) ×2
SUT VIC AB 2-0 CP2 18 (SUTURE) ×5 IMPLANT
SYR 20CC LL (SYRINGE) ×1 IMPLANT
SYR 20ML LL LF (SYRINGE) IMPLANT
TIP CONICAL INSTAFILL (ORTHOPEDIC DISPOSABLE SUPPLIES) ×1 IMPLANT
TOWEL GREEN STERILE (TOWEL DISPOSABLE) ×2 IMPLANT
TOWEL GREEN STERILE FF (TOWEL DISPOSABLE) ×2 IMPLANT
TRAY FOLEY MTR SLVR 16FR STAT (SET/KITS/TRAYS/PACK) ×2 IMPLANT
WATER STERILE IRR 1000ML POUR (IV SOLUTION) ×2 IMPLANT

## 2021-08-28 NOTE — Op Note (Signed)
Brief history: The patient is a 61 year old white male on whom I performed an L4-5 decompression, instrumentation and fusion in December 2021.  The patient has done well until recently.  About 3 weeks ago he developed severe back and right greater left leg pain and difficulty ambulating.  His insurance company refused to pay for a lumbar MRI until 2 days ago.  This demonstrated severe stenosis at L3-4.  I discussed the various treatment options and recommended surgery.  The patient decided proceed with a lumbar decompression, instrumentation and fusion.  Preoperative diagnosis: L3-4 adjacent segment disease with spondylolisthesis, degenerative disc disease, spinal stenosis compressing both the L3 and the L4 nerve roots; lumbago; lumbar radiculopathy; neurogenic claudication  Postoperative diagnosis: The same  Procedure: Bilateral L3-4 laminotomy/foraminotomies/medial facetectomy to decompress the bilateral L3 and L4 nerve roots(the work required to do this was in addition to the work required to do the posterior lumbar interbody fusion because of the patient's spinal stenosis, facet arthropathy. Etc. requiring a wide decompression of the nerve roots.);  L3-4 transforaminal lumbar interbody fusion with local morselized autograft bone and Zimmer DBM; insertion of interbody prosthesis at L3-4 (globus peek expandable interbody prosthesis); posterior L3-4 instrumentation from L3-L5 to L3-4 with globus titanium pedicle screws and rods; posterior lateral arthrodesis at L3-4 with local morselized autograft bone and globus DBM; exploration of lumbar fusion/removal of lumbar hardware.  Surgeon: Dr. Earle Gell  Asst.: Dr. Ashok Pall  Anesthesia: Gen. endotracheal  Estimated blood loss: 300 cc  Drains: None  Complications: None  Description of procedure: The patient was brought to the operating room by the anesthesia team. General endotracheal anesthesia was induced. The patient was turned to the prone  position on the Wilson frame. The patient's lumbosacral region was then prepared with Betadine scrub and Betadine solution. Sterile drapes were applied.  I then injected the area to be incised with Marcaine with epinephrine solution. I then used the scalpel to make a linear midline incision over the L3-4 and L4-5 interspace. I then used electrocautery to perform a bilateral subperiosteal dissection exposing the spinous process and lamina of L3, L4 and L5 and exposing the old hardware at L4-5.  We then inserted the Verstrac retractor to provide exposure.  We explored the fusion by removing the caps from the old screws and then removing the rods.  The arthrodesis at L4-5 appeared solid.  I began the decompression by using the high speed drill to perform laminotomies at L3-4 bilaterally. We then used the Kerrison punches to widen the laminotomy and removed the ligamentum flavum at L3-4 bilaterally. We used the Kerrison punches to remove the medial facets at L3-4. We performed wide foraminotomies about the bilateral L3 and L4 nerve roots completing the decompression.  There was severe multifactoral spinal stenosis.  We now turned our attention to the posterior lumbar interbody fusion. I used a scalpel to incise the intervertebral disc at L3-4.  The patient has significant disc herniations, right greater than left.  I then performed a partial intervertebral discectomy at L3-4 using the pituitary forceps.  I did not see any signs of infection but because of the concern of the radiologist I obtained cultures at the disc base.  We prepared the vertebral endplates at T1-5 for the fusion by removing the soft tissues with the curettes. We then used the trial spacers to pick the appropriate sized interbody prosthesis. We prefilled his prosthesis with a combination of local morselized autograft bone that we obtained during the decompression as well as  Zimmer DBM. We inserted the prefilled prosthesis into the interspace  at L3-4, we then turned and expanded the prosthesis. There was a good snug fit of the prosthesis in the interspace. We then filled and the remainder of the intervertebral disc space with local morselized autograft bone and Zimmer DBM. This completed the posterior lumbar interbody arthrodesis.  During the decompression and insertion of the prosthesis the assistant protected the thecal sac and nerve roots with the D'Errico retractor.  We now turned attention to the instrumentation. Under fluoroscopic guidance we cannulated the bilateral L3 pedicles with the bone probe. We then removed the bone probe. We then tapped the pedicle with a 6.5 millimeter tap. We then removed the tap. We probed inside the tapped pedicle with a ball probe to rule out cortical breaches. We then inserted a 7.5 x 55 millimeter pedicle screw into the L3 pedicles bilaterally under fluoroscopic guidance. We then palpated along the medial aspect of the pedicles to rule out cortical breaches. There were none. The nerve roots were not injured. We then connected the unilateral pedicle screws with a lordotic rod. We compressed the construct and secured the rod in place with the caps. We then tightened the caps appropriately. This completed the instrumentation from L3-L5 bilaterally.  We now turned our attention to the posterior lateral arthrodesis at L3-4. We used the high-speed drill to decorticate the remainder of the facets, pars, transverse process at L3-4. We then applied a combination of local morselized autograft bone and Zimmer DBM over these decorticated posterior lateral structures. This completed the posterior lateral arthrodesis.  We then obtained hemostasis using bipolar electrocautery. We irrigated the wound out with bacitracin solution. We inspected the thecal sac and nerve roots and noted they were well decompressed. We then removed the retractor.  We injected Exparel . We reapproximated patient's thoracolumbar fascia with  interrupted #1 Vicryl suture. We reapproximated patient's subcutaneous tissue with interrupted 2-0 Vicryl suture. The reapproximated patient's skin with Steri-Strips and benzoin. The wound was then coated with bacitracin ointment. A sterile dressing was applied. The drapes were removed. The patient was subsequently returned to the supine position where they were extubated by the anesthesia team. He was then transported to the post anesthesia care unit in stable condition. All sponge instrument and needle counts were reportedly correct at the end of this case.

## 2021-08-28 NOTE — Progress Notes (Signed)
OTF to OR

## 2021-08-28 NOTE — Anesthesia Procedure Notes (Signed)
Procedure Name: Intubation Date/Time: 08/28/2021 4:05 PM  Performed by: Georgia Duff, CRNAPre-anesthesia Checklist: Patient identified, Emergency Drugs available, Suction available and Patient being monitored Patient Re-evaluated:Patient Re-evaluated prior to induction Oxygen Delivery Method: Circle System Utilized Preoxygenation: Pre-oxygenation with 100% oxygen Induction Type: IV induction Ventilation: Mask ventilation without difficulty Laryngoscope Size: Glidescope and 4 Grade View: Grade I Tube type: Oral Tube size: 7.5 mm Number of attempts: 1 Airway Equipment and Method: Stylet and Oral airway Placement Confirmation: ETT inserted through vocal cords under direct vision, positive ETCO2 and breath sounds checked- equal and bilateral Secured at: 23 cm Tube secured with: Tape Dental Injury: Teeth and Oropharynx as per pre-operative assessment

## 2021-08-28 NOTE — Anesthesia Postprocedure Evaluation (Signed)
Anesthesia Post Note  Patient: Terry Horn.  Procedure(s) Performed: EXPLORATION LUMBAR FUSION, LUMBAR THREE-FOUR DECOMPRESSION INSTRUMENTATION AND FUSION (Back)     Patient location during evaluation: PACU Anesthesia Type: General Level of consciousness: awake and alert Pain management: pain level controlled Vital Signs Assessment: post-procedure vital signs reviewed and stable Respiratory status: spontaneous breathing, nonlabored ventilation, respiratory function stable and patient connected to nasal cannula oxygen Cardiovascular status: blood pressure returned to baseline and stable Postop Assessment: no apparent nausea or vomiting Anesthetic complications: no   No notable events documented.  Last Vitals:  Vitals:   08/28/21 2042 08/28/21 2055  BP: 129/70 132/67  Pulse: 76 76  Resp: 17 20  Temp: 36.6 C 36.7 C  SpO2: 96% 93%    Last Pain:  Vitals:   08/28/21 2055  TempSrc: Oral  PainSc:                  Tiajuana Amass

## 2021-08-28 NOTE — Transfer of Care (Signed)
Immediate Anesthesia Transfer of Care Note  Patient: Petar Mucci.  Procedure(s) Performed: EXPLORATION LUMBAR FUSION, LUMBAR THREE-FOUR DECOMPRESSION INSTRUMENTATION AND FUSION (Back)  Patient Location: PACU  Anesthesia Type:General  Level of Consciousness: awake  Airway & Oxygen Therapy: Patient Spontanous Breathing and Patient connected to face mask oxygen  Post-op Assessment: Report given to RN, Post -op Vital signs reviewed and stable and Patient moving all extremities X 4  Post vital signs: Reviewed and stable  Last Vitals:  Vitals Value Taken Time  BP 132/79 08/28/21 2006  Temp    Pulse 73 08/28/21 2011  Resp 15 08/28/21 2011  SpO2 89 % 08/28/21 2011  Vitals shown include unvalidated device data.  Last Pain:  Vitals:   08/28/21 1409  TempSrc:   PainSc: 10-Worst pain ever      Patients Stated Pain Goal: 3 (54/27/06 2376)  Complications: No notable events documented.

## 2021-08-28 NOTE — Progress Notes (Signed)
PROGRESS NOTE    Terry Horn.  YTR:[id number] DOB: [birth year] DOA: 08/27/2021 PCP: Jerrol Banana., MD   Brief Narrative:  Terry Mostafa. is a 61 y.o. male with PMH significant for morbid obesity, OSA on CPAP, HTN, CAD, GERD, arthritis. Patient was brought to ED from home on 6/27 for progressively worsening low back pain. December 2021, patient underwent, L4-L5 decompression fusion for lumbar radiculopathy history of stage, lumbar facet arthropathy. For the past several weeks, patient has intermittently severe progressively worsening low back pain.  He was seen by Dr. Arnoldo Morale and underwent MRI lumbar spine on 6/27.  It showed progressive degenerative spondylosis at L3-4 with resultant severe canal with right worse than left subarticular stenosis, with moderate right and severe left L3 foraminal narrowing. Prominent reactive marrow edema and enhancement at this level favored to be degenerative in nature. Superimposed infection was less likely but could not rule out. Patient met sepsis criteria at intake - started on antibiotics and hospitalist called for admission.   Patient transferred to main campus for neurosurgical evaluation.   Assessment & Plan:   Principal Problem:   Severe sepsis ([place]) Active Problems:   Cervicalgia   Spondylosis without myelopathy or radiculopathy, lumbosacral region   Lumbar facet arthropathy (L3-4, L4-L5, and L5-S1) (Bilateral)   Apnea, sleep   Body mass index 45.0-49.9, adult (HCC)   Arthritis   Chronic low back pain (Primary Area of Pain) (Bilateral) w/ sciatica (Left)   DDD (degenerative disc disease), lumbar   Cervical spondylolysis  Severe spinal canal stenosis Prior history of L4-L5 decompression - Progressively worsening intermittent severe low back pain  - NeuroSx following (Dr Arnoldo Morale) tentative plan for procedure today 08/28/21 - Given SIRS criteria and unable to rule out acute infection of the spine will continue broad spectrum  abx as below   Suspected severe sepsis Rule out osteo/intraspinal infection - Febrile to 100.5, lactic acid elevated 2.8 and tachycardic at intake (Previous facility) - Potential source of infection per MRI lumbar spine because of the presence of prominent reactive marrow edema. - Continue IV vancomycin for now - sepsis criteria resolved   Essential hypertension - Hold home medications - currently well controlled - IV hydralazine perioperatively as needed   Bilateral lower extremity edema -No evidence of DVT. -Ordered for echocardiogram.  Last echo from 2017 with EF 55 to 65%.   History of CAD -Currently no anginal symptoms -Currently on aspirin and statin. -I will keep aspirin on hold in progression of neurosurgery tomorrow.   Morbid obesity  -Body mass index is 47.28 kg/m. Patient has been advised to make an attempt to improve diet and exercise patterns to aid in weight loss.   OSA -Nightly CPAP.  Encouraged to bring his own CPAP machine from home.   BPH -Flomax, finasteride   Constipation -No BM in last 3 days.  Probably due to poor mobility and use of pain medicines. -Start Senokot and as needed laxative.   DVT prophylaxis: Heparin  Code Status: Full  Family Communication: None present  Status is: Inpatient   Dispo: The patient is from: Home              Anticipated d/c is to: TBD              Anticipated d/c date is: TBD >72h              Patient currently NOT medically stable for discharge given ongoing need for IV abx, imaging, and neurosurgical  workup/evaluation.  Consultants:  Neurosurgery  Procedures:  Pending neurosurgical evaluation  Antimicrobials:  Vancomycin  Subjective: No acute issues or events overnight, pain currently well controlled denies nausea vomiting diarrhea constipation headache fevers chills or chest pain  Objective: Vitals:   08/27/21 2343 08/28/21 0306 08/28/21 0805 08/28/21 1409  BP: 136/77 (!) 149/92 129/73 137/79  Pulse: 85  91 76 75  Resp: '19 18 18 20  ' Temp: 97.9 F (36.6 C) 98.4 F (36.9 C) 98.2 F (36.8 C)   TempSrc: Oral Oral    SpO2: 98% 98% 100% 97%    Intake/Output Summary (Last 24 hours) at 08/28/2021 1736 Last data filed at 08/28/2021 1733 Gross per 24 hour  Intake 370 ml  Output 1650 ml  Net -1280 ml   There were no vitals filed for this visit.  Examination:  General:  Pleasantly resting in bed, No acute distress. HEENT:  Normocephalic atraumatic.  Sclerae nonicteric, noninjected.  Extraocular movements intact bilaterally. Neck:  Without mass or deformity.  Trachea is midline. Lungs:  Clear to auscultate bilaterally without rhonchi, wheeze, or rales. Heart:  Regular rate and rhythm.  Without murmurs, rubs, or gallops. Abdomen:  Soft, nontender, nondistended.  Without guarding or rebound. Extremities: Without cyanosis, clubbing, edema, or obvious deformity. Vascular:  Dorsalis pedis and posterior tibial pulses palpable bilaterally. Skin:  Warm and dry, no erythema, no ulcerations.  Data Reviewed: I have personally reviewed following labs and imaging studies  CBC: Recent Labs  Lab 08/26/21 2212 08/27/21 0513 08/28/21 0434  WBC 10.2 9.0 8.6  NEUTROABS 7.2  --   --   HGB 13.3 11.7* 11.8*  HCT 42.6 36.9* 36.1*  MCV 88.8 87.9 84.9  PLT 301 255 016   Basic Metabolic Panel: Recent Labs  Lab 08/26/21 2212 08/27/21 0513 08/28/21 0434  NA 140 139 139  K 4.2 3.5 3.4*  CL 100 103 103  CO2 '29 27 24  ' GLUCOSE 100* 99 108*  BUN 27* 22* 12  CREATININE 0.75 0.59* 0.62  CALCIUM 9.2 8.7* 8.7*   GFR: Estimated Creatinine Clearance: 148.2 mL/min (by C-G formula based on SCr of 0.62 mg/dL). Liver Function Tests: Recent Labs  Lab 08/26/21 2212 08/27/21 0513  AST 18 14*  ALT 16 13  ALKPHOS 66 55  BILITOT 0.5 0.5  PROT 7.3 6.4*  ALBUMIN 3.7 3.3*   Recent Labs  Lab 08/26/21 2212  LIPASE 35   No results for input(s): "AMMONIA" in the last 168 hours. Coagulation Profile: Recent  Labs  Lab 08/26/21 2331  INR 1.2   Cardiac Enzymes: No results for input(s): "CKTOTAL", "CKMB", "CKMBINDEX", "TROPONINI" in the last 168 hours. BNP (last 3 results) No results for input(s): "PROBNP" in the last 8760 hours. HbA1C: No results for input(s): "HGBA1C" in the last 72 hours. CBG: No results for input(s): "GLUCAP" in the last 168 hours. Lipid Profile: No results for input(s): "CHOL", "HDL", "LDLCALC", "TRIG", "CHOLHDL", "LDLDIRECT" in the last 72 hours. Thyroid Function Tests: Recent Labs    08/27/21 0324  TSH 0.809   Anemia Panel: No results for input(s): "VITAMINB12", "FOLATE", "FERRITIN", "TIBC", "IRON", "RETICCTPCT" in the last 72 hours. Sepsis Labs: Recent Labs  Lab 08/26/21 2212 08/26/21 2332 08/27/21 0324  PROCALCITON <0.10  --  <0.10  LATICACIDVEN 2.8* 1.6  --     Recent Results (from the past 240 hour(s))  Blood Culture (routine x 2)     Status: None (Preliminary result)   Collection Time: 08/26/21 11:31 PM   Specimen: BLOOD  Result Value Ref Range Status   Specimen Description   Final    BLOOD RIGHT ASSIST CONTROL Performed at [facility], [address], [place], [place] [address]    Special Requests   Final    BOTTLES DRAWN AEROBIC AND ANAEROBIC Blood Culture adequate volume Performed at [facility], [address] [place], [place] [address]    Culture  Setup Time   Final    Organism ID to follow GRAM POSITIVE COCCI ANAEROBIC BOTTLE ONLY CRITICAL RESULT CALLED TO, READ BACK BY AND VERIFIED WITH: NATHAN BELUE AT 0100 ON 08/28/21 BY SS GRAM STAIN REVIEWED-AGREE WITH RESULT Performed at [facility], [place] [address], [place], [place] [address]    Culture GRAM POSITIVE COCCI  Final   Report Status PENDING  Incomplete  Blood Culture (routine x 2)     Status: None (Preliminary result)   Collection Time: 08/26/21 11:31 PM   Specimen: BLOOD  Result Value Ref Range Status   Specimen Description BLOOD LEFT ASSIST  CONTROL  Final   Special Requests   Final    BOTTLES DRAWN AEROBIC AND ANAEROBIC Blood Culture results may not be optimal due to an inadequate volume of blood received in culture bottles   Culture  Setup Time   Final    GRAM POSITIVE COCCI IN BOTH AEROBIC AND ANAEROBIC BOTTLES CRITICAL VALUE NOTED.  VALUE IS CONSISTENT WITH PREVIOUSLY REPORTED AND CALLED VALUE. Performed at [facility], [address] [place], [place] [address]    Culture [facility] POSITIVE COCCI  Final   Report Status PENDING  Incomplete  Blood Culture ID Panel (Reflexed)     Status: Abnormal   Collection Time: 08/26/21 11:31 PM  Result Value Ref Range Status   Enterococcus faecalis NOT DETECTED NOT DETECTED Final   Enterococcus Faecium NOT DETECTED NOT DETECTED Final   Listeria monocytogenes NOT DETECTED NOT DETECTED Final   Staphylococcus species DETECTED (A) NOT DETECTED Final    Comment: CRITICAL RESULT CALLED TO, READ BACK BY AND VERIFIED WITH: NATHAN BELUE AT 0100 ON 08/28/21 BY SS    Staphylococcus aureus (BCID) NOT DETECTED NOT DETECTED Final   Staphylococcus epidermidis NOT DETECTED NOT DETECTED Final   Staphylococcus lugdunensis NOT DETECTED NOT DETECTED Final   Streptococcus species NOT DETECTED NOT DETECTED Final   Streptococcus agalactiae NOT DETECTED NOT DETECTED Final   Streptococcus pneumoniae NOT DETECTED NOT DETECTED Final   Streptococcus pyogenes NOT DETECTED NOT DETECTED Final   A.calcoaceticus-baumannii NOT DETECTED NOT DETECTED Final   Bacteroides fragilis NOT DETECTED NOT DETECTED Final   Enterobacterales NOT DETECTED NOT DETECTED Final   Enterobacter cloacae complex NOT DETECTED NOT DETECTED Final   Escherichia coli NOT DETECTED NOT DETECTED Final   Klebsiella aerogenes NOT DETECTED NOT DETECTED Final   Klebsiella oxytoca NOT DETECTED NOT DETECTED Final   Klebsiella pneumoniae NOT DETECTED NOT DETECTED Final   Proteus species NOT DETECTED NOT DETECTED Final   Salmonella species NOT  DETECTED NOT DETECTED Final   Serratia marcescens NOT DETECTED NOT DETECTED Final   Haemophilus influenzae NOT DETECTED NOT DETECTED Final   Neisseria meningitidis NOT DETECTED NOT DETECTED Final   Pseudomonas aeruginosa NOT DETECTED NOT DETECTED Final   Stenotrophomonas maltophilia NOT DETECTED NOT DETECTED Final   Candida albicans NOT DETECTED NOT DETECTED Final   Candida auris NOT DETECTED NOT DETECTED Final   Candida glabrata NOT DETECTED NOT DETECTED Final   Candida krusei NOT DETECTED NOT DETECTED Final   Candida parapsilosis NOT DETECTED  NOT DETECTED Final   Candida tropicalis NOT DETECTED NOT DETECTED Final   Cryptococcus neoformans/gattii NOT DETECTED NOT DETECTED Final    Comment: Performed at [facility], [address], [place], [place] [address]  Surgical PCR screen     Status: None   Collection Time: 08/27/21  2:12 PM   Specimen: Nasal Mucosa; Nasal Swab  Result Value Ref Range Status   MRSA, PCR NEGATIVE NEGATIVE Final   Staphylococcus aureus NEGATIVE NEGATIVE Final    Comment: (NOTE) The Xpert SA Assay (FDA approved for NASAL specimens in patients 30 years of age and older), is one component of a comprehensive surveillance program. It is not intended to diagnose infection nor to guide or monitor treatment. Performed at [facility], [address], [place], [place] [address]          Radiology Studies: ECHOCARDIOGRAM COMPLETE  Result Date: 08/28/2021    ECHOCARDIOGRAM REPORT   Patient Name:   Terry Mally. Date of Exam: 08/27/2021 Medical Rec #:  [id number]            Height:       71.0 in Accession #:    [id number]           Weight:       339.0 lb Date of Birth:  1960-12-17            BSA:          2.639 m Patient Age:    22 years             BP:           107/62 mmHg Patient Gender: M                    HR:           78 bpm. Exam Location:  [facility] Procedure: 2D Echo, Cardiac Doppler and Color Doppler Indications:     Abnormal ECG 794.31 /  R94.31  History:         Patient has prior history of Echocardiogram examinations, most                  recent 09/24/2015. Risk Factors:Hypertension and Sleep Apnea.                  GERD.  Sonographer:     Sherrie Sport Referring Phys:  [id number] [facility] DAHAL Diagnosing Phys: Isaias Cowman MD  Sonographer Comments: Technically challenging study due to limited acoustic windows and suboptimal apical window. IMPRESSIONS  1. Left ventricular ejection fraction, by estimation, is 60 to 65%. The left ventricle has normal function. The left ventricle has no regional wall motion abnormalities. Left ventricular diastolic parameters were normal.  2. Right ventricular systolic function is normal. The right ventricular size is normal.  3. The mitral valve is normal in structure. Trivial mitral valve regurgitation. No evidence of mitral stenosis.  4. The aortic valve is normal in structure. Aortic valve regurgitation is not visualized. No aortic stenosis is present.  5. The inferior vena cava is normal in size with greater than 50% respiratory variability, suggesting right atrial pressure of 3 mmHg. FINDINGS  Left Ventricle: Left ventricular ejection fraction, by estimation, is 60 to 65%. The left ventricle has normal function. The left ventricle has no regional wall motion abnormalities. The left ventricular internal cavity size was normal in size. There is  no left ventricular hypertrophy. Left ventricular diastolic parameters were normal. Right Ventricle: The right  ventricular size is normal. No increase in right ventricular wall thickness. Right ventricular systolic function is normal. Left Atrium: Left atrial size was normal in size. Right Atrium: Right atrial size was normal in size. Pericardium: There is no evidence of pericardial effusion. Mitral Valve: The mitral valve is normal in structure. Trivial mitral valve regurgitation. No evidence of mitral valve stenosis. Tricuspid Valve: The tricuspid valve is normal in  structure. Tricuspid valve regurgitation is trivial. No evidence of tricuspid stenosis. Aortic Valve: The aortic valve is normal in structure. Aortic valve regurgitation is not visualized. No aortic stenosis is present. Aortic valve mean gradient measures 2.0 mmHg. Aortic valve peak gradient measures 2.7 mmHg. Aortic valve area, by VTI measures 2.48 cm. Pulmonic Valve: The pulmonic valve was normal in structure. Pulmonic valve regurgitation is not visualized. No evidence of pulmonic stenosis. Aorta: The aortic root is normal in size and structure. Venous: The inferior vena cava is normal in size with greater than 50% respiratory variability, suggesting right atrial pressure of 3 mmHg. IAS/Shunts: No atrial level shunt detected by color flow Doppler.  LEFT VENTRICLE PLAX 2D LVOT diam:     2.20 cm   Diastology LV SV:         34        LV e' medial:    6.09 cm/s LV SV Index:   13        LV E/e' medial:  14.2 LVOT Area:     3.80 cm  LV e' lateral:   11.30 cm/s                          LV E/e' lateral: 7.7  LEFT ATRIUM            Index        RIGHT ATRIUM           Index LA diam:      5.10 cm  1.93 cm/m   RA Area:     21.70 cm LA Vol (A4C): 107.0 ml 40.55 ml/m  RA Volume:   59.70 ml  22.63 ml/m  AORTIC VALVE AV Area (Vmax):    2.61 cm AV Area (Vmean):   2.23 cm AV Area (VTI):     2.48 cm AV Vmax:           81.80 cm/s AV Vmean:          56.700 cm/s AV VTI:            0.137 m AV Peak Grad:      2.7 mmHg AV Mean Grad:      2.0 mmHg LVOT Vmax:         56.10 cm/s LVOT Vmean:        33.300 cm/s LVOT VTI:          0.089 m LVOT/AV VTI ratio: 0.65  AORTA Ao Root diam: 3.27 cm MITRAL VALVE               TRICUSPID VALVE MV Area (PHT): 4.12 cm    TR Peak grad:   10.1 mmHg MV Decel Time: 184 msec    TR Vmax:        159.00 cm/s MV E velocity: 86.50 cm/s MV A velocity: 82.30 cm/s  SHUNTS MV E/A ratio:  1.05        Systemic VTI:  0.09 m  Systemic Diam: 2.20 cm Isaias Cowman MD Electronically  signed by Isaias Cowman MD Signature Date/Time: 08/28/2021/1:52:04 PM    Final    US Venous Img Lower Bilateral (DVT)  Result Date: 08/27/2021 CLINICAL DATA:  61 year old male with bilateral lower extremity edema. EXAM: BILATERAL LOWER EXTREMITY VENOUS DOPPLER ULTRASOUND TECHNIQUE: Gray-scale sonography with compression, as well as color and duplex ultrasound, were performed to evaluate the deep venous system(s) from the level of the common femoral vein through the popliteal and proximal calf veins. COMPARISON:  None Available. FINDINGS: VENOUS Normal compressibility of the bilateral common femoral, superficial femoral, and popliteal veins, as well as the visualized calf veins. Visualized portions of bilateral profunda femoral vein and great saphenous vein unremarkable. No filling defects to suggest DVT on grayscale or color Doppler imaging. Doppler waveforms show normal direction of venous flow, normal respiratory plasticity and response to augmentation. OTHER None. Limitations: none IMPRESSION: No evidence of bilateral lower extremity deep venous thrombosis. Electronically Signed   By: Genevie Ann M.D.   On: 08/27/2021 04:40   DG Chest Port 1 View  Result Date: 08/26/2021 CLINICAL DATA:  Sepsis EXAM: PORTABLE CHEST 1 VIEW COMPARISON:  None Available. FINDINGS: The heart size and mediastinal contours are within normal limits. Both lungs are clear. The visualized skeletal structures are unremarkable. IMPRESSION: No active disease. Electronically Signed   By: Fidela Salisbury M.D.   On: 08/26/2021 23:46     Scheduled Meds:  chlorhexidine       [MAR Hold] heparin  5,000 Units Subcutaneous Q8H   Continuous Infusions:  [MAR Hold] vancomycin 1,750 mg (08/28/21 0634)     LOS: 1 day   Time spent: 51mn  Ladora Osterberg C Yari Szeliga, DO [facility]  If 7PM-7AM, please contact night-coverage [email or web address]  08/28/2021, 5:36 PM

## 2021-08-28 NOTE — Plan of Care (Signed)

## 2021-08-28 NOTE — Progress Notes (Signed)
Subjective: The patient is alert and pleasant.  He complains of back pain with pain into his right leg.  Objective: Vital signs in last 24 hours: Temp:  [97.9 F (36.6 C)-99.2 F (37.3 C)] 98.2 F (36.8 C) (06/29 0805) Pulse Rate:  [75-91] 75 (06/29 1409) Resp:  [16-20] 20 (06/29 1409) BP: (119-149)/(57-92) 137/79 (06/29 1409) SpO2:  [95 %-100 %] 97 % (06/29 1409) Estimated body mass index is 47.28 kg/m as calculated from the following:   Height as of 08/26/21: '5\' 11"'$  (1.803 m).   Weight as of 08/26/21: 153.8 kg.   Intake/Output from previous day: 06/28 0701 - 06/29 0700 In: -  Out: 250 [Urine:250] Intake/Output this shift: Total I/O In: -  Out: 1400 [Urine:1400]  Physical exam the patient is alert and pleasant.  His strength is grossly normal.  Lab Results: Recent Labs    08/27/21 0513 08/28/21 0434  WBC 9.0 8.6  HGB 11.7* 11.8*  HCT 36.9* 36.1*  PLT 255 178   BMET Recent Labs    08/27/21 0513 08/28/21 0434  NA 139 139  K 3.5 3.4*  CL 103 103  CO2 27 24  GLUCOSE 99 108*  BUN 22* 12  CREATININE 0.59* 0.62  CALCIUM 8.7* 8.7*    Studies/Results: ECHOCARDIOGRAM COMPLETE  Result Date: 08/28/2021    ECHOCARDIOGRAM REPORT   Patient Name:   Terry Horn. Date of Exam: 08/27/2021 Medical Rec #:  [id number]            Height:       71.0 in Accession #:    [id number]           Weight:       339.0 lb Date of Birth:  1960/10/25            BSA:          2.639 m Patient Age:    61 years             BP:           107/62 mmHg Patient Gender: M                    HR:           78 bpm. Exam Location:  [facility] Procedure: 2D Echo, Cardiac Doppler and Color Doppler Indications:     Abnormal ECG 794.31 / R94.31  History:         Patient has prior history of Echocardiogram examinations, most                  recent 09/24/2015. Risk Factors:Hypertension and Sleep Apnea.                  GERD.  Sonographer:     Sherrie Sport Referring Phys:  [id number] [facility] Diagnosing Phys:  Isaias Cowman MD  Sonographer Comments: Technically challenging study due to limited acoustic windows and suboptimal apical window. IMPRESSIONS  1. Left ventricular ejection fraction, by estimation, is 60 to 65%. The left ventricle has normal function. The left ventricle has no regional wall motion abnormalities. Left ventricular diastolic parameters were normal.  2. Right ventricular systolic function is normal. The right ventricular size is normal.  3. The mitral valve is normal in structure. Trivial mitral valve regurgitation. No evidence of mitral stenosis.  4. The aortic valve is normal in structure. Aortic valve regurgitation is not visualized. No aortic stenosis is present.  5. The inferior vena cava is normal in  size with greater than 50% respiratory variability, suggesting right atrial pressure of 3 mmHg. FINDINGS  Left Ventricle: Left ventricular ejection fraction, by estimation, is 60 to 65%. The left ventricle has normal function. The left ventricle has no regional wall motion abnormalities. The left ventricular internal cavity size was normal in size. There is  no left ventricular hypertrophy. Left ventricular diastolic parameters were normal. Right Ventricle: The right ventricular size is normal. No increase in right ventricular wall thickness. Right ventricular systolic function is normal. Left Atrium: Left atrial size was normal in size. Right Atrium: Right atrial size was normal in size. Pericardium: There is no evidence of pericardial effusion. Mitral Valve: The mitral valve is normal in structure. Trivial mitral valve regurgitation. No evidence of mitral valve stenosis. Tricuspid Valve: The tricuspid valve is normal in structure. Tricuspid valve regurgitation is trivial. No evidence of tricuspid stenosis. Aortic Valve: The aortic valve is normal in structure. Aortic valve regurgitation is not visualized. No aortic stenosis is present. Aortic valve mean gradient measures 2.0 mmHg. Aortic  valve peak gradient measures 2.7 mmHg. Aortic valve area, by VTI measures 2.48 cm. Pulmonic Valve: The pulmonic valve was normal in structure. Pulmonic valve regurgitation is not visualized. No evidence of pulmonic stenosis. Aorta: The aortic root is normal in size and structure. Venous: The inferior vena cava is normal in size with greater than 50% respiratory variability, suggesting right atrial pressure of 3 mmHg. IAS/Shunts: No atrial level shunt detected by color flow Doppler.  LEFT VENTRICLE PLAX 2D LVOT diam:     2.20 cm   Diastology LV SV:         34        LV e' medial:    6.09 cm/s LV SV Index:   13        LV E/e' medial:  14.2 LVOT Area:     3.80 cm  LV e' lateral:   11.30 cm/s                          LV E/e' lateral: 7.7  LEFT ATRIUM            Index        RIGHT ATRIUM           Index LA diam:      5.10 cm  1.93 cm/m   RA Area:     21.70 cm LA Vol (A4C): 107.0 ml 40.55 ml/m  RA Volume:   59.70 ml  22.63 ml/m  AORTIC VALVE AV Area (Vmax):    2.61 cm AV Area (Vmean):   2.23 cm AV Area (VTI):     2.48 cm AV Vmax:           81.80 cm/s AV Vmean:          56.700 cm/s AV VTI:            0.137 m AV Peak Grad:      2.7 mmHg AV Mean Grad:      2.0 mmHg LVOT Vmax:         56.10 cm/s LVOT Vmean:        33.300 cm/s LVOT VTI:          0.089 m LVOT/AV VTI ratio: 0.65  AORTA Ao Root diam: 3.27 cm MITRAL VALVE               TRICUSPID VALVE MV Area (PHT): 4.12 cm    TR Peak grad:  10.1 mmHg MV Decel Time: 184 msec    TR Vmax:        159.00 cm/s MV E velocity: 86.50 cm/s MV A velocity: 82.30 cm/s  SHUNTS MV E/A ratio:  1.05        Systemic VTI:  0.09 m                            Systemic Diam: 2.20 cm Isaias Cowman MD Electronically signed by Isaias Cowman MD Signature Date/Time: 08/28/2021/1:52:04 PM    Final    US Venous Img Lower Bilateral (DVT)  Result Date: 08/27/2021 CLINICAL DATA:  61 year old male with bilateral lower extremity edema. EXAM: BILATERAL LOWER EXTREMITY VENOUS DOPPLER  ULTRASOUND TECHNIQUE: Gray-scale sonography with compression, as well as color and duplex ultrasound, were performed to evaluate the deep venous system(s) from the level of the common femoral vein through the popliteal and proximal calf veins. COMPARISON:  None Available. FINDINGS: VENOUS Normal compressibility of the bilateral common femoral, superficial femoral, and popliteal veins, as well as the visualized calf veins. Visualized portions of bilateral profunda femoral vein and great saphenous vein unremarkable. No filling defects to suggest DVT on grayscale or color Doppler imaging. Doppler waveforms show normal direction of venous flow, normal respiratory plasticity and response to augmentation. OTHER None. Limitations: none IMPRESSION: No evidence of bilateral lower extremity deep venous thrombosis. Electronically Signed   By: Genevie Ann M.D.   On: 08/27/2021 04:40   MR Lumbar Spine W Wo Contrast  Result Date: 08/27/2021 CLINICAL DATA:  Initial evaluation for progressively worsening lower back pain, right greater than left hip and buttock pain. EXAM: MRI LUMBAR SPINE WITHOUT AND WITH CONTRAST TECHNIQUE: Multiplanar and multiecho pulse sequences of the lumbar spine were obtained without and with intravenous contrast. CONTRAST:  25m GADAVIST GADOBUTROL 1 MMOL/ML IV SOLN COMPARISON:  Prior MRI from 12/28/2018. FINDINGS: Segmentation: Standard. Lowest well-formed disc space labeled the L5-S1 level. Alignment: Up to 4 mm retrolisthesis of L3 on L4, progressed from previous. Alignment otherwise normal with preservation of the normal lumbar lordosis. Vertebrae: Postoperative changes from prior PLIF at L4-5. Vertebral body height maintained without acute or chronic fracture. Bone marrow signal intensity within normal limits. No discrete or worrisome osseous lesions. Prominent reactive marrow edema and enhancement present about the L3-4 interspace, favored to be degenerative in nature. Superimposed infection somewhat  difficult to exclude, but felt to be unlikely. No other abnormal marrow edema or enhancement. Conus medullaris and cauda equina: Conus extends to the L1-2 level. Conus medullaris within normal limits. Nerve roots of the cauda equina mildly irregular proximal to the L3-4 level due to stenosis. Paraspinal and other soft tissues: Postoperative changes noted within the posterior paraspinous soft tissues. Paraspinous soft tissues demonstrate no other acute finding. Disc levels: L1-2: Negative interspace. Minimal facet spurring. No canal or foraminal stenosis. L2-3: Negative interspace. Mild facet hypertrophy. No significant canal or foraminal stenosis. L3-4: Progressive degenerative intervertebral disc space narrowing and 4 mm retrolisthesis. Associated diffuse disc bulge with reactive endplate spurring. Superimposed right subarticular disc protrusion with inferior migration (series 8, images 26, 27). Moderate facet and ligament flavum hypertrophy with associated small joint effusions. Resultant severe canal with severe right worse than left subarticular stenosis. Moderate right with severe left L3 foraminal narrowing. L4-5: Prior PLIF. No residual spinal stenosis. Residual mild to moderate left with mild right foraminal narrowing. L5-S1: Negative interspace. Moderate right with mild left facet hypertrophy. No spinal stenosis. Foramina remain  patent. IMPRESSION: 1. Progressive degenerative spondylosis at L3-4 with resultant severe canal with right worse than left subarticular stenosis, with moderate right and severe left L3 foraminal narrowing. Prominent reactive marrow edema and enhancement at this level favored to be degenerative in nature. Superimposed infection somewhat difficult to exclude, but felt to be unlikely. Correlation with any potential symptomatology and laboratory values recommended as warranted. 2. Prior PLIF at L4-5 without residual spinal stenosis. Electronically Signed   By: Jeannine Boga M.D.    On: 08/27/2021 00:56   DG Chest Port 1 View  Result Date: 08/26/2021 CLINICAL DATA:  Sepsis EXAM: PORTABLE CHEST 1 VIEW COMPARISON:  None Available. FINDINGS: The heart size and mediastinal contours are within normal limits. Both lungs are clear. The visualized skeletal structures are unremarkable. IMPRESSION: No active disease. Electronically Signed   By: Fidela Salisbury M.D.   On: 08/26/2021 23:46    Assessment/Plan: Lumbar spinal stenosis, lumbago, lumbar radiculopathy: I have discussed the situation with the patient.  I have answered all his questions regarding surgery.  He was to proceed with a L3-4 decompression, instrumentation and fusion.  LOS: 1 day     Ophelia Charter 08/28/2021, 3:39 PM

## 2021-08-28 NOTE — TOC Initial Note (Signed)
Transition of Care (TOC) - Initial/Assessment Note    Patient Details  Name: Terry Horn. MRN: [id number] Date of Birth: 1960-07-08  Transition of Care [facility]) CM/SW Contact:    Ninfa Meeker, RN Phone Number: 08/28/2021, 11:02 AM  Clinical Narrative:                 Transition of Care Screening Note:  Transition of Care Department [facility]) has reviewed patient and no TOC needs have been identified at this time. We will continue to monitor patient advancement through Interdisciplinary progressions. If new patient transition needs arise, please place a consult.          Patient Goals and CMS Choice        Expected Discharge Plan and Services                                                Prior Living Arrangements/Services                       Activities of Daily Living Home Assistive Devices/Equipment: Wheelchair ADL Screening (condition at time of admission) Patient's cognitive ability adequate to safely complete daily activities?: Yes Is the patient deaf or have difficulty hearing?: No Does the patient have difficulty seeing, even when wearing glasses/contacts?: No Does the patient have difficulty concentrating, remembering, or making decisions?: Yes Patient able to express need for assistance with ADLs?: No Does the patient have difficulty dressing or bathing?: Yes Independently performs ADLs?: Yes (appropriate for developmental age) Does the patient have difficulty walking or climbing stairs?: Yes Weakness of Legs: None Weakness of Arms/Hands: None  Permission Sought/Granted                  Emotional Assessment              Admission diagnosis:  Sepsis ([place]) [A41.9] Patient Active Problem List   Diagnosis Date Noted   Severe sepsis ([place]) 08/27/2021   Edema leg 08/27/2021   Coronary artery disease involving native coronary artery of native heart 10/10/2020   Spondylolisthesis, lumbar region 02/14/2020   Chronic knee  pain (Bilateral) 09/09/2017   Osteoarthritis of knee (Secondary) (Bilateral) 09/09/2017   Congenital Lumbar central spinal stenosis 09/09/2017   Cervical central spinal stenosis 09/09/2017   Carpal tunnel syndrome (Bilateral) 09/09/2017   Abnormal nerve conduction studies 09/09/2017   Osteoarthritis of spine with radiculopathy, lumbosacral region 09/09/2017   Osteoarthritis of facet joint of lumbar spine 09/09/2017   Osteoarthritis involving multiple joints on both sides of body 09/09/2017   Cervicalgia 09/09/2017   Chronic neck pain 09/09/2017   Numbness of upper extremity (Bilateral) 09/09/2017   Numbness of lower extremity (Left) 09/09/2017   Spondylosis without myelopathy or radiculopathy, lumbosacral region 09/09/2017   Lumbar facet syndrome (Midline pain pattern) (Bilateral) 09/09/2017   Lumbar facet arthropathy (L3-4, L4-L5, and L5-S1) (Bilateral) 09/09/2017   Low back pain 09/09/2017   Hyperglycemia 02/20/2016   Chronic low back pain (Primary Area of Pain) (Bilateral) w/ sciatica (Left) 02/20/2016   DDD (degenerative disc disease), lumbar 02/20/2016   Lumbar spondylosis 02/20/2016   DDD (degenerative disc disease), cervical 02/20/2016   Cervical spondylolysis 02/20/2016   History of colonic polyps 02/06/2016   Encounter for screening colonoscopy 01/21/2016   Mixed hyperlipidemia 10/01/2015   Chest pressure 07/26/2015   Paresthesia 07/26/2015  Arthritis 01/02/2015   Body mass index 45.0-49.9, adult ([place]) 01/02/2015   Essential (primary) hypertension 01/02/2015   Adiposity 01/02/2015   Apnea, sleep 01/02/2015   Avitaminosis D 01/02/2015   PCP:  Jerrol Banana., MD Pharmacy:   [facility] [id number] - [address], [place] [address] Phone: [phone] Fax: [phone]     Social Determinants of Health (SDOH) Interventions    Readmission Risk Interventions     No data to display

## 2021-08-28 NOTE — Anesthesia Preprocedure Evaluation (Addendum)
Anesthesia Evaluation  Patient identified by MRN, date of birth, ID band Patient awake    Reviewed: Allergy & Precautions, NPO status , Patient's Chart, lab work & pertinent test results  Airway Mallampati: II  TM Distance: >3 FB Neck ROM: Full    Dental no notable dental hx.    Pulmonary sleep apnea and Continuous Positive Airway Pressure Ventilation , former smoker,    Pulmonary exam normal        Cardiovascular hypertension, Pt. on medications + CAD   Rhythm:Regular Rate:Normal     Neuro/Psych negative neurological ROS  negative psych ROS   GI/Hepatic Neg liver ROS, GERD  ,  Endo/Other  Morbid obesity  Renal/GU negative Renal ROS  negative genitourinary   Musculoskeletal  (+) Arthritis , Osteoarthritis,    Abdominal Normal abdominal exam  (+)   Peds  Hematology negative hematology ROS (+)   Anesthesia Other Findings   Reproductive/Obstetrics                             Anesthesia Physical Anesthesia Plan  ASA: 3  Anesthesia Plan: General   Post-op Pain Management: Tylenol PO (pre-op)* and Celebrex PO (pre-op)*   Induction: Intravenous  PONV Risk Score and Plan: 2 and Ondansetron, Propofol infusion, Midazolam and Treatment may vary due to age or medical condition  Airway Management Planned: Mask and Oral ETT  Additional Equipment: None  Intra-op Plan:   Post-operative Plan: Extubation in OR  Informed Consent: I have reviewed the patients History and Physical, chart, labs and discussed the procedure including the risks, benefits and alternatives for the proposed anesthesia with the patient or authorized representative who has indicated his/her understanding and acceptance.     Dental advisory given  Plan Discussed with: CRNA  Anesthesia Plan Comments: (Lab Results      Component                Value               Date                      WBC                      8.6                  08/28/2021                HGB                      11.8 (L)            08/28/2021                HCT                      36.1 (L)            08/28/2021                MCV                      84.9                08/28/2021                PLT  178                 08/28/2021           Lab Results      Component                Value               Date                      NA                       139                 08/28/2021                K                        3.4 (L)             08/28/2021                CO2                      24                  08/28/2021                GLUCOSE                  108 (H)             08/28/2021                BUN                      12                  08/28/2021                CREATININE               0.62                08/28/2021                CALCIUM                  8.7 (L)             08/28/2021                EGFR                     102                 08/11/2021                GFRNONAA                 >60                 08/28/2021           ECHO 06/23: 1. Left ventricular ejection fraction, by estimation, is 60 to 65%. The  left ventricle has normal function. The left ventricle has no regional  wall motion abnormalities. Left ventricular diastolic parameters were  normal.  2. Right ventricular systolic function is normal. The right ventricular  size is normal.  3. The mitral valve is normal in structure. Trivial mitral valve  regurgitation. No evidence of mitral stenosis.  4. The aortic valve is normal in structure. Aortic valve regurgitation is  not visualized. No aortic stenosis is present.  5. The inferior vena cava is normal in size with greater than 50%  respiratory variability, suggesting right atrial pressure of 3 mmHg. )        Anesthesia Quick Evaluation

## 2021-08-28 NOTE — Progress Notes (Signed)
Paitent c/o nausea; MD notifieid. Received verbal order from Dr. Avon Gully to give zofran '4mg'$  IV x 1. Noted.

## 2021-08-28 NOTE — Progress Notes (Signed)
Orthopedic Tech Progress Note Patient Details:  Terry Horn 06/10/1960 [id number]  Ortho Devices Type of Ortho Device: Lumbar corsett Ortho Device/Splint Location: BACK Ortho Device/Splint Interventions: Ordered    Dropped off back brace in pt room.  [name] 08/28/2021, 9:40 PM

## 2021-08-29 DIAGNOSIS — A419 Sepsis, unspecified organism: Secondary | ICD-10-CM | POA: Diagnosis not present

## 2021-08-29 DIAGNOSIS — R6520 Severe sepsis without septic shock: Secondary | ICD-10-CM | POA: Diagnosis not present

## 2021-08-29 LAB — BASIC METABOLIC PANEL
Anion gap: 9 (ref 5–15)
BUN: 14 mg/dL (ref 6–20)
CO2: 24 mmol/L (ref 22–32)
Calcium: 8.6 mg/dL — ABNORMAL LOW (ref 8.9–10.3)
Chloride: 105 mmol/L (ref 98–111)
Creatinine, Ser: 0.61 mg/dL (ref 0.61–1.24)
GFR, Estimated: >60 mL/min (ref >60)
Glucose, Bld: 120 mg/dL — ABNORMAL HIGH (ref 70–99)
Potassium: 4.9 mmol/L (ref 3.5–5.1)
Sodium: 138 mmol/L (ref 135–145)

## 2021-08-29 LAB — CBC
HCT: 37.5 % — ABNORMAL LOW (ref 39.0–52.0)
Hemoglobin: 11.9 g/dL — ABNORMAL LOW (ref 13.0–17.0)
MCH: 27.5 pg (ref 26.0–34.0)
MCHC: 31.7 g/dL (ref 30.0–36.0)
MCV: 86.8 fL (ref 80.0–100.0)
Platelets: 252 10*3/uL (ref 150–400)
RBC: 4.32 MIL/uL (ref 4.22–5.81)
RDW: 12.5 % (ref 11.5–15.5)
WBC: 11.8 10*3/uL — ABNORMAL HIGH (ref 4.0–10.5)
nRBC: 0 % (ref 0.0–0.2)

## 2021-08-29 NOTE — Progress Notes (Signed)
Subjective: The patient is alert and pleasant.  He looks well.  His radicular pain is gone.  He has not mobilized yet.  The Foley was just taken out recently.  Objective: Vital signs in last 24 hours: Temp:  [97.6 F (36.4 C)-98.5 F (36.9 C)] 98.5 F (36.9 C) (06/30 0300) Pulse Rate:  [73-80] 73 (06/30 0300) Resp:  [17-20] 17 (06/30 0300) BP: (116-140)/(62-93) 116/62 (06/30 0300) SpO2:  [92 %-100 %] 96 % (06/30 0300) Estimated body mass index is 47.28 kg/m as calculated from the following:   Height as of 08/26/21: '5\' 11"'$  (1.803 m).   Weight as of 08/26/21: 153.8 kg.   Intake/Output from previous day: 06/29 0701 - 06/30 0700 In: 2350 [P.O.:480; I.V.:1400; Blood:100; IV Piggyback:370] Out: 2590 [Urine:2090; Blood:500] Intake/Output this shift: Total I/O In: 980 [P.O.:480; I.V.:400; Blood:100] Out: 440 [Urine:140; Blood:300]  Physical exam the patient is alert and oriented.  He is moving his lower extremities well.  Lab Results: Recent Labs    08/28/21 0434 08/29/21 0240  WBC 8.6 11.8*  HGB 11.8* 11.9*  HCT 36.1* 37.5*  PLT 178 252   BMET Recent Labs    08/28/21 0434 08/29/21 0240  NA 139 138  K 3.4* 4.9  CL 103 105  CO2 24 24  GLUCOSE 108* 120*  BUN 12 14  CREATININE 0.62 0.61  CALCIUM 8.7* 8.6*    Studies/Results: DG Lumbar Spine 2-3 Views  Result Date: 08/28/2021 CLINICAL DATA:  Lumbar fusion intraoperative spot radiographs. EXAM: AP AND LATERAL LUMBOSACRAL SPOT FLUOROSCOPIC VIEWS WERE PERFORMED INTRAOPERATIVELY Fluoro time: 9.2 seconds. Dose: 12.67 mGy. COMPARISON:  AP and lateral lumbar spine 08/15/2021. Lumbar MRI 08/26/2021 FINDINGS: Pre-existing bilateral pedicle screws and interbody metallic hardware are again noted at L4-5. Apparently the fusion rods had been removed at the time of the image. Bilateral pedicle screws have been inserted S1 with K3-T4 interbody metallic hardware also new in the interval. Fusion rods are not visible on either of the two  views. Please refer to Dr. Arnoldo Morale' operative note for further details and recommendations. IMPRESSION: Intraoperative radiographic findings as above. Electronically Signed   By: Telford Nab M.D.   On: 08/28/2021 20:18   DG C-Arm 1-60 Min-No Report  Result Date: 08/28/2021 Fluoroscopy was utilized by the requesting physician.  No radiographic interpretation.   DG C-Arm 1-60 Min-No Report  Result Date: 08/28/2021 Fluoroscopy was utilized by the requesting physician.  No radiographic interpretation.   ECHOCARDIOGRAM COMPLETE  Result Date: 08/28/2021    ECHOCARDIOGRAM REPORT   Patient Name:   Terry Horn. Date of Exam: 08/27/2021 Medical Rec #:  [id number]            Height:       71.0 in Accession #:    [id number]           Weight:       339.0 lb Date of Birth:  [birth year]            BSA:          2.639 m Patient Age:    55 years             BP:           107/62 mmHg Patient Gender: M                    HR:           78 bpm. Exam Location:  [facility] Procedure: 2D Echo, Cardiac Doppler and  Color Doppler Indications:     Abnormal ECG 794.31 / R94.31  History:         Patient has prior history of Echocardiogram examinations, most                  recent 09/24/2015. Risk Factors:Hypertension and Sleep Apnea.                  GERD.  Sonographer:     Sherrie Sport Referring Phys:  [id number] [facility] Diagnosing Phys: Isaias Cowman MD  Sonographer Comments: Technically challenging study due to limited acoustic windows and suboptimal apical window. IMPRESSIONS  1. Left ventricular ejection fraction, by estimation, is 60 to 65%. The left ventricle has normal function. The left ventricle has no regional wall motion abnormalities. Left ventricular diastolic parameters were normal.  2. Right ventricular systolic function is normal. The right ventricular size is normal.  3. The mitral valve is normal in structure. Trivial mitral valve regurgitation. No evidence of mitral stenosis.  4. The aortic valve is normal  in structure. Aortic valve regurgitation is not visualized. No aortic stenosis is present.  5. The inferior vena cava is normal in size with greater than 50% respiratory variability, suggesting right atrial pressure of 3 mmHg. FINDINGS  Left Ventricle: Left ventricular ejection fraction, by estimation, is 60 to 65%. The left ventricle has normal function. The left ventricle has no regional wall motion abnormalities. The left ventricular internal cavity size was normal in size. There is  no left ventricular hypertrophy. Left ventricular diastolic parameters were normal. Right Ventricle: The right ventricular size is normal. No increase in right ventricular wall thickness. Right ventricular systolic function is normal. Left Atrium: Left atrial size was normal in size. Right Atrium: Right atrial size was normal in size. Pericardium: There is no evidence of pericardial effusion. Mitral Valve: The mitral valve is normal in structure. Trivial mitral valve regurgitation. No evidence of mitral valve stenosis. Tricuspid Valve: The tricuspid valve is normal in structure. Tricuspid valve regurgitation is trivial. No evidence of tricuspid stenosis. Aortic Valve: The aortic valve is normal in structure. Aortic valve regurgitation is not visualized. No aortic stenosis is present. Aortic valve mean gradient measures 2.0 mmHg. Aortic valve peak gradient measures 2.7 mmHg. Aortic valve area, by VTI measures 2.48 cm. Pulmonic Valve: The pulmonic valve was normal in structure. Pulmonic valve regurgitation is not visualized. No evidence of pulmonic stenosis. Aorta: The aortic root is normal in size and structure. Venous: The inferior vena cava is normal in size with greater than 50% respiratory variability, suggesting right atrial pressure of 3 mmHg. IAS/Shunts: No atrial level shunt detected by color flow Doppler.  LEFT VENTRICLE PLAX 2D LVOT diam:     2.20 cm   Diastology LV SV:         34        LV e' medial:    6.09 cm/s LV SV  Index:   13        LV E/e' medial:  14.2 LVOT Area:     3.80 cm  LV e' lateral:   11.30 cm/s                          LV E/e' lateral: 7.7  LEFT ATRIUM            Index        RIGHT ATRIUM           Index LA diam:  5.10 cm  1.93 cm/m   RA Area:     21.70 cm LA Vol (A4C): 107.0 ml 40.55 ml/m  RA Volume:   59.70 ml  22.63 ml/m  AORTIC VALVE AV Area (Vmax):    2.61 cm AV Area (Vmean):   2.23 cm AV Area (VTI):     2.48 cm AV Vmax:           81.80 cm/s AV Vmean:          56.700 cm/s AV VTI:            0.137 m AV Peak Grad:      2.7 mmHg AV Mean Grad:      2.0 mmHg LVOT Vmax:         56.10 cm/s LVOT Vmean:        33.300 cm/s LVOT VTI:          0.089 m LVOT/AV VTI ratio: 0.65  AORTA Ao Root diam: 3.27 cm MITRAL VALVE               TRICUSPID VALVE MV Area (PHT): 4.12 cm    TR Peak grad:   10.1 mmHg MV Decel Time: 184 msec    TR Vmax:        159.00 cm/s MV E velocity: 86.50 cm/s MV A velocity: 82.30 cm/s  SHUNTS MV E/A ratio:  1.05        Systemic VTI:  0.09 m                            Systemic Diam: 2.20 cm Isaias Cowman MD Electronically signed by Isaias Cowman MD Signature Date/Time: 08/28/2021/1:52:04 PM    Final     Assessment/Plan: Postop day #1: The patient is doing well.  We will mobilize him with PT and OT.  He will likely go home tomorrow.  I gave him his discharge instructions and answered all his questions.  LOS: 2 days     Ophelia Charter 08/29/2021, 6:52 AM     Patient ID: Terry Horn., male   DOB: Jan 31, 1961, 61 y.o.   MRN: [id number]

## 2021-08-29 NOTE — Plan of Care (Signed)
Patient is alert and oriented X4. Moves all extremities equally, no numbness or tingling. Has adequate pain control. Back brace applied. Able to advance diet to regular.  Problem: Education: Goal: Knowledge of General Education information will improve Description: Including pain rating scale, medication(s)/side effects and non-pharmacologic comfort measures Outcome: Progressing   Problem: Activity: Goal: Risk for activity intolerance will decrease Outcome: Progressing   Problem: Nutrition: Goal: Adequate nutrition will be maintained Outcome: Progressing   Problem: Elimination: Goal: Will not experience complications related to bowel motility Outcome: Progressing   Problem: Pain Managment: Goal: General experience of comfort will improve Outcome: Progressing   Problem: Safety: Goal: Ability to remain free from injury will improve Outcome: Progressing   Problem: Skin Integrity: Goal: Risk for impaired skin integrity will decrease Outcome: Progressing

## 2021-08-29 NOTE — Progress Notes (Signed)
PROGRESS NOTE    Terry Horn.  NUU:[id number] DOB: 1961/02/17 DOA: 08/27/2021 PCP: Jerrol Banana., MD   Brief Narrative:  Terry Horn. is a 61 y.o. male with PMH significant for morbid obesity, OSA on CPAP, HTN, CAD, GERD, arthritis. Patient was brought to ED from home on 6/27 for progressively worsening low back pain. December 2021, patient underwent, L4-L5 decompression fusion for lumbar radiculopathy history of stage, lumbar facet arthropathy. For the past several weeks, patient has intermittently severe progressively worsening low back pain.  He was seen by Dr. Arnoldo Morale and underwent MRI lumbar spine on 6/27.  It showed progressive degenerative spondylosis at L3-4 with resultant severe canal with right worse than left subarticular stenosis, with moderate right and severe left L3 foraminal narrowing. Prominent reactive marrow edema and enhancement at this level favored to be degenerative in nature. Superimposed infection was less likely but could not rule out. Patient met sepsis criteria at intake - started on antibiotics and hospitalist called for admission.   Patient transferred to main campus for neurosurgical evaluation/procedure.  Assessment & Plan:   Principal Problem:   Severe sepsis ([place]) Active Problems:   Cervicalgia   Spondylosis without myelopathy or radiculopathy, lumbosacral region   Lumbar facet arthropathy (L3-4, L4-L5, and L5-S1) (Bilateral)   Apnea, sleep   Body mass index 45.0-49.9, adult (HCC)   Arthritis   Chronic low back pain (Primary Area of Pain) (Bilateral) w/ sciatica (Left)   DDD (degenerative disc disease), lumbar   Cervical spondylolysis  Severe spinal canal stenosis, prior history of L4-L5 decompression - L3/4 decompression/fusion 08/28/21 secondary to profound -lumbar spinal stenosis, lumbago, lumbar radiculopathy - NeuroSx following (Dr Arnoldo Morale) - Given SIRS criteria and unable to rule out acute infection of the spine will  continue broad spectrum abx as below - operative cultures pending - Blood cultures + x1 - staph capitis - likely contaminant given literature review; will follow along   Suspected severe sepsis Rule out osteo/intraspinal infection - Febrile to 100.5, lactic acid elevated 2.8 and tachycardic at intake (Previous facility) - Potential source of infection per MRI lumbar spine because of the presence of prominent reactive marrow edema. - Continue IV vancomycin for now - sepsis criteria resolved   Essential hypertension - Hold home medications - currently well controlled - IV hydralazine perioperatively as needed   Bilateral lower extremity edema -No evidence of DVT. -Echocardiogram unremarkable.   History of CAD -Currently no anginal symptoms -Currently on aspirin and statin. -I will keep aspirin on hold in progression of neurosurgery tomorrow.   Morbid obesity  -Body mass index is 47.28 kg/m. Patient has been advised to make an attempt to improve diet and exercise patterns to aid in weight loss.   OSA -Nightly CPAP.  Encouraged to bring his own CPAP machine from home.   BPH -Flomax, finasteride   Constipation -No BM in last 3 days.  Probably due to poor mobility and use of pain medicines. -Start Senokot and as needed laxative.   DVT prophylaxis: Heparin  Code Status: Full  Family Communication: None present  Status is: Inpatient   Dispo: The patient is from: Home              Anticipated d/c is to: TBD              Anticipated d/c date is: TBD >72h              Patient currently NOT medically stable for discharge given ongoing  need for IV abx, imaging, and neurosurgical workup/evaluation.  Consultants:  Neurosurgery  Procedures:  L3/4 decompression/fusion 08/28/21  Antimicrobials:  Vancomycin  Subjective: No acute issues or events overnight, pain currently well controlled - getting ready to work with PT  Objective: Vitals:   08/28/21 2206 08/28/21 2250 08/28/21  2300 08/29/21 0300  BP: 137/64 140/62 130/69 116/62  Pulse: 77 73 78 73  Resp:   18 17  Temp:   98.3 F (36.8 C) 98.5 F (36.9 C)  TempSrc:   Oral Oral  SpO2: 96%  98% 96%    Intake/Output Summary (Last 24 hours) at 08/29/2021 0747 Last data filed at 08/29/2021 0400 Gross per 24 hour  Intake 2350 ml  Output 2590 ml  Net -240 ml    There were no vitals filed for this visit.  Examination:  General:  Pleasantly resting in bed, No acute distress. HEENT:  Normocephalic atraumatic.  Sclerae nonicteric, noninjected.  Extraocular movements intact bilaterally. Neck:  Without mass or deformity.  Trachea is midline. Lungs:  Clear to auscultate bilaterally without rhonchi, wheeze, or rales. Heart:  Regular rate and rhythm.  Without murmurs, rubs, or gallops. Abdomen:  Soft, nontender, nondistended.  Without guarding or rebound. Extremities: Without cyanosis, clubbing, edema, or obvious deformity. Vascular:  Dorsalis pedis and posterior tibial pulses palpable bilaterally. Skin:  Warm and dry, no erythema, no ulcerations.  Data Reviewed: I have personally reviewed following labs and imaging studies  CBC: Recent Labs  Lab 08/26/21 2212 08/27/21 0513 08/28/21 0434 08/29/21 0240  WBC 10.2 9.0 8.6 11.8*  NEUTROABS 7.2  --   --   --   HGB 13.3 11.7* 11.8* 11.9*  HCT 42.6 36.9* 36.1* 37.5*  MCV 88.8 87.9 84.9 86.8  PLT 301 255 178 532    Basic Metabolic Panel: Recent Labs  Lab 08/26/21 2212 08/27/21 0513 08/28/21 0434 08/29/21 0240  NA 140 139 139 138  K 4.2 3.5 3.4* 4.9  CL 100 103 103 105  CO2 '29 27 24 24  ' GLUCOSE 100* 99 108* 120*  BUN 27* 22* 12 14  CREATININE 0.75 0.59* 0.62 0.61  CALCIUM 9.2 8.7* 8.7* 8.6*    GFR: Estimated Creatinine Clearance: 148.2 mL/min (by C-G formula based on SCr of 0.61 mg/dL). Liver Function Tests: Recent Labs  Lab 08/26/21 2212 08/27/21 0513  AST 18 14*  ALT 16 13  ALKPHOS 66 55  BILITOT 0.5 0.5  PROT 7.3 6.4*  ALBUMIN 3.7 3.3*     Recent Labs  Lab 08/26/21 2212  LIPASE 35    No results for input(s): "AMMONIA" in the last 168 hours. Coagulation Profile: Recent Labs  Lab 08/26/21 2331  INR 1.2    Cardiac Enzymes: No results for input(s): "CKTOTAL", "CKMB", "CKMBINDEX", "TROPONINI" in the last 168 hours. BNP (last 3 results) No results for input(s): "PROBNP" in the last 8760 hours. HbA1C: No results for input(s): "HGBA1C" in the last 72 hours. CBG: No results for input(s): "GLUCAP" in the last 168 hours. Lipid Profile: No results for input(s): "CHOL", "HDL", "LDLCALC", "TRIG", "CHOLHDL", "LDLDIRECT" in the last 72 hours. Thyroid Function Tests: Recent Labs    08/27/21 0324  TSH 0.809    Anemia Panel: No results for input(s): "VITAMINB12", "FOLATE", "FERRITIN", "TIBC", "IRON", "RETICCTPCT" in the last 72 hours. Sepsis Labs: Recent Labs  Lab 08/26/21 2212 08/26/21 2332 08/27/21 0324  PROCALCITON <0.10  --  <0.10  LATICACIDVEN 2.8* 1.6  --      Recent Results (from the past 240  hour(s))  Blood Culture (routine x 2)     Status: None (Preliminary result)   Collection Time: 08/26/21 11:31 PM   Specimen: BLOOD  Result Value Ref Range Status   Specimen Description   Final    BLOOD RIGHT ASSIST CONTROL Performed at [facility], [address] [place], [place] [address]    Special Requests   Final    BOTTLES DRAWN AEROBIC AND ANAEROBIC Blood Culture adequate volume Performed at [facility], [address] [place], [place] [address]    Culture  Setup Time   Final    Organism ID to follow GRAM POSITIVE COCCI ANAEROBIC BOTTLE ONLY CRITICAL RESULT CALLED TO, READ BACK BY AND VERIFIED WITH: NATHAN BELUE AT 0100 ON 08/28/21 BY SS GRAM STAIN REVIEWED-AGREE WITH RESULT Performed at [facility], [place] [address] [place], [place] [address]    Culture GRAM POSITIVE COCCI  Final   Report Status PENDING  Incomplete  Blood Culture (routine x 2)     Status: None  (Preliminary result)   Collection Time: 08/26/21 11:31 PM   Specimen: BLOOD  Result Value Ref Range Status   Specimen Description BLOOD LEFT ASSIST CONTROL  Final   Special Requests   Final    BOTTLES DRAWN AEROBIC AND ANAEROBIC Blood Culture results may not be optimal due to an inadequate volume of blood received in culture bottles   Culture  Setup Time   Final    GRAM POSITIVE COCCI IN BOTH AEROBIC AND ANAEROBIC BOTTLES CRITICAL VALUE NOTED.  VALUE IS CONSISTENT WITH PREVIOUSLY REPORTED AND CALLED VALUE. Performed at [facility], [address] [place], [place] [address]    Culture [facility] POSITIVE COCCI  Final   Report Status PENDING  Incomplete  Blood Culture ID Panel (Reflexed)     Status: Abnormal   Collection Time: 08/26/21 11:31 PM  Result Value Ref Range Status   Enterococcus faecalis NOT DETECTED NOT DETECTED Final   Enterococcus Faecium NOT DETECTED NOT DETECTED Final   Listeria monocytogenes NOT DETECTED NOT DETECTED Final   Staphylococcus species DETECTED (A) NOT DETECTED Final    Comment: CRITICAL RESULT CALLED TO, READ BACK BY AND VERIFIED WITH: NATHAN BELUE AT 0100 ON 08/28/21 BY SS    Staphylococcus aureus (BCID) NOT DETECTED NOT DETECTED Final   Staphylococcus epidermidis NOT DETECTED NOT DETECTED Final   Staphylococcus lugdunensis NOT DETECTED NOT DETECTED Final   Streptococcus species NOT DETECTED NOT DETECTED Final   Streptococcus agalactiae NOT DETECTED NOT DETECTED Final   Streptococcus pneumoniae NOT DETECTED NOT DETECTED Final   Streptococcus pyogenes NOT DETECTED NOT DETECTED Final   A.calcoaceticus-baumannii NOT DETECTED NOT DETECTED Final   Bacteroides fragilis NOT DETECTED NOT DETECTED Final   Enterobacterales NOT DETECTED NOT DETECTED Final   Enterobacter cloacae complex NOT DETECTED NOT DETECTED Final   Escherichia coli NOT DETECTED NOT DETECTED Final   Klebsiella aerogenes NOT DETECTED NOT DETECTED Final   Klebsiella oxytoca NOT DETECTED  NOT DETECTED Final   Klebsiella pneumoniae NOT DETECTED NOT DETECTED Final   Proteus species NOT DETECTED NOT DETECTED Final   Salmonella species NOT DETECTED NOT DETECTED Final   Serratia marcescens NOT DETECTED NOT DETECTED Final   Haemophilus influenzae NOT DETECTED NOT DETECTED Final   Neisseria meningitidis NOT DETECTED NOT DETECTED Final   Pseudomonas aeruginosa NOT DETECTED NOT DETECTED Final   Stenotrophomonas maltophilia NOT DETECTED NOT DETECTED Final   Candida albicans NOT DETECTED NOT DETECTED Final   Candida auris NOT DETECTED  NOT DETECTED Final   Candida glabrata NOT DETECTED NOT DETECTED Final   Candida krusei NOT DETECTED NOT DETECTED Final   Candida parapsilosis NOT DETECTED NOT DETECTED Final   Candida tropicalis NOT DETECTED NOT DETECTED Final   Cryptococcus neoformans/gattii NOT DETECTED NOT DETECTED Final    Comment: Performed at [facility], [address], [place], [place] [address]  Surgical PCR screen     Status: None   Collection Time: 08/27/21  2:12 PM   Specimen: Nasal Mucosa; Nasal Swab  Result Value Ref Range Status   MRSA, PCR NEGATIVE NEGATIVE Final   Staphylococcus aureus NEGATIVE NEGATIVE Final    Comment: (NOTE) The Xpert SA Assay (FDA approved for NASAL specimens in patients 38 years of age and older), is one component of a comprehensive surveillance program. It is not intended to diagnose infection nor to guide or monitor treatment. Performed at [facility], [address] [place], [place] [address]   Aerobic/Anaerobic Culture w Gram Stain (surgical/deep wound)     Status: None (Preliminary result)   Collection Time: 08/28/21  6:27 PM   Specimen: Wound; Tissue  Result Value Ref Range Status   Specimen Description WOUND  Final   Special Requests LUMBAR 3,4 DISC SPACE  Final   Gram Stain   Final    NO SQUAMOUS EPITHELIAL CELLS SEEN FEW WBC SEEN NO ORGANISMS SEEN    Culture   Final    NO GROWTH < 12 HOURS Performed  at [facility], [place] [address] [place], [place] [address]    Report Status PENDING  Incomplete         Radiology Studies: DG Lumbar Spine 2-3 Views  Result Date: 08/28/2021 CLINICAL DATA:  Lumbar fusion intraoperative spot radiographs. EXAM: AP AND LATERAL LUMBOSACRAL SPOT FLUOROSCOPIC VIEWS WERE PERFORMED INTRAOPERATIVELY Fluoro time: 9.2 seconds. Dose: 12.67 mGy. COMPARISON:  AP and lateral lumbar spine 08/15/2021. Lumbar MRI 08/26/2021 FINDINGS: Pre-existing bilateral pedicle screws and interbody metallic hardware are again noted at L4-5. Apparently the fusion rods had been removed at the time of the image. Bilateral pedicle screws have been inserted S1 with C3-J6 interbody metallic hardware also new in the interval. Fusion rods are not visible on either of the two views. Please refer to Dr. Arnoldo Morale' operative note for further details and recommendations. IMPRESSION: Intraoperative radiographic findings as above. Electronically Signed   By: Telford Nab M.D.   On: 08/28/2021 20:18   DG C-Arm 1-60 Min-No Report  Result Date: 08/28/2021 Fluoroscopy was utilized by the requesting physician.  No radiographic interpretation.   DG C-Arm 1-60 Min-No Report  Result Date: 08/28/2021 Fluoroscopy was utilized by the requesting physician.  No radiographic interpretation.   ECHOCARDIOGRAM COMPLETE  Result Date: 08/28/2021    ECHOCARDIOGRAM REPORT   Patient Name:   Terry Horn. Date of Exam: 08/27/2021 Medical Rec #:  [id number]            Height:       71.0 in Accession #:    [id number]           Weight:       339.0 lb Date of Birth:  1960/04/17            BSA:          2.639 m Patient Age:    23 years             BP:           107/62 mmHg Patient  Gender: M                    HR:           78 bpm. Exam Location:  [facility] Procedure: 2D Echo, Cardiac Doppler and Color Doppler Indications:     Abnormal ECG 794.31 / R94.31  History:         Patient has prior history of Echocardiogram  examinations, most                  recent 09/24/2015. Risk Factors:Hypertension and Sleep Apnea.                  GERD.  Sonographer:     Sherrie Sport Referring Phys:  [id number] [facility] DAHAL Diagnosing Phys: Isaias Cowman MD  Sonographer Comments: Technically challenging study due to limited acoustic windows and suboptimal apical window. IMPRESSIONS  1. Left ventricular ejection fraction, by estimation, is 60 to 65%. The left ventricle has normal function. The left ventricle has no regional wall motion abnormalities. Left ventricular diastolic parameters were normal.  2. Right ventricular systolic function is normal. The right ventricular size is normal.  3. The mitral valve is normal in structure. Trivial mitral valve regurgitation. No evidence of mitral stenosis.  4. The aortic valve is normal in structure. Aortic valve regurgitation is not visualized. No aortic stenosis is present.  5. The inferior vena cava is normal in size with greater than 50% respiratory variability, suggesting right atrial pressure of 3 mmHg. FINDINGS  Left Ventricle: Left ventricular ejection fraction, by estimation, is 60 to 65%. The left ventricle has normal function. The left ventricle has no regional wall motion abnormalities. The left ventricular internal cavity size was normal in size. There is  no left ventricular hypertrophy. Left ventricular diastolic parameters were normal. Right Ventricle: The right ventricular size is normal. No increase in right ventricular wall thickness. Right ventricular systolic function is normal. Left Atrium: Left atrial size was normal in size. Right Atrium: Right atrial size was normal in size. Pericardium: There is no evidence of pericardial effusion. Mitral Valve: The mitral valve is normal in structure. Trivial mitral valve regurgitation. No evidence of mitral valve stenosis. Tricuspid Valve: The tricuspid valve is normal in structure. Tricuspid valve regurgitation is trivial. No evidence of  tricuspid stenosis. Aortic Valve: The aortic valve is normal in structure. Aortic valve regurgitation is not visualized. No aortic stenosis is present. Aortic valve mean gradient measures 2.0 mmHg. Aortic valve peak gradient measures 2.7 mmHg. Aortic valve area, by VTI measures 2.48 cm. Pulmonic Valve: The pulmonic valve was normal in structure. Pulmonic valve regurgitation is not visualized. No evidence of pulmonic stenosis. Aorta: The aortic root is normal in size and structure. Venous: The inferior vena cava is normal in size with greater than 50% respiratory variability, suggesting right atrial pressure of 3 mmHg. IAS/Shunts: No atrial level shunt detected by color flow Doppler.  LEFT VENTRICLE PLAX 2D LVOT diam:     2.20 cm   Diastology LV SV:         34        LV e' medial:    6.09 cm/s LV SV Index:   13        LV E/e' medial:  14.2 LVOT Area:     3.80 cm  LV e' lateral:   11.30 cm/s  LV E/e' lateral: 7.7  LEFT ATRIUM            Index        RIGHT ATRIUM           Index LA diam:      5.10 cm  1.93 cm/m   RA Area:     21.70 cm LA Vol (A4C): 107.0 ml 40.55 ml/m  RA Volume:   59.70 ml  22.63 ml/m  AORTIC VALVE AV Area (Vmax):    2.61 cm AV Area (Vmean):   2.23 cm AV Area (VTI):     2.48 cm AV Vmax:           81.80 cm/s AV Vmean:          56.700 cm/s AV VTI:            0.137 m AV Peak Grad:      2.7 mmHg AV Mean Grad:      2.0 mmHg LVOT Vmax:         56.10 cm/s LVOT Vmean:        33.300 cm/s LVOT VTI:          0.089 m LVOT/AV VTI ratio: 0.65  AORTA Ao Root diam: 3.27 cm MITRAL VALVE               TRICUSPID VALVE MV Area (PHT): 4.12 cm    TR Peak grad:   10.1 mmHg MV Decel Time: 184 msec    TR Vmax:        159.00 cm/s MV E velocity: 86.50 cm/s MV A velocity: 82.30 cm/s  SHUNTS MV E/A ratio:  1.05        Systemic VTI:  0.09 m                            Systemic Diam: 2.20 cm Isaias Cowman MD Electronically signed by Isaias Cowman MD Signature Date/Time: 08/28/2021/1:52:04  PM    Final      Scheduled Meds:  acetaminophen  1,000 mg Oral Q6H   docusate sodium  100 mg Oral BID   finasteride  5 mg Oral Daily   gabapentin  300 mg Oral q AM   gabapentin  600 mg Oral QHS   losartan  100 mg Oral Daily   And   hydrochlorothiazide  12.5 mg Oral Daily   pravastatin  80 mg Oral Daily   sodium chloride flush  3 mL Intravenous Q12H   tamsulosin  0.4 mg Oral Daily   Continuous Infusions:  sodium chloride 250 mL (08/28/21 2337)    ceFAZolin (ANCEF) IV 2 g (08/28/21 2346)   vancomycin 1,750 mg (08/29/21 0618)     LOS: 2 days   Time spent: 19mn  Jametta Moorehead C Sohana Austell, DO [facility]  If 7PM-7AM, please contact night-coverage [email or web address]  08/29/2021, 7:47 AM

## 2021-08-29 NOTE — Evaluation (Signed)
Physical Therapy Evaluation Patient Details Name: Terry Horn. MRN: [id number] DOB: 1960-03-18 Today's Date: 08/29/2021  History of Present Illness  Pt is a 61 y.o. male s/p PLIF. PMH: h/o previous back sx (2021), arthritis, GERD, HTN, PNA  Clinical Impression  Patient is s/p above surgery resulting in the deficits listed below (see PT Problem List). 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 RN reported low BP this AM. BP stable during session. Patient will benefit from skilled PT to increase their independence and safety with mobility (while adhering to their precautions) to allow discharge to the venue listed below. Pt reports family is able to provide needed level of assist.         Recommendations for follow up therapy are one component of a multi-disciplinary discharge planning process, led by the attending physician.  Recommendations may be updated based on patient status, additional functional criteria and insurance authorization.  Follow Up Recommendations Home health PT      Assistance Recommended at Discharge PRN  Patient can return home with the following  A little help with walking and/or transfers;Help with stairs or ramp for entrance;Assist for transportation;Assistance with cooking/housework;A lot of help with bathing/dressing/bathroom    Equipment Recommendations None recommended by PT  Recommendations for Other Services       Functional Status Assessment Patient has had a recent decline in their functional status and demonstrates the ability to make significant improvements in function in a reasonable and predictable amount of time.     Precautions / Restrictions Precautions Precautions: Fall Precaution Comments: Pt educated on back precautions. Required Braces or Orthoses:  Spinal Brace Spinal Brace: Lumbar corset;Applied in sitting position      Mobility  Bed Mobility Overal bed mobility: Needs Assistance Bed Mobility: Rolling, Sidelying to Sit Rolling: Max assist Sidelying to sit: +2 for physical assistance, +2 for safety/equipment, Max assist       General bed mobility comments: +rail, increased time, heavy posterior lean initially, use of bed pad to scoot to EOB    Transfers Overall transfer level: Needs assistance Equipment used: Rolling walker (2 wheels) Transfers: Sit to/from Stand Sit to Stand: Min assist, +2 physical assistance, +2 safety/equipment, From elevated surface           General transfer comment: Pt pulling up on RW    Ambulation/Gait Ambulation/Gait assistance: Min guard, +2 safety/equipment Gait Distance (Feet): 25 Feet Assistive device: Rolling walker (2 wheels) Gait Pattern/deviations: Step-through pattern Gait velocity: decreased Gait velocity interpretation: 1.31 - 2.62 ft/sec, indicative of limited community ambulator   General Gait Details: steady gait with RW  Stairs            Wheelchair Mobility    Modified Rankin (Stroke Patients Only)       Balance Overall balance assessment: Needs assistance Sitting-balance support: Bilateral upper extremity supported, Feet supported Sitting balance-Leahy Scale: Fair     Standing balance support: Bilateral upper extremity supported, During functional activity, Reliant on assistive device for balance Standing balance-Leahy Scale: Poor                               Pertinent Vitals/Pain Pain Assessment Pain Assessment: Faces Faces Pain Scale: Hurts a little bit Pain Location: sx  site Pain Descriptors / Indicators: Grimacing, Guarding Pain Intervention(s): Repositioned    Home Living Family/patient expects to be discharged to:: Private residence Living Arrangements: Spouse/significant other;Children (80 yo at home) Available Help at  Discharge: Family;Available PRN/intermittently Type of Home: House Home Access: Stairs to enter Entrance Stairs-Rails: Can reach both (none in front) Entrance Stairs-Number of Steps: 4-5 back, 3 front   Home Layout: One level Home Equipment: Advice worker (2 wheels);Wheelchair - manual;Hand held shower head;Grab bars - tub/shower      Prior Function Prior Level of Function : Needs assist       Physical Assist : Mobility (physical)     Mobility Comments: using RW recently for limited mobility, at times using wheelchair for further distances for the last 2 week but prior to independent ADLs Comments: needing more assist for the last 2 weeks for ADLs, but prior to independent; lawyer     Hand Dominance   Dominant Hand: Right    Extremity/Trunk Assessment   Upper Extremity Assessment Upper Extremity Assessment: Defer to OT evaluation    Lower Extremity Assessment Lower Extremity Assessment: Generalized weakness    Cervical / Trunk Assessment Cervical / Trunk Assessment: Back Surgery  Communication   Communication: No difficulties  Cognition Arousal/Alertness: Awake/alert Behavior During Therapy: WFL for tasks assessed/performed Overall Cognitive Status: Within Functional Limits for tasks assessed                                          General Comments General comments (skin integrity, edema, etc.): BP supine 103/52, sitting 120/54, standing after amb 136/73    Exercises     Assessment/Plan    PT Assessment Patient needs continued PT services  PT Problem List Decreased strength;Decreased mobility;Decreased knowledge of precautions;Decreased activity tolerance;Pain;Decreased balance;Decreased knowledge of use of DME;Obesity       PT Treatment Interventions Therapeutic activities;Therapeutic exercise;Gait training;Patient/family education;DME instruction;Balance training;Functional mobility training;Stair training    PT Goals  (Current goals can be found in the Care Plan section)  Acute Rehab PT Goals Patient Stated Goal: home PT Goal Formulation: With patient Time For Goal Achievement: 09/12/21 Potential to Achieve Goals: Good    Frequency Min 5X/week     Co-evaluation PT/OT/SLP Co-Evaluation/Treatment: Yes Reason for Co-Treatment: For patient/therapist safety;To address functional/ADL transfers;Other (comment) (RN reports low BP this AM) PT goals addressed during session: Mobility/safety with mobility;Balance;Proper use of DME         AM-PAC PT "6 Clicks" Mobility  Outcome Measure Help needed turning from your back to your side while in a flat bed without using bedrails?: Total Help needed moving from lying on your back to sitting on the side of a flat bed without using bedrails?: Total Help needed moving to and from a bed to a chair (including a wheelchair)?: A Little Help needed standing up from a chair using your arms (e.g., wheelchair or bedside chair)?: A Little Help needed to walk in hospital room?: A Little Help needed climbing 3-5 steps with a railing? : A Lot 6 Click Score: 13    End of Session Equipment Utilized During Treatment: Gait belt;Back brace Activity Tolerance: Patient tolerated treatment well Patient left: in chair;with call bell/phone within reach Nurse Communication: Mobility status PT Visit Diagnosis: Other abnormalities of gait and mobility (R26.89);Pain    Time: 1610-9604 PT Time Calculation (min) (ACUTE ONLY): 36 min   Charges:   PT  Evaluation $PT Eval Moderate Complexity: 1 Mod          Lorrin Goodell, [place]  Office # [phone] Pager [phone]   Lorriane Shire 08/29/2021, 11:53 AM

## 2021-08-29 NOTE — TOC Initial Note (Addendum)
Transition of Care (TOC) - Initial/Assessment Note    Patient Details  Name: Terry Horn. MRN: [id number] Date of Birth: [birth year]  Transition of Care [facility]) CM/SW Contact:    Bartholomew Crews, RN Phone Number: [phone] 08/29/2021, 1:33 PM  Clinical Narrative:                  Spoke with patient on hospital room phone to discuss post acute transition. PTA home with spouse. Patient stated that he anticipates transition home on Sunday in order to work with PT more prior to transition home stating that this will make his wife more comfortable. Discussed recommendations for home health PT - patient is agreeable. Referral pending acceptance. TOC following for transition needs.   Update (late entry) - notified yesterday afternoon from liaison at [facility] that they are OON with patient's plan.   Expected Discharge Plan: [place] Barriers to Discharge: Continued Medical Work up   Patient Goals and CMS Choice Patient states their goals for this hospitalization and ongoing recovery are:: return home with wife CMS Medicare.gov Compare Post Acute Care list provided to:: Patient Choice offered to / list presented to : Patient  Expected Discharge Plan and Services Expected Discharge Plan: [place] In-house Referral: NA Discharge Planning Services: CM Consult Post Acute Care Choice: [place] arrangements for the past 2 months: Single Family Home                 DME Arranged: N/A DME Agency: NA       HH Arranged: PT          Prior Living Arrangements/Services Living arrangements for the past 2 months: Single Family Home Lives with:: Self, Spouse Patient language and need for interpreter reviewed:: Yes Do you feel safe going back to the place where you live?: Yes      Need for Family Participation in Patient Care: Yes (Comment) Care giver support system in place?: Yes (comment)   Criminal Activity/Legal Involvement Pertinent  to Current Situation/Hospitalization: No - Comment as needed  Activities of Daily Living Home Assistive Devices/Equipment: Wheelchair ADL Screening (condition at time of admission) Patient's cognitive ability adequate to safely complete daily activities?: Yes Is the patient deaf or have difficulty hearing?: No Does the patient have difficulty seeing, even when wearing glasses/contacts?: No Does the patient have difficulty concentrating, remembering, or making decisions?: Yes Patient able to express need for assistance with ADLs?: No Does the patient have difficulty dressing or bathing?: Yes Independently performs ADLs?: Yes (appropriate for developmental age) Does the patient have difficulty walking or climbing stairs?: Yes Weakness of Legs: None Weakness of Arms/Hands: None  Permission Sought/Granted                  Emotional Assessment Appearance:: Appears stated age Attitude/Demeanor/Rapport: Engaged Affect (typically observed): Accepting Orientation: : Oriented to Self, Oriented to  Time, Oriented to Place, Oriented to Situation Alcohol / Substance Use: Not Applicable Psych Involvement: No (comment)  Admission diagnosis:  Sepsis [facility]) [A41.9] Patient Active Problem List   Diagnosis Date Noted   Severe sepsis ([place]) 08/27/2021   Edema leg 08/27/2021   Coronary artery disease involving native coronary artery of native heart 10/10/2020   Spondylolisthesis, lumbar region 02/14/2020   Chronic knee pain (Bilateral) 09/09/2017   Osteoarthritis of knee (Secondary) (Bilateral) 09/09/2017   Congenital Lumbar central spinal stenosis 09/09/2017   Cervical central spinal stenosis 09/09/2017   Carpal tunnel syndrome (Bilateral) 09/09/2017  Abnormal nerve conduction studies 09/09/2017   Osteoarthritis of spine with radiculopathy, lumbosacral region 09/09/2017   Osteoarthritis of facet joint of lumbar spine 09/09/2017   Osteoarthritis involving multiple joints on both sides of  body 09/09/2017   Cervicalgia 09/09/2017   Chronic neck pain 09/09/2017   Numbness of upper extremity (Bilateral) 09/09/2017   Numbness of lower extremity (Left) 09/09/2017   Spondylosis without myelopathy or radiculopathy, lumbosacral region 09/09/2017   Lumbar facet syndrome (Midline pain pattern) (Bilateral) 09/09/2017   Lumbar facet arthropathy (L3-4, L4-L5, and L5-S1) (Bilateral) 09/09/2017   Low back pain 09/09/2017   Hyperglycemia 02/20/2016   Chronic low back pain (Primary Area of Pain) (Bilateral) w/ sciatica (Left) 02/20/2016   DDD (degenerative disc disease), lumbar 02/20/2016   Lumbar spondylosis 02/20/2016   DDD (degenerative disc disease), cervical 02/20/2016   Cervical spondylolysis 02/20/2016   History of colonic polyps 02/06/2016   Encounter for screening colonoscopy 01/21/2016   Mixed hyperlipidemia 10/01/2015   Chest pressure 07/26/2015   Paresthesia 07/26/2015   Arthritis 01/02/2015   Body mass index 45.0-49.9, adult ([place]) 01/02/2015   Essential (primary) hypertension 01/02/2015   Adiposity 01/02/2015   Apnea, sleep 01/02/2015   Avitaminosis D 01/02/2015   PCP:  Jerrol Banana., MD Pharmacy:   [facility] [id number] - [address], [place] [address] Phone: [phone] Fax: [phone]     Social Determinants of Health (SDOH) Interventions    Readmission Risk Interventions     No data to display

## 2021-08-29 NOTE — Evaluation (Signed)
Occupational Therapy Evaluation Patient Details Name: Terry Horn. MRN: [id number] DOB: [birth year] Today's Date: 08/29/2021   History of Present Illness Pt is a 61 y/o male presenting on 6/27 with progressively worsening low back pain, transferred from [facility] on 6/28. S/P L 3-4 PLIF 6/29.  PMH includes: morbid obesity, OSA on CPAP, HTN, CAD, arthritis.   Clinical Impression   Patient admitted for above and presents with problem list below, including back pain and precautions, decreased activity tolerance and generalized weakness.  He reports requiring increased assist for ADLs and mobility requiring use of WC and RW for the past 2 weeks, but prior to that he was independent.  He currently requires max assist +2 for bed mobility, min assist +2 for transfers and limited mobility, and min to max assist required for ADLs.  He reports having AE from prior back surgery, unable to complete figure 4 technique.  Based on performance today, patient will benefit from further OT services while admitted to optimize independence and safety with ADLs, but anticipate no further needs after dc home.      Recommendations for follow up therapy are one component of a multi-disciplinary discharge planning process, led by the attending physician.  Recommendations may be updated based on patient status, additional functional criteria and insurance authorization.   Follow Up Recommendations  No OT follow up    Assistance Recommended at Discharge Intermittent Supervision/Assistance  Patient can return home with the following A little help with walking and/or transfers;A lot of help with bathing/dressing/bathroom;Assistance with cooking/housework;Assist for transportation;Help with stairs or ramp for entrance    Functional Status Assessment  Patient has had a recent decline in their functional status and demonstrates the ability to make significant improvements in function in a reasonable and predictable amount of  time.  Equipment Recommendations  BSC/3in1 (bariatric)    Recommendations for Other Services       Precautions / Restrictions Precautions Precautions: Fall Precaution Comments: Pt educated on back precautions. Required Braces or Orthoses: Spinal Brace Spinal Brace: Lumbar corset;Applied in sitting position Restrictions Weight Bearing Restrictions: No      Mobility Bed Mobility Overal bed mobility: Needs Assistance Bed Mobility: Rolling, Sidelying to Sit Rolling: Max assist Sidelying to sit: +2 for physical assistance, +2 for safety/equipment, Max assist       General bed mobility comments: +rail, increased time, heavy posterior lean initially, use of bed pad to scoot to EOB    Transfers Overall transfer level: Needs assistance Equipment used: Rolling walker (2 wheels) Transfers: Sit to/from Stand Sit to Stand: Min assist, +2 physical assistance, +2 safety/equipment, From elevated surface           General transfer comment: Pt pulling up on RW      Balance Overall balance assessment: Needs assistance Sitting-balance support: Bilateral upper extremity supported, No upper extremity supported, Feet supported Sitting balance-Leahy Scale: Fair Sitting balance - Comments: initally heavy support of BUEs, progressed to min guard without UE support statically   Standing balance support: Bilateral upper extremity supported, During functional activity, No upper extremity supported Standing balance-Leahy Scale: Poor Standing balance comment: relies on BUE support during mobility but able to complete ADLs without UE support                           ADL either performed or assessed with clinical judgement   ADL Overall ADL's : Needs assistance/impaired     Grooming: Set up;Sitting  Upper Body Dressing : Sitting;Min guard   Lower Body Dressing: Maximal assistance;Sit to/from stand;+2 for physical assistance;+2 for safety/equipment Lower Body  Dressing Details (indicate cue type and reason): unable to don socks or complete figure 4, min assist sit to stand Toilet Transfer: Minimal assistance;+2 for physical assistance;+2 for safety/equipment;Ambulation;Rolling walker (2 wheels)           Functional mobility during ADLs: Minimal assistance;+2 for safety/equipment;+2 for physical assistance;Maximal assistance General ADL Comments: limited by pain, body habitus; educated on precautions, ADL compensatory techniques and DME     Vision   Vision Assessment?: No apparent visual deficits     Perception     Praxis      Pertinent Vitals/Pain Pain Assessment Pain Assessment: Faces Faces Pain Scale: Hurts a little bit Pain Location: sx site Pain Descriptors / Indicators: Grimacing, Guarding Pain Intervention(s): Limited activity within patient's tolerance, Monitored during session, Repositioned     Hand Dominance Right   Extremity/Trunk Assessment Upper Extremity Assessment Upper Extremity Assessment: Overall WFL for tasks assessed   Lower Extremity Assessment Lower Extremity Assessment: Defer to PT evaluation   Cervical / Trunk Assessment Cervical / Trunk Assessment: Back Surgery   Communication Communication Communication: No difficulties   Cognition Arousal/Alertness: Awake/alert Behavior During Therapy: WFL for tasks assessed/performed Overall Cognitive Status: Within Functional Limits for tasks assessed                                       General Comments  BP supine 103/52, sitting 120/54, standing after amb 136/73    Exercises     Shoulder Instructions      Home Living Family/patient expects to be discharged to:: Private residence Living Arrangements: Spouse/significant other;Children (31 y/o son) Available Help at Discharge: Family;Available PRN/intermittently Type of Home: House Home Access: Stairs to enter [employer] of Steps: 4-5 back, 3 front Entrance Stairs-Rails:  Can reach both (none in front) Home Layout: One level     Bathroom Shower/Tub: Walk-in shower;Tub/shower unit   Bathroom Toilet: Standard     Home Equipment: Advice worker (2 wheels);Wheelchair - manual;Hand held shower head;Grab bars - tub/shower;Adaptive equipment Adaptive Equipment: Reacher;Sock aid;Long-handled shoe horn        Prior Functioning/Environment Prior Level of Function : Needs assist       Physical Assist : Mobility (physical)     Mobility Comments: using RW recently for limited mobility, at times using wheelchair for further distances for the last 2 week but prior to independent ADLs Comments: needing more assist for the last 2 weeks for ADLs, but prior to independent; lawyer        OT Problem List: Decreased strength;Decreased activity tolerance;Impaired balance (sitting and/or standing);Decreased knowledge of use of DME or AE;Decreased knowledge of precautions;Pain;Obesity      OT Treatment/Interventions: Self-care/ADL training;DME and/or AE instruction;Therapeutic activities;Patient/family education;Balance training;Energy conservation    OT Goals(Current goals can be found in the care plan section) Acute Rehab OT Goals Patient Stated Goal: get better OT Goal Formulation: With patient Time For Goal Achievement: 09/12/21 Potential to Achieve Goals: Good  OT Frequency: Min 2X/week    Co-evaluation PT/OT/SLP Co-Evaluation/Treatment: Yes Reason for Co-Treatment: For patient/therapist safety;To address functional/ADL transfers PT goals addressed during session: Mobility/safety with mobility;Balance;Proper use of DME OT goals addressed during session: ADL's and self-care      AM-PAC OT "6 Clicks" Daily Activity     Outcome Measure Help  from another person eating meals?: None Help from another person taking care of personal grooming?: A Little Help from another person toileting, which includes using toliet, bedpan, or urinal?: A Lot Help  from another person bathing (including washing, rinsing, drying)?: A Lot Help from another person to put on and taking off regular upper body clothing?: A Little Help from another person to put on and taking off regular lower body clothing?: A Lot 6 Click Score: 16   End of Session Equipment Utilized During Treatment: Gait belt;Rolling walker (2 wheels);Back brace Nurse Communication: Mobility status  Activity Tolerance: Patient tolerated treatment well Patient left: in chair;with call bell/phone within reach;with chair alarm set  OT Visit Diagnosis: Other abnormalities of gait and mobility (R26.89);Muscle weakness (generalized) (M62.81);Pain Pain - part of body:  (back)                Time: 1610-9604 OT Time Calculation (min): 36 min Charges:  OT General Charges $OT Visit: 1 Visit OT Evaluation $OT Eval Moderate Complexity: 1 Mod  Jolaine Artist, OT Acute Rehabilitation Services Office [place] 08/29/2021, 1:01 PM

## 2021-08-30 ENCOUNTER — Other Ambulatory Visit: Payer: Self-pay | Admitting: Family Medicine

## 2021-08-30 DIAGNOSIS — R351 Nocturia: Secondary | ICD-10-CM

## 2021-08-30 DIAGNOSIS — A419 Sepsis, unspecified organism: Secondary | ICD-10-CM | POA: Diagnosis not present

## 2021-08-30 DIAGNOSIS — R6520 Severe sepsis without septic shock: Secondary | ICD-10-CM | POA: Diagnosis not present

## 2021-08-30 LAB — CBC
HCT: 32.2 % — ABNORMAL LOW (ref 39.0–52.0)
Hemoglobin: 10.8 g/dL — ABNORMAL LOW (ref 13.0–17.0)
MCH: 28.3 pg (ref 26.0–34.0)
MCHC: 33.5 g/dL (ref 30.0–36.0)
MCV: 84.5 fL (ref 80.0–100.0)
Platelets: 235 10*3/uL (ref 150–400)
RBC: 3.81 MIL/uL — ABNORMAL LOW (ref 4.22–5.81)
RDW: 13 % (ref 11.5–15.5)
WBC: 11.8 10*3/uL — ABNORMAL HIGH (ref 4.0–10.5)
nRBC: 0 % (ref 0.0–0.2)

## 2021-08-30 LAB — BASIC METABOLIC PANEL
Anion gap: 11 (ref 5–15)
BUN: 21 mg/dL — ABNORMAL HIGH (ref 6–20)
CO2: 25 mmol/L (ref 22–32)
Calcium: 8.2 mg/dL — ABNORMAL LOW (ref 8.9–10.3)
Chloride: 104 mmol/L (ref 98–111)
Creatinine, Ser: 0.7 mg/dL (ref 0.61–1.24)
GFR, Estimated: >60 mL/min (ref >60)
Glucose, Bld: 110 mg/dL — ABNORMAL HIGH (ref 70–99)
Potassium: 3.5 mmol/L (ref 3.5–5.1)
Sodium: 140 mmol/L (ref 135–145)

## 2021-08-30 MED ORDER — ASPIRIN 81 MG PO TABS: 81.0000 mg | ORAL_TABLET | Freq: Every day | ORAL | 2 refills | Status: DC

## 2021-08-30 MED ORDER — OXYCODONE HCL 5 MG PO TABS: 5.0000 mg | ORAL_TABLET | ORAL | 0 refills | Status: DC | PRN

## 2021-08-30 NOTE — Progress Notes (Signed)
Occupational Therapy Treatment Patient Details Name: Terry Horn. MRN: [id number] DOB: 02/22/1961 Today's Date: 08/30/2021   History of present illness Pt is a 61 y/o male presenting on 6/27 with progressively worsening low back pain, transferred from [facility] on 6/28. S/P L 3-4 PLIF 6/29.  PMH includes: morbid obesity, OSA on CPAP, HTN, CAD, arthritis.   OT comments  Pt. Seen for skilled OT treatment session.  Introduction, and pt. Return demo of A/E.  Pt. Did well and is interested in use at home.  Also reviewed other a/e to aide in toileting needs.  Pt. Receptive to education and eager to cont. To progress with his adls more independently.     Recommendations for follow up therapy are one component of a multi-disciplinary discharge planning process, led by the attending physician.  Recommendations may be updated based on patient status, additional functional criteria and insurance authorization.    Follow Up Recommendations  No OT follow up    Assistance Recommended at Discharge Intermittent Supervision/Assistance  Patient can return home with the following  A little help with walking and/or transfers;A lot of help with bathing/dressing/bathroom;Assistance with cooking/housework;Assist for transportation;Help with stairs or ramp for entrance   Equipment Recommendations  BSC/3in1    Recommendations for Other Services      Precautions / Restrictions Precautions Precautions: Fall Precaution Comments: 3/3 Required Braces or Orthoses: Spinal Brace Spinal Brace: Lumbar corset;Applied in sitting position Restrictions Weight Bearing Restrictions: No       Mobility Bed Mobility                    Transfers                         Balance                                           ADL either performed or assessed with clinical judgement   ADL Overall ADL's : Needs assistance/impaired     Grooming: Set up;Sitting;Wash/dry face;Oral  care Grooming Details (indicate cue type and reason): electric shaving also             Lower Body Dressing: Minimal assistance;With adaptive equipment;Cueing for sequencing;Cueing for compensatory techniques;Sitting/lateral leans Lower Body Dressing Details (indicate cue type and reason): don/doff socks with reacher and sock aide.  extensive education and trouble shooting for pericare following BM without twising. wife currently performing this for pt. but he is very determined to complete on his own.  attempted simulated in standing and he can not access without twisting. reviewed tongs and wet wipes, also benefits of planning showers after BM to aide in clean up as well               General ADL Comments: a/e introduction for lb adls. pt. plans to have wife purchase.  also educated toileting modifications also.  tongs, wipes, and shower plans    Extremity/Trunk Assessment              Vision       Perception     Praxis      Cognition Arousal/Alertness: Awake/alert Behavior During Therapy: WFL for tasks assessed/performed Overall Cognitive Status: Within Functional Limits for tasks assessed  Exercises      Shoulder Instructions       General Comments VSS WFL    Pertinent Vitals/ Pain       Pain Assessment Pain Assessment: Faces Faces Pain Scale: Hurts a little bit Pain Location: sx site Pain Descriptors / Indicators: Sore Pain Intervention(s): Limited activity within patient's tolerance, Monitored during session, Repositioned  Home Living Family/patient expects to be discharged to:: Private residence Living Arrangements: Spouse/significant other;Children                                      Prior Functioning/Environment              Frequency  Min 2X/week        Progress Toward Goals  OT Goals(current goals can now be found in the care plan section)  Progress  towards OT goals: Progressing toward goals     Plan      Co-evaluation                 AM-PAC OT "6 Clicks" Daily Activity     Outcome Measure   Help from another person eating meals?: None Help from another person taking care of personal grooming?: A Little Help from another person toileting, which includes using toliet, bedpan, or urinal?: A Lot Help from another person bathing (including washing, rinsing, drying)?: A Lot Help from another person to put on and taking off regular upper body clothing?: A Little Help from another person to put on and taking off regular lower body clothing?: A Lot 6 Click Score: 16    End of Session Equipment Utilized During Treatment: Other (comment) (a/e)  OT Visit Diagnosis: Other abnormalities of gait and mobility (R26.89);Muscle weakness (generalized) (M62.81);Pain   Activity Tolerance Patient tolerated treatment well   Patient Left in chair;with call bell/phone within reach   Nurse Communication          Time: 2111-5520 OT Time Calculation (min): 32 min  Charges: OT General Charges $OT Visit: 1 Visit OT Treatments $Self Care/Home Management : 23-37 mins  Sonia Baller, COTA/L Acute Rehabilitation [phone]   Tanya Nones 08/30/2021, 12:27 PM

## 2021-08-30 NOTE — Progress Notes (Signed)
Subjective: Patient reports that he is doing well overall. He has appropriate surgical pain. No acute events overnight.   Objective: Vital signs in last 24 hours: Temp:  [98 F (36.7 C)-100 F (37.8 C)] 98.3 F (36.8 C) (07/01 1056) Pulse Rate:  [63-88] 88 (07/01 1056) Resp:  [16-20] 16 (07/01 1056) BP: (91-120)/(41-65) 120/55 (07/01 1056) SpO2:  [94 %-100 %] 95 % (07/01 1056) Weight:  [153.8 kg] 153.8 kg (07/01 1035)  Intake/Output from previous day: No intake/output data recorded. Intake/Output this shift: No intake/output data recorded.  Physical Exam: Patient is awake, A/O X 4, conversant, and in good spirits. Eyes open spontaneously. They are in NAD and VSS. Doing well. Speech is fluent and appropriate. MAEW with good strength. Sensation to light touch is intact. PERLA, EOMI. CNs grossly intact. Incision is well approximated with no drainage, erythema, or edema.    Lab Results: Recent Labs    08/29/21 0240 08/30/21 0345  WBC 11.8* 11.8*  HGB 11.9* 10.8*  HCT 37.5* 32.2*  PLT 252 235   BMET Recent Labs    08/29/21 0240 08/30/21 0345  NA 138 140  K 4.9 3.5  CL 105 104  CO2 24 25  GLUCOSE 120* 110*  BUN 14 21*  CREATININE 0.61 0.70  CALCIUM 8.6* 8.2*    Studies/Results: DG Lumbar Spine 2-3 Views  Result Date: 08/28/2021 CLINICAL DATA:  Lumbar fusion intraoperative spot radiographs. EXAM: AP AND LATERAL LUMBOSACRAL SPOT FLUOROSCOPIC VIEWS WERE PERFORMED INTRAOPERATIVELY Fluoro time: 9.2 seconds. Dose: 12.67 mGy. COMPARISON:  AP and lateral lumbar spine 08/15/2021. Lumbar MRI 08/26/2021 FINDINGS: Pre-existing bilateral pedicle screws and interbody metallic hardware are again noted at L4-5. Apparently the fusion rods had been removed at the time of the image. Bilateral pedicle screws have been inserted S1 with P3-I9 interbody metallic hardware also new in the interval. Fusion rods are not visible on either of the two views. Please refer to Dr. Arnoldo Morale' operative  note for further details and recommendations. IMPRESSION: Intraoperative radiographic findings as above. Electronically Signed   By: Telford Nab M.D.   On: 08/28/2021 20:18   DG C-Arm 1-60 Min-No Report  Result Date: 08/28/2021 Fluoroscopy was utilized by the requesting physician.  No radiographic interpretation.   DG C-Arm 1-60 Min-No Report  Result Date: 08/28/2021 Fluoroscopy was utilized by the requesting physician.  No radiographic interpretation.    Assessment/Plan: 61 y.o. male who is POD#2 s/p L3/4 PLIF. He is recovering well and reports a significant reduction of his preoperative symptoms.  His only complaint is of incisional discomfort. He has ambulated with PT who is recommending HHPT. He has worked with OT who is recommending no follow up needed.  Continue working on pain control, mobility and ambulating patient. Will plan for discharge tomorrow.      LOS: 3 days     Marvis Moeller, DNP, AGNP-C Neurosurgery Nurse Practitioner  [facility] [place] [address], [address], [place], [place] [address] P: [phone]    F: [phone]  08/30/2021 12:47 PM

## 2021-08-30 NOTE — Discharge Summary (Signed)
Physician Discharge Summary  Terry Horn. TDS:[id number] DOB: 1961/01/11 DOA: 08/27/2021  PCP: Jerrol Banana., MD  Admit date: 08/27/2021 Discharge date: 08/30/2021  Admitted From: Home Disposition: Home  Recommendations for Outpatient Follow-up:  Follow up with PCP in 1-2 weeks Follow-up with neurosurgery as scheduled  Home Health: PT OT Equipment/Devices: Rolling walker  Discharge Condition: Stable CODE STATUS: Full Diet recommendation: Regular diet as tolerated  Brief/Interim Summary: Terry Shorten. is a 61 y.o. male with PMH significant for morbid obesity, OSA on CPAP, HTN, CAD, GERD, arthritis. Patient was brought to ED from home on 6/27 for progressively worsening low back pain. December 2021, patient underwent, L4-L5 decompression fusion for lumbar radiculopathy history of stage, lumbar facet arthropathy. For the past several weeks, patient has intermittently severe progressively worsening low back pain.  He was seen by Dr. Arnoldo Morale and underwent MRI lumbar spine on 6/27.  It showed progressive degenerative spondylosis at L3-4 with resultant severe canal with right worse than left subarticular stenosis, with moderate right and severe left L3 foraminal narrowing. Prominent reactive marrow edema and enhancement at this level favored to be degenerative in nature. Superimposed infection was less likely but could not rule out. Patient met sepsis criteria at intake - started on antibiotics and hospitalist called for admission.   Patient transferred to main campus for neurosurgical evaluation/procedure.  Neurosurgery evaluation, status post L3-4 decompression/fusion on 08/28/2021, tolerated quite well.  Intraoperative evaluation was unremarkable for any overt infection, as such antibiotics have been discontinued given patient's initial SIRS criteria were likely secondary to uncontrolled pain.  At this time patient is ambulating without any debility, pain currently well  controlled on p.o. oxycodone and his SIRS/sepsis has been ruled out.  Patient's other chronic comorbid conditions as below remained stable otherwise agreeable for discharge home with close follow-up as scheduled.  Discharge Diagnoses:  Principal Problem:   Severe sepsis ([place]) Active Problems:   Cervicalgia   Spondylosis without myelopathy or radiculopathy, lumbosacral region   Lumbar facet arthropathy (L3-4, L4-L5, and L5-S1) (Bilateral)   Apnea, sleep   Body mass index 45.0-49.9, adult (HCC)   Arthritis   Chronic low back pain (Primary Area of Pain) (Bilateral) w/ sciatica (Left)   DDD (degenerative disc disease), lumbar   Cervical spondylolysis  Severe spinal canal stenosis, prior history of L4-L5 decompression - L3/4 decompression/fusion 08/28/21 secondary to profound -lumbar spinal stenosis, lumbago, lumbar radiculopathy   Severe sepsis ruled out-symptoms reactive in the setting of uncontrolled pain  Essential hypertension -continue home medications   Bilateral lower extremity edema -No evidence of DVT. -Echocardiogram unremarkable. -Resolving with ambulation, could be peripheral vascular disease, follow outpatient as indicated   History of CAD -Currently no anginal symptoms Continue statin. -Resume aspirin next week   Morbid obesity  -Body mass index is 47.28 kg/m. Patient has been advised to make an attempt to improve diet and exercise patterns to aid in weight loss.   OSA -Nightly CPAP.  Encouraged to bring his own CPAP machine from home.   BPH -Flomax, finasteride   Constipation -No BM in last 3 days.  Probably due to poor mobility and use of pain medicines. -Start Senokot and as needed laxative.  Discharge Instructions  Discharge Instructions     Discharge patient   Complete by: As directed    Discharge disposition: 06-Home-Health Care Svc   Discharge patient date: 08/30/2021   Face-to-face encounter (required for Medicare/Medicaid patients)   Complete by:  As directed    I  Little Ishikawa certify that this patient is under my care and that I, or a nurse practitioner or physician's assistant working with me, had a face-to-face encounter that meets the physician face-to-face encounter requirements with this patient on 08/30/2021. The encounter with the patient was in whole, or in part for the following medical condition(s) which is the primary reason for home health care (List medical condition): Ambulatory dysfunction   The encounter with the patient was in whole, or in part, for the following medical condition, which is the primary reason for home health care: Ambulatory dysfunction   I certify that, based on my findings, the following services are medically necessary home health services: Physical therapy   Reason for Medically Necessary Home Health Services: Therapy- Personnel officer, Public librarian   My clinical findings support the need for the above services:  Unsafe ambulation due to balance issues Unable to leave home safely without assistance and/or assistive device     Further, I certify that my clinical findings support that this patient is homebound due to: Unable to leave home safely without assistance   Home Health   Complete by: As directed    To provide the following care/treatments: PT      Allergies as of 08/30/2021   No Known Allergies      Medication List     STOP taking these medications    Azelastine-Fluticasone 137-50 MCG/ACT Susp Commonly known as: Dymista   HYDROcodone-acetaminophen 5-325 MG tablet Commonly known as: Norco       TAKE these medications    acetaminophen 325 MG tablet Commonly known as: TYLENOL Take 2 tablets (650 mg total) by mouth every 6 (six) hours as needed for mild pain, moderate pain, fever or headache (or Fever >/= 101).   Artificial Tears 0.1-0.3 % Soln Generic drug: Dextran 70-Hypromellose Place 1 drop into both eyes daily as needed (Dry eye).   aspirin 81  MG tablet Take 1 tablet (81 mg total) by mouth daily. Start taking on: September 19, 2021 What changed: These instructions start on September 19, 2021. If you are unsure what to do until then, ask your doctor or other care provider.   Cholecalciferol 25 MCG (1000 UT) tablet Take 1,000 Units by mouth daily.   cyclobenzaprine 10 MG tablet Commonly known as: FLEXERIL Take 1 tablet (10 mg total) by mouth at bedtime.   diclofenac 50 MG EC tablet Commonly known as: VOLTAREN Take 1 tablet (50 mg total) by mouth 2 (two) times daily.   finasteride 5 MG tablet Commonly known as: PROSCAR Take 1 tablet (5 mg total) by mouth daily.   fish oil-omega-3 fatty acids 1000 MG capsule Take 2,000 tablets by mouth daily.   gabapentin 300 MG capsule Commonly known as: NEURONTIN TAKE ONE CAPSULE BY MOUTH EVERY MORNING AND TAKE TWO CAPSULES BY MOUTH EVERY NIGHT AT BEDTIME What changed:  how much to take how to take this when to take this additional instructions   Glucosamine Chondroitin Complx Tabs Take 2 tablets by mouth daily.   hydrocortisone 2.5 % cream Apply topically 3 (three) times a week. 3 times weekly to scaly areas brows, Tuesday, Thursday, Saturday What changed: how much to take   losartan-hydrochlorothiazide 100-12.5 MG tablet Commonly known as: HYZAAR TAKE ONE TABLET BY MOUTH DAILY   Multiple Vitamin tablet Take 1 tablet by mouth daily.   oxyCODONE 5 MG immediate release tablet Commonly known as: Oxy IR/ROXICODONE Take 1 tablet (5 mg total) by mouth every  3 (three) hours as needed for moderate pain ((score 4 to 6)).   polyethylene glycol 17 g packet Commonly known as: MIRALAX / GLYCOLAX Take 17 g by mouth daily as needed for moderate constipation.   pravastatin 80 MG tablet Commonly known as: PRAVACHOL Take 80 mg by mouth daily.   Probiotic Daily Caps Take 1 tablet by mouth daily.   senna-docusate 8.6-50 MG tablet Commonly known as: Senokot-S Take 1 tablet by mouth at  bedtime.   tamsulosin 0.4 MG Caps capsule Commonly known as: FLOMAX TAKE ONE CAPSULE BY MOUTH DAILY   Turmeric (Curcuma Longa) Powd Take 1 tablet by mouth daily.   vitamin C 500 MG tablet Commonly known as: ASCORBIC ACID Take 500 mg by mouth daily.   vitamin E 180 MG (400 UNITS) capsule Take 400 Units by mouth daily.               Durable Medical Equipment  (From admission, onward)           Start     Ordered   08/30/21 1546  For home use only DME Walker rolling  Once       Comments: Bariatric due to body habitus 5'11" #340  Question Answer Comment  Walker: With 5 Inch Wheels   Patient needs a walker to treat with the following condition Weakness      08/30/21 1546            Follow-up Information     [facility] [place] Follow up.   Specialty: Home Health Services Contact information: [place]  [id number] [phone]                No Known Allergies  Consultations: Neurosurgery  Procedures/Studies: DG Lumbar Spine 2-3 Views  Result Date: 08/28/2021 CLINICAL DATA:  Lumbar fusion intraoperative spot radiographs. EXAM: AP AND LATERAL LUMBOSACRAL SPOT FLUOROSCOPIC VIEWS WERE PERFORMED INTRAOPERATIVELY Fluoro time: 9.2 seconds. Dose: 12.67 mGy. COMPARISON:  AP and lateral lumbar spine 08/15/2021. Lumbar MRI 08/26/2021 FINDINGS: Pre-existing bilateral pedicle screws and interbody metallic hardware are again noted at L4-5. Apparently the fusion rods had been removed at the time of the image. Bilateral pedicle screws have been inserted S1 with I1-V4 interbody metallic hardware also new in the interval. Fusion rods are not visible on either of the two views. Please refer to Dr. Arnoldo Morale' operative note for further details and recommendations. IMPRESSION: Intraoperative radiographic findings as above. Electronically Signed   By: Telford Nab M.D.   On: 08/28/2021 20:18   DG C-Arm 1-60 Min-No Report  Result Date:  08/28/2021 Fluoroscopy was utilized by the requesting physician.  No radiographic interpretation.   DG C-Arm 1-60 Min-No Report  Result Date: 08/28/2021 Fluoroscopy was utilized by the requesting physician.  No radiographic interpretation.   ECHOCARDIOGRAM COMPLETE  Result Date: 08/28/2021    ECHOCARDIOGRAM REPORT   Patient Name:   Terry Mendonca. Date of Exam: 08/27/2021 Medical Rec #:  [id number]            Height:       71.0 in Accession #:    [id number]           Weight:       339.0 lb Date of Birth:  [birth year]            BSA:          2.639 m Patient Age:    36 years  BP:           107/62 mmHg Patient Gender: M                    HR:           78 bpm. Exam Location:  [facility] Procedure: 2D Echo, Cardiac Doppler and Color Doppler Indications:     Abnormal ECG 794.31 / R94.31  History:         Patient has prior history of Echocardiogram examinations, most                  recent 09/24/2015. Risk Factors:Hypertension and Sleep Apnea.                  GERD.  Sonographer:     Sherrie Sport Referring Phys:  [id number] [facility] DAHAL Diagnosing Phys: Isaias Cowman MD  Sonographer Comments: Technically challenging study due to limited acoustic windows and suboptimal apical window. IMPRESSIONS  1. Left ventricular ejection fraction, by estimation, is 60 to 65%. The left ventricle has normal function. The left ventricle has no regional wall motion abnormalities. Left ventricular diastolic parameters were normal.  2. Right ventricular systolic function is normal. The right ventricular size is normal.  3. The mitral valve is normal in structure. Trivial mitral valve regurgitation. No evidence of mitral stenosis.  4. The aortic valve is normal in structure. Aortic valve regurgitation is not visualized. No aortic stenosis is present.  5. The inferior vena cava is normal in size with greater than 50% respiratory variability, suggesting right atrial pressure of 3 mmHg. FINDINGS  Left Ventricle: Left ventricular  ejection fraction, by estimation, is 60 to 65%. The left ventricle has normal function. The left ventricle has no regional wall motion abnormalities. The left ventricular internal cavity size was normal in size. There is  no left ventricular hypertrophy. Left ventricular diastolic parameters were normal. Right Ventricle: The right ventricular size is normal. No increase in right ventricular wall thickness. Right ventricular systolic function is normal. Left Atrium: Left atrial size was normal in size. Right Atrium: Right atrial size was normal in size. Pericardium: There is no evidence of pericardial effusion. Mitral Valve: The mitral valve is normal in structure. Trivial mitral valve regurgitation. No evidence of mitral valve stenosis. Tricuspid Valve: The tricuspid valve is normal in structure. Tricuspid valve regurgitation is trivial. No evidence of tricuspid stenosis. Aortic Valve: The aortic valve is normal in structure. Aortic valve regurgitation is not visualized. No aortic stenosis is present. Aortic valve mean gradient measures 2.0 mmHg. Aortic valve peak gradient measures 2.7 mmHg. Aortic valve area, by VTI measures 2.48 cm. Pulmonic Valve: The pulmonic valve was normal in structure. Pulmonic valve regurgitation is not visualized. No evidence of pulmonic stenosis. Aorta: The aortic root is normal in size and structure. Venous: The inferior vena cava is normal in size with greater than 50% respiratory variability, suggesting right atrial pressure of 3 mmHg. IAS/Shunts: No atrial level shunt detected by color flow Doppler.  LEFT VENTRICLE PLAX 2D LVOT diam:     2.20 cm   Diastology LV SV:         34        LV e' medial:    6.09 cm/s LV SV Index:   13        LV E/e' medial:  14.2 LVOT Area:     3.80 cm  LV e' lateral:   11.30 cm/s  LV E/e' lateral: 7.7  LEFT ATRIUM            Index        RIGHT ATRIUM           Index LA diam:      5.10 cm  1.93 cm/m   RA Area:     21.70 cm LA Vol  (A4C): 107.0 ml 40.55 ml/m  RA Volume:   59.70 ml  22.63 ml/m  AORTIC VALVE AV Area (Vmax):    2.61 cm AV Area (Vmean):   2.23 cm AV Area (VTI):     2.48 cm AV Vmax:           81.80 cm/s AV Vmean:          56.700 cm/s AV VTI:            0.137 m AV Peak Grad:      2.7 mmHg AV Mean Grad:      2.0 mmHg LVOT Vmax:         56.10 cm/s LVOT Vmean:        33.300 cm/s LVOT VTI:          0.089 m LVOT/AV VTI ratio: 0.65  AORTA Ao Root diam: 3.27 cm MITRAL VALVE               TRICUSPID VALVE MV Area (PHT): 4.12 cm    TR Peak grad:   10.1 mmHg MV Decel Time: 184 msec    TR Vmax:        159.00 cm/s MV E velocity: 86.50 cm/s MV A velocity: 82.30 cm/s  SHUNTS MV E/A ratio:  1.05        Systemic VTI:  0.09 m                            Systemic Diam: 2.20 cm Isaias Cowman MD Electronically signed by Isaias Cowman MD Signature Date/Time: 08/28/2021/1:52:04 PM    Final    US Venous Img Lower Bilateral (DVT)  Result Date: 08/27/2021 CLINICAL DATA:  61 year old male with bilateral lower extremity edema. EXAM: BILATERAL LOWER EXTREMITY VENOUS DOPPLER ULTRASOUND TECHNIQUE: Gray-scale sonography with compression, as well as color and duplex ultrasound, were performed to evaluate the deep venous system(s) from the level of the common femoral vein through the popliteal and proximal calf veins. COMPARISON:  None Available. FINDINGS: VENOUS Normal compressibility of the bilateral common femoral, superficial femoral, and popliteal veins, as well as the visualized calf veins. Visualized portions of bilateral profunda femoral vein and great saphenous vein unremarkable. No filling defects to suggest DVT on grayscale or color Doppler imaging. Doppler waveforms show normal direction of venous flow, normal respiratory plasticity and response to augmentation. OTHER None. Limitations: none IMPRESSION: No evidence of bilateral lower extremity deep venous thrombosis. Electronically Signed   By: Genevie Ann M.D.   On: 08/27/2021 04:40    MR Lumbar Spine W Wo Contrast  Result Date: 08/27/2021 CLINICAL DATA:  Initial evaluation for progressively worsening lower back pain, right greater than left hip and buttock pain. EXAM: MRI LUMBAR SPINE WITHOUT AND WITH CONTRAST TECHNIQUE: Multiplanar and multiecho pulse sequences of the lumbar spine were obtained without and with intravenous contrast. CONTRAST:  93m GADAVIST GADOBUTROL 1 MMOL/ML IV SOLN COMPARISON:  Prior MRI from 12/28/2018. FINDINGS: Segmentation: Standard. Lowest well-formed disc space labeled the L5-S1 level. Alignment: Up to 4 mm retrolisthesis of L3 on L4, progressed from previous. Alignment otherwise normal  with preservation of the normal lumbar lordosis. Vertebrae: Postoperative changes from prior PLIF at L4-5. Vertebral body height maintained without acute or chronic fracture. Bone marrow signal intensity within normal limits. No discrete or worrisome osseous lesions. Prominent reactive marrow edema and enhancement present about the L3-4 interspace, favored to be degenerative in nature. Superimposed infection somewhat difficult to exclude, but felt to be unlikely. No other abnormal marrow edema or enhancement. Conus medullaris and cauda equina: Conus extends to the L1-2 level. Conus medullaris within normal limits. Nerve roots of the cauda equina mildly irregular proximal to the L3-4 level due to stenosis. Paraspinal and other soft tissues: Postoperative changes noted within the posterior paraspinous soft tissues. Paraspinous soft tissues demonstrate no other acute finding. Disc levels: L1-2: Negative interspace. Minimal facet spurring. No canal or foraminal stenosis. L2-3: Negative interspace. Mild facet hypertrophy. No significant canal or foraminal stenosis. L3-4: Progressive degenerative intervertebral disc space narrowing and 4 mm retrolisthesis. Associated diffuse disc bulge with reactive endplate spurring. Superimposed right subarticular disc protrusion with inferior  migration (series 8, images 26, 27). Moderate facet and ligament flavum hypertrophy with associated small joint effusions. Resultant severe canal with severe right worse than left subarticular stenosis. Moderate right with severe left L3 foraminal narrowing. L4-5: Prior PLIF. No residual spinal stenosis. Residual mild to moderate left with mild right foraminal narrowing. L5-S1: Negative interspace. Moderate right with mild left facet hypertrophy. No spinal stenosis. Foramina remain patent. IMPRESSION: 1. Progressive degenerative spondylosis at L3-4 with resultant severe canal with right worse than left subarticular stenosis, with moderate right and severe left L3 foraminal narrowing. Prominent reactive marrow edema and enhancement at this level favored to be degenerative in nature. Superimposed infection somewhat difficult to exclude, but felt to be unlikely. Correlation with any potential symptomatology and laboratory values recommended as warranted. 2. Prior PLIF at L4-5 without residual spinal stenosis. Electronically Signed   By: Jeannine Boga M.D.   On: 08/27/2021 00:56   DG Chest Port 1 View  Result Date: 08/26/2021 CLINICAL DATA:  Sepsis EXAM: PORTABLE CHEST 1 VIEW COMPARISON:  None Available. FINDINGS: The heart size and mediastinal contours are within normal limits. Both lungs are clear. The visualized skeletal structures are unremarkable. IMPRESSION: No active disease. Electronically Signed   By: Fidela Salisbury M.D.   On: 08/26/2021 23:46     Subjective: No acute issues or events overnight   Discharge Exam: Vitals:   08/30/21 0737 08/30/21 1056  BP: 120/65 (!) 120/55  Pulse: 82 88  Resp: 20 16  Temp: 99.8 F (37.7 C) 98.3 F (36.8 C)  SpO2: 94% 95%   Vitals:   08/30/21 0352 08/30/21 0737 08/30/21 1035 08/30/21 1056  BP: 113/61 120/65  (!) 120/55  Pulse: 77 82  88  Resp: '20 20  16  ' Temp: 98.9 F (37.2 C) 99.8 F (37.7 C)  98.3 F (36.8 C)  TempSrc: Oral Oral    SpO2:  94% 94%  95%  Weight:   (!) 153.8 kg   Height:   '5\' 11"'  (1.803 m)     General: Pt is alert, awake, not in acute distress Cardiovascular: RRR, S1/S2 +, no rubs, no gallops Respiratory: CTA bilaterally, no wheezing, no rhonchi Abdominal: Soft, NT, ND, bowel sounds + Extremities: no edema, no cyanosis    The results of significant diagnostics from this hospitalization (including imaging, microbiology, ancillary and laboratory) are listed below for reference.     Microbiology: Recent Results (from the past 240 hour(s))  Blood Culture (routine  x 2)     Status: Abnormal   Collection Time: 08/26/21 11:31 PM   Specimen: BLOOD  Result Value Ref Range Status   Specimen Description   Final    BLOOD RIGHT ASSIST CONTROL Performed at [facility], [address], [place], [place] [address]    Special Requests   Final    BOTTLES DRAWN AEROBIC AND ANAEROBIC Blood Culture adequate volume Performed at [facility], [address] [place], [place] [address]    Culture  Setup Time   Final    Organism ID to follow [place] CRITICAL RESULT CALLED TO, READ BACK BY AND VERIFIED WITH: NATHAN BELUE AT 0100 ON 08/28/21 BY SS GRAM STAIN REVIEWED-AGREE WITH RESULT    Culture (A)  Final    STAPHYLOCOCCUS CAPITIS THE SIGNIFICANCE OF ISOLATING THIS ORGANISM FROM A SINGLE SET OF BLOOD CULTURES WHEN MULTIPLE SETS ARE DRAWN IS UNCERTAIN. PLEASE NOTIFY THE MICROBIOLOGY DEPARTMENT WITHIN ONE WEEK IF SPECIATION AND SENSITIVITIES ARE REQUIRED. Performed at [facility], [place] [address], [place], [place] [address]    Report Status 08/30/2021 FINAL  Final  Blood Culture (routine x 2)     Status: None (Preliminary result)   Collection Time: 08/26/21 11:31 PM   Specimen: BLOOD  Result Value Ref Range Status   Specimen Description BLOOD LEFT ASSIST CONTROL  Final   Special Requests   Final    BOTTLES DRAWN AEROBIC AND ANAEROBIC Blood Culture results  may not be optimal due to an inadequate volume of blood received in culture bottles   Culture  Setup Time   Final    GRAM POSITIVE COCCI IN BOTH AEROBIC AND ANAEROBIC BOTTLES CRITICAL VALUE NOTED.  VALUE IS CONSISTENT WITH PREVIOUSLY REPORTED AND CALLED VALUE. Performed at [facility], [address] [place], [place] [address]    Culture [facility] POSITIVE COCCI  Final   Report Status PENDING  Incomplete  Blood Culture ID Panel (Reflexed)     Status: Abnormal   Collection Time: 08/26/21 11:31 PM  Result Value Ref Range Status   Enterococcus faecalis NOT DETECTED NOT DETECTED Final   Enterococcus Faecium NOT DETECTED NOT DETECTED Final   Listeria monocytogenes NOT DETECTED NOT DETECTED Final   Staphylococcus species DETECTED (A) NOT DETECTED Final    Comment: CRITICAL RESULT CALLED TO, READ BACK BY AND VERIFIED WITH: NATHAN BELUE AT 0100 ON 08/28/21 BY SS    Staphylococcus aureus (BCID) NOT DETECTED NOT DETECTED Final   Staphylococcus epidermidis NOT DETECTED NOT DETECTED Final   Staphylococcus lugdunensis NOT DETECTED NOT DETECTED Final   Streptococcus species NOT DETECTED NOT DETECTED Final   Streptococcus agalactiae NOT DETECTED NOT DETECTED Final   Streptococcus pneumoniae NOT DETECTED NOT DETECTED Final   Streptococcus pyogenes NOT DETECTED NOT DETECTED Final   A.calcoaceticus-baumannii NOT DETECTED NOT DETECTED Final   Bacteroides fragilis NOT DETECTED NOT DETECTED Final   Enterobacterales NOT DETECTED NOT DETECTED Final   Enterobacter cloacae complex NOT DETECTED NOT DETECTED Final   Escherichia coli NOT DETECTED NOT DETECTED Final   Klebsiella aerogenes NOT DETECTED NOT DETECTED Final   Klebsiella oxytoca NOT DETECTED NOT DETECTED Final   Klebsiella pneumoniae NOT DETECTED NOT DETECTED Final   Proteus species NOT DETECTED NOT DETECTED Final   Salmonella species NOT DETECTED NOT DETECTED Final   Serratia marcescens NOT DETECTED NOT DETECTED Final   Haemophilus  influenzae NOT DETECTED NOT DETECTED Final   Neisseria meningitidis NOT DETECTED NOT DETECTED Final  Pseudomonas aeruginosa NOT DETECTED NOT DETECTED Final   Stenotrophomonas maltophilia NOT DETECTED NOT DETECTED Final   Candida albicans NOT DETECTED NOT DETECTED Final   Candida auris NOT DETECTED NOT DETECTED Final   Candida glabrata NOT DETECTED NOT DETECTED Final   Candida krusei NOT DETECTED NOT DETECTED Final   Candida parapsilosis NOT DETECTED NOT DETECTED Final   Candida tropicalis NOT DETECTED NOT DETECTED Final   Cryptococcus neoformans/gattii NOT DETECTED NOT DETECTED Final    Comment: Performed at [facility], [address], [place], [place] [address]  Surgical PCR screen     Status: None   Collection Time: 08/27/21  2:12 PM   Specimen: Nasal Mucosa; Nasal Swab  Result Value Ref Range Status   MRSA, PCR NEGATIVE NEGATIVE Final   Staphylococcus aureus NEGATIVE NEGATIVE Final    Comment: (NOTE) The Xpert SA Assay (FDA approved for NASAL specimens in patients 57 years of age and older), is one component of a comprehensive surveillance program. It is not intended to diagnose infection nor to guide or monitor treatment. Performed at [facility], [address] [place], [place] [address]   Aerobic/Anaerobic Culture w Gram Stain (surgical/deep wound)     Status: None (Preliminary result)   Collection Time: 08/28/21  6:27 PM   Specimen: Wound; Tissue  Result Value Ref Range Status   Specimen Description WOUND  Final   Special Requests LUMBAR 3,4 DISC SPACE  Final   Gram Stain   Final    NO SQUAMOUS EPITHELIAL CELLS SEEN FEW WBC SEEN NO ORGANISMS SEEN    Culture   Final    NO GROWTH 2 DAYS Performed at [facility], [address] [place],  [address]    Report Status PENDING  Incomplete     Labs: BNP (last 3 results) No results for input(s): "BNP" in the last 8760 hours. Basic Metabolic Panel: Recent Labs  Lab 08/26/21 2212  08/27/21 0513 08/28/21 0434 08/29/21 0240 08/30/21 0345  NA 140 139 139 138 140  K 4.2 3.5 3.4* 4.9 3.5  CL 100 103 103 105 104  CO2 '29 27 24 24 25  ' GLUCOSE 100* 99 108* 120* 110*  BUN 27* 22* 12 14 21*  CREATININE 0.75 0.59* 0.62 0.61 0.70  CALCIUM 9.2 8.7* 8.7* 8.6* 8.2*   Liver Function Tests: Recent Labs  Lab 08/26/21 2212 08/27/21 0513  AST 18 14*  ALT 16 13  ALKPHOS 66 55  BILITOT 0.5 0.5  PROT 7.3 6.4*  ALBUMIN 3.7 3.3*   Recent Labs  Lab 08/26/21 2212  LIPASE 35   No results for input(s): "AMMONIA" in the last 168 hours. CBC: Recent Labs  Lab 08/26/21 2212 08/27/21 0513 08/28/21 0434 08/29/21 0240 08/30/21 0345  WBC 10.2 9.0 8.6 11.8* 11.8*  NEUTROABS 7.2  --   --   --   --   HGB 13.3 11.7* 11.8* 11.9* 10.8*  HCT 42.6 36.9* 36.1* 37.5* 32.2*  MCV 88.8 87.9 84.9 86.8 84.5  PLT 301 255 178 252 235   Cardiac Enzymes: No results for input(s): "CKTOTAL", "CKMB", "CKMBINDEX", "TROPONINI" in the last 168 hours. BNP: Invalid input(s): "POCBNP" CBG: No results for input(s): "GLUCAP" in the last 168 hours. D-Dimer No results for input(s): "DDIMER" in the last 72 hours. Hgb A1c No results for input(s): "HGBA1C" in the last 72 hours. Lipid Profile No results for input(s): "CHOL", "HDL", "LDLCALC", "TRIG", "CHOLHDL", "LDLDIRECT" in the last 72 hours. Thyroid function studies No results for  input(s): "TSH", "T4TOTAL", "T3FREE", "THYROIDAB" in the last 72 hours.  Invalid input(s): "FREET3" Anemia work up No results for input(s): "VITAMINB12", "FOLATE", "FERRITIN", "TIBC", "IRON", "RETICCTPCT" in the last 72 hours. Urinalysis    Component Value Date/Time   COLORURINE YELLOW (A) 08/26/2021 2137   APPEARANCEUR HAZY (A) 08/26/2021 2137   LABSPEC 1.026 08/26/2021 2137   PHURINE 6.0 08/26/2021 2137   GLUCOSEU NEGATIVE 08/26/2021 2137   HGBUR MODERATE (A) 08/26/2021 2137   BILIRUBINUR NEGATIVE 08/26/2021 2137   BILIRUBINUR neg 08/11/2021 1031   KETONESUR  5 (A) 08/26/2021 2137   PROTEINUR NEGATIVE 08/26/2021 2137   UROBILINOGEN 2.0 (A) 08/11/2021 1031   NITRITE NEGATIVE 08/26/2021 2137   LEUKOCYTESUR NEGATIVE 08/26/2021 2137   Sepsis Labs Recent Labs  Lab 08/27/21 0513 08/28/21 0434 08/29/21 0240 08/30/21 0345  WBC 9.0 8.6 11.8* 11.8*   Microbiology Recent Results (from the past 240 hour(s))  Blood Culture (routine x 2)     Status: Abnormal   Collection Time: 08/26/21 11:31 PM   Specimen: BLOOD  Result Value Ref Range Status   Specimen Description   Final    BLOOD RIGHT ASSIST CONTROL Performed at [facility], [address] [place], [place] [address]    Special Requests   Final    BOTTLES DRAWN AEROBIC AND ANAEROBIC Blood Culture adequate volume Performed at [facility], [address] [place], [place] [address]    Culture  Setup Time   Final    Organism ID to follow [place] CRITICAL RESULT CALLED TO, READ BACK BY AND VERIFIED WITH: NATHAN BELUE AT 0100 ON 08/28/21 BY SS GRAM STAIN REVIEWED-AGREE WITH RESULT    Culture (A)  Final    STAPHYLOCOCCUS CAPITIS THE SIGNIFICANCE OF ISOLATING THIS ORGANISM FROM A SINGLE SET OF BLOOD CULTURES WHEN MULTIPLE SETS ARE DRAWN IS UNCERTAIN. PLEASE NOTIFY THE MICROBIOLOGY DEPARTMENT WITHIN ONE WEEK IF SPECIATION AND SENSITIVITIES ARE REQUIRED. Performed at [facility], [place] [address] [place], [place] [address]    Report Status 08/30/2021 FINAL  Final  Blood Culture (routine x 2)     Status: None (Preliminary result)   Collection Time: 08/26/21 11:31 PM   Specimen: BLOOD  Result Value Ref Range Status   Specimen Description BLOOD LEFT ASSIST CONTROL  Final   Special Requests   Final    BOTTLES DRAWN AEROBIC AND ANAEROBIC Blood Culture results may not be optimal due to an inadequate volume of blood received in culture bottles   Culture  Setup Time   Final    GRAM POSITIVE COCCI IN BOTH AEROBIC AND ANAEROBIC  BOTTLES CRITICAL VALUE NOTED.  VALUE IS CONSISTENT WITH PREVIOUSLY REPORTED AND CALLED VALUE. Performed at [facility], [address] [place],  [address]    Culture [facility] POSITIVE COCCI  Final   Report Status PENDING  Incomplete  Blood Culture ID Panel (Reflexed)     Status: Abnormal   Collection Time: 08/26/21 11:31 PM  Result Value Ref Range Status   Enterococcus faecalis NOT DETECTED NOT DETECTED Final   Enterococcus Faecium NOT DETECTED NOT DETECTED Final   Listeria monocytogenes NOT DETECTED NOT DETECTED Final   Staphylococcus species DETECTED (A) NOT DETECTED Final    Comment: CRITICAL RESULT CALLED TO, READ BACK BY AND VERIFIED WITH: NATHAN BELUE AT 0100 ON 08/28/21 BY SS    Staphylococcus aureus (BCID) NOT DETECTED NOT DETECTED Final   Staphylococcus epidermidis NOT DETECTED NOT DETECTED Final   Staphylococcus lugdunensis  NOT DETECTED NOT DETECTED Final   Streptococcus species NOT DETECTED NOT DETECTED Final   Streptococcus agalactiae NOT DETECTED NOT DETECTED Final   Streptococcus pneumoniae NOT DETECTED NOT DETECTED Final   Streptococcus pyogenes NOT DETECTED NOT DETECTED Final   A.calcoaceticus-baumannii NOT DETECTED NOT DETECTED Final   Bacteroides fragilis NOT DETECTED NOT DETECTED Final   Enterobacterales NOT DETECTED NOT DETECTED Final   Enterobacter cloacae complex NOT DETECTED NOT DETECTED Final   Escherichia coli NOT DETECTED NOT DETECTED Final   Klebsiella aerogenes NOT DETECTED NOT DETECTED Final   Klebsiella oxytoca NOT DETECTED NOT DETECTED Final   Klebsiella pneumoniae NOT DETECTED NOT DETECTED Final   Proteus species NOT DETECTED NOT DETECTED Final   Salmonella species NOT DETECTED NOT DETECTED Final   Serratia marcescens NOT DETECTED NOT DETECTED Final   Haemophilus influenzae NOT DETECTED NOT DETECTED Final   Neisseria meningitidis NOT DETECTED NOT DETECTED Final   Pseudomonas aeruginosa NOT DETECTED NOT DETECTED Final    Stenotrophomonas maltophilia NOT DETECTED NOT DETECTED Final   Candida albicans NOT DETECTED NOT DETECTED Final   Candida auris NOT DETECTED NOT DETECTED Final   Candida glabrata NOT DETECTED NOT DETECTED Final   Candida krusei NOT DETECTED NOT DETECTED Final   Candida parapsilosis NOT DETECTED NOT DETECTED Final   Candida tropicalis NOT DETECTED NOT DETECTED Final   Cryptococcus neoformans/gattii NOT DETECTED NOT DETECTED Final    Comment: Performed at [facility], [address] [place], [place] [address]  Surgical PCR screen     Status: None   Collection Time: 08/27/21  2:12 PM   Specimen: Nasal Mucosa; Nasal Swab  Result Value Ref Range Status   MRSA, PCR NEGATIVE NEGATIVE Final   Staphylococcus aureus NEGATIVE NEGATIVE Final    Comment: (NOTE) The Xpert SA Assay (FDA approved for NASAL specimens in patients 46 years of age and older), is one component of a comprehensive surveillance program. It is not intended to diagnose infection nor to guide or monitor treatment. Performed at [facility], [address] [place], [place] [address]   Aerobic/Anaerobic Culture w Gram Stain (surgical/deep wound)     Status: None (Preliminary result)   Collection Time: 08/28/21  6:27 PM   Specimen: Wound; Tissue  Result Value Ref Range Status   Specimen Description WOUND  Final   Special Requests LUMBAR 3,4 DISC SPACE  Final   Gram Stain   Final    NO SQUAMOUS EPITHELIAL CELLS SEEN FEW WBC SEEN NO ORGANISMS SEEN    Culture   Final    NO GROWTH 2 DAYS Performed at [facility], [address] [place], [place] [address]    Report Status PENDING  Incomplete     Time coordinating discharge: Over 30 minutes  SIGNED:   Little Ishikawa, DO [facility] 08/30/2021, 5:23 PM Pager   If 7PM-7AM, please contact night-coverage [email or web address]

## 2021-08-30 NOTE — Care Management (Addendum)
Attempting to set up with Home Health services. [facility]- declined [facility]- declined [facility]- declined [facility]- pending [facility]- pending [facility]- responded that they could accept.  Per Dr Avon Gully family bought toilet, order placed for bari RW and will be delivered to the room through [facility] prior to [place] w patient and wife at bedside to explain [facility] set up and DME ordered, they were appreciative. No other needs for discharge to home identified.

## 2021-08-30 NOTE — Final Progress Note (Signed)
Patient discharging home with wife. Vital signs stable at time of discharge as reflected in discharge summary. Discharge instructions given and verbal understanding returned. No questions at this time.

## 2021-08-30 NOTE — Progress Notes (Signed)
Physical Therapy Treatment Patient Details Name: Terry Horn. MRN: [id number] DOB: Aug 12, 1960 Today's Date: 08/30/2021   History of Present Illness Pt is a 61 y/o male presenting on 6/27 with progressively worsening low back pain, transferred from [facility] on 6/28. S/P L 3-4 PLIF 6/29.  PMH includes: morbid obesity, OSA on CPAP, HTN, CAD, arthritis.    PT Comments    Pt progressing well towards all goals. Pt much improved ambulation ability and duration. Pt continues to have most difficulty with bed mobility more so due to body habitus. Pt making good progress and suspect he will be able to d/c home with spouse with use of bariatric walker once medically stable. Acute PT to cont to follow.    Recommendations for follow up therapy are one component of a multi-disciplinary discharge planning process, led by the attending physician.  Recommendations may be updated based on patient status, additional functional criteria and insurance authorization.  Follow Up Recommendations  Home health PT     Assistance Recommended at Discharge PRN  Patient can return home with the following A little help with walking and/or transfers;Help with stairs or ramp for entrance;Assist for transportation;Assistance with cooking/housework;A lot of help with bathing/dressing/bathroom   Equipment Recommendations  Rolling walker (2 wheels) (barriatric rolling walker)    Recommendations for Other Services       Precautions / Restrictions Precautions Precautions: Fall Precaution Comments: pt able to recall 2/3 precautions and reports "It's really hard not to twist" Required Braces or Orthoses: Spinal Brace Spinal Brace: Lumbar corset;Applied in sitting position Restrictions Weight Bearing Restrictions: No     Mobility  Bed Mobility Overal bed mobility: Needs Assistance Bed Mobility: Rolling, Sidelying to Sit Rolling: Max assist Sidelying to sit: Max assist       General bed mobility comments: max  directional verbal cues, HOB elevated, labored effort due to body habitus    Transfers Overall transfer level: Needs assistance Equipment used: Rolling walker (2 wheels) Transfers: Sit to/from Stand Sit to Stand: Min assist, From elevated surface           General transfer comment: verbal cues for safe hand placement, bed elevated due to patients heights and adherence to back precautions    Ambulation/Gait Ambulation/Gait assistance: Min assist Gait Distance (Feet): 120 Feet (x1, 25x1 (to/from bathroom)) Assistive device: Rolling walker (2 wheels) Gait Pattern/deviations: Step-through pattern Gait velocity: decreased Gait velocity interpretation: <1.31 ft/sec, indicative of household ambulator   General Gait Details: steady gait with RW   Stairs             Wheelchair Mobility    Modified Rankin (Stroke Patients Only)       Balance Overall balance assessment: Needs assistance Sitting-balance support: Bilateral upper extremity supported, No upper extremity supported, Feet supported Sitting balance-Leahy Scale: Fair Sitting balance - Comments: initally heavy support of BUEs, progressed to min guard without UE support statically   Standing balance support: Bilateral upper extremity supported, During functional activity, No upper extremity supported Standing balance-Leahy Scale: Fair Standing balance comment: reliant on L UE support while standing and urinating                            Cognition Arousal/Alertness: Awake/alert Behavior During Therapy: WFL for tasks assessed/performed Overall Cognitive Status: Within Functional Limits for tasks assessed  Exercises      General Comments General comments (skin integrity, edema, etc.): VSS WFL      Pertinent Vitals/Pain Pain Assessment Pain Assessment: 0-10 Pain Score: 3  Pain Location: sx site Pain Descriptors / Indicators: Sore Pain  Intervention(s): Premedicated before session    Home Living Family/patient expects to be discharged to:: Private residence Living Arrangements: Spouse/significant other;Children                      Prior Function            PT Goals (current goals can now be found in the care plan section) Acute Rehab PT Goals Patient Stated Goal: home PT Goal Formulation: With patient Time For Goal Achievement: 09/12/21 Potential to Achieve Goals: Good Progress towards PT goals: Progressing toward goals    Frequency    Min 5X/week      PT Plan Current plan remains appropriate    Co-evaluation              AM-PAC PT "6 Clicks" Mobility   Outcome Measure  Help needed turning from your back to your side while in a flat bed without using bedrails?: A Lot Help needed moving from lying on your back to sitting on the side of a flat bed without using bedrails?: A Lot Help needed moving to and from a bed to a chair (including a wheelchair)?: A Little Help needed standing up from a chair using your arms (e.g., wheelchair or bedside chair)?: A Little Help needed to walk in hospital room?: A Little Help needed climbing 3-5 steps with a railing? : A Lot 6 Click Score: 15    End of Session Equipment Utilized During Treatment: Gait belt;Back brace Activity Tolerance: Patient tolerated treatment well Patient left: in chair;with call bell/phone within reach;with chair alarm set Nurse Communication: Mobility status PT Visit Diagnosis: Other abnormalities of gait and mobility (R26.89);Pain     Time: 8299-3716 PT Time Calculation (min) (ACUTE ONLY): 34 min  Charges:  $Gait Training: 8-22 mins $Therapeutic Activity: 8-22 mins                     Kittie Plater, PT, DPT Acute Rehabilitation Services Secure chat preferred Office #: [phone]    Berline Lopes 08/30/2021, 10:53 AM

## 2021-09-01 ENCOUNTER — Telehealth: Payer: Self-pay | Admitting: Family Medicine

## 2021-09-01 DIAGNOSIS — Z4789 Encounter for other orthopedic aftercare: Secondary | ICD-10-CM | POA: Diagnosis not present

## 2021-09-01 DIAGNOSIS — M5442 Lumbago with sciatica, left side: Secondary | ICD-10-CM | POA: Diagnosis not present

## 2021-09-01 DIAGNOSIS — N400 Enlarged prostate without lower urinary tract symptoms: Secondary | ICD-10-CM | POA: Diagnosis not present

## 2021-09-01 DIAGNOSIS — I2510 Atherosclerotic heart disease of native coronary artery without angina pectoris: Secondary | ICD-10-CM | POA: Diagnosis not present

## 2021-09-01 DIAGNOSIS — M19029 Primary osteoarthritis, unspecified elbow: Secondary | ICD-10-CM | POA: Diagnosis not present

## 2021-09-01 DIAGNOSIS — M19042 Primary osteoarthritis, left hand: Secondary | ICD-10-CM | POA: Diagnosis not present

## 2021-09-01 DIAGNOSIS — G4733 Obstructive sleep apnea (adult) (pediatric): Secondary | ICD-10-CM | POA: Diagnosis not present

## 2021-09-01 DIAGNOSIS — G8929 Other chronic pain: Secondary | ICD-10-CM | POA: Diagnosis not present

## 2021-09-01 DIAGNOSIS — M4726 Other spondylosis with radiculopathy, lumbar region: Secondary | ICD-10-CM | POA: Diagnosis not present

## 2021-09-01 DIAGNOSIS — M5116 Intervertebral disc disorders with radiculopathy, lumbar region: Secondary | ICD-10-CM | POA: Diagnosis not present

## 2021-09-01 DIAGNOSIS — M47817 Spondylosis without myelopathy or radiculopathy, lumbosacral region: Secondary | ICD-10-CM | POA: Diagnosis not present

## 2021-09-01 DIAGNOSIS — M47812 Spondylosis without myelopathy or radiculopathy, cervical region: Secondary | ICD-10-CM | POA: Diagnosis not present

## 2021-09-01 DIAGNOSIS — M48061 Spinal stenosis, lumbar region without neurogenic claudication: Secondary | ICD-10-CM | POA: Diagnosis not present

## 2021-09-01 DIAGNOSIS — K219 Gastro-esophageal reflux disease without esophagitis: Secondary | ICD-10-CM | POA: Diagnosis not present

## 2021-09-01 DIAGNOSIS — I10 Essential (primary) hypertension: Secondary | ICD-10-CM | POA: Diagnosis not present

## 2021-09-01 DIAGNOSIS — N401 Enlarged prostate with lower urinary tract symptoms: Secondary | ICD-10-CM

## 2021-09-01 DIAGNOSIS — M19041 Primary osteoarthritis, right hand: Secondary | ICD-10-CM | POA: Diagnosis not present

## 2021-09-01 MED ORDER — TAMSULOSIN HCL 0.4 MG PO CAPS: 0.4000 mg | ORAL_CAPSULE | Freq: Every day | ORAL | 1 refills | Status: DC

## 2021-09-01 NOTE — Addendum Note (Signed)
Addended by: Doristine Devoid on: 09/01/2021 02:31 PM   Modules accepted: Orders

## 2021-09-01 NOTE — Telephone Encounter (Signed)
refilled 

## 2021-09-01 NOTE — Telephone Encounter (Signed)
Patient needs refills on Tamsulosin sent to [facility]. [place], [place]

## 2021-09-02 LAB — AEROBIC/ANAEROBIC CULTURE W GRAM STAIN (SURGICAL/DEEP WOUND): Gram Stain: NONE SEEN

## 2021-09-02 LAB — CULTURE, BLOOD (ROUTINE X 2): Special Requests: ADEQUATE

## 2021-09-04 DIAGNOSIS — M5442 Lumbago with sciatica, left side: Secondary | ICD-10-CM | POA: Diagnosis not present

## 2021-09-04 DIAGNOSIS — K219 Gastro-esophageal reflux disease without esophagitis: Secondary | ICD-10-CM | POA: Diagnosis not present

## 2021-09-04 DIAGNOSIS — M19042 Primary osteoarthritis, left hand: Secondary | ICD-10-CM | POA: Diagnosis not present

## 2021-09-04 DIAGNOSIS — M19041 Primary osteoarthritis, right hand: Secondary | ICD-10-CM | POA: Diagnosis not present

## 2021-09-04 DIAGNOSIS — M47812 Spondylosis without myelopathy or radiculopathy, cervical region: Secondary | ICD-10-CM | POA: Diagnosis not present

## 2021-09-04 DIAGNOSIS — N400 Enlarged prostate without lower urinary tract symptoms: Secondary | ICD-10-CM | POA: Diagnosis not present

## 2021-09-04 DIAGNOSIS — M5116 Intervertebral disc disorders with radiculopathy, lumbar region: Secondary | ICD-10-CM | POA: Diagnosis not present

## 2021-09-04 DIAGNOSIS — M47817 Spondylosis without myelopathy or radiculopathy, lumbosacral region: Secondary | ICD-10-CM | POA: Diagnosis not present

## 2021-09-04 DIAGNOSIS — M48061 Spinal stenosis, lumbar region without neurogenic claudication: Secondary | ICD-10-CM | POA: Diagnosis not present

## 2021-09-04 DIAGNOSIS — Z4789 Encounter for other orthopedic aftercare: Secondary | ICD-10-CM | POA: Diagnosis not present

## 2021-09-04 DIAGNOSIS — M4726 Other spondylosis with radiculopathy, lumbar region: Secondary | ICD-10-CM | POA: Diagnosis not present

## 2021-09-04 DIAGNOSIS — G8929 Other chronic pain: Secondary | ICD-10-CM | POA: Diagnosis not present

## 2021-09-04 DIAGNOSIS — I2510 Atherosclerotic heart disease of native coronary artery without angina pectoris: Secondary | ICD-10-CM | POA: Diagnosis not present

## 2021-09-04 DIAGNOSIS — M19029 Primary osteoarthritis, unspecified elbow: Secondary | ICD-10-CM | POA: Diagnosis not present

## 2021-09-04 DIAGNOSIS — I10 Essential (primary) hypertension: Secondary | ICD-10-CM | POA: Diagnosis not present

## 2021-09-04 DIAGNOSIS — G4733 Obstructive sleep apnea (adult) (pediatric): Secondary | ICD-10-CM | POA: Diagnosis not present

## 2021-09-05 MED FILL — Heparin Sodium (Porcine) Inj 1000 Unit/ML: INTRAMUSCULAR | Qty: 30 | Status: AC

## 2021-09-05 MED FILL — Sodium Chloride IV Soln 0.9%: INTRAVENOUS | Qty: 1000 | Status: AC

## 2021-09-06 ENCOUNTER — Other Ambulatory Visit: Payer: Self-pay | Admitting: Family Medicine

## 2021-09-06 DIAGNOSIS — M545 Low back pain, unspecified: Secondary | ICD-10-CM

## 2021-09-08 DIAGNOSIS — M19041 Primary osteoarthritis, right hand: Secondary | ICD-10-CM | POA: Diagnosis not present

## 2021-09-08 DIAGNOSIS — G4733 Obstructive sleep apnea (adult) (pediatric): Secondary | ICD-10-CM | POA: Diagnosis not present

## 2021-09-08 DIAGNOSIS — N400 Enlarged prostate without lower urinary tract symptoms: Secondary | ICD-10-CM | POA: Diagnosis not present

## 2021-09-08 DIAGNOSIS — M48061 Spinal stenosis, lumbar region without neurogenic claudication: Secondary | ICD-10-CM | POA: Diagnosis not present

## 2021-09-08 DIAGNOSIS — M4726 Other spondylosis with radiculopathy, lumbar region: Secondary | ICD-10-CM | POA: Diagnosis not present

## 2021-09-08 DIAGNOSIS — I10 Essential (primary) hypertension: Secondary | ICD-10-CM | POA: Diagnosis not present

## 2021-09-08 DIAGNOSIS — M47817 Spondylosis without myelopathy or radiculopathy, lumbosacral region: Secondary | ICD-10-CM | POA: Diagnosis not present

## 2021-09-08 DIAGNOSIS — G8929 Other chronic pain: Secondary | ICD-10-CM | POA: Diagnosis not present

## 2021-09-08 DIAGNOSIS — M5116 Intervertebral disc disorders with radiculopathy, lumbar region: Secondary | ICD-10-CM | POA: Diagnosis not present

## 2021-09-08 DIAGNOSIS — K219 Gastro-esophageal reflux disease without esophagitis: Secondary | ICD-10-CM | POA: Diagnosis not present

## 2021-09-08 DIAGNOSIS — Z4789 Encounter for other orthopedic aftercare: Secondary | ICD-10-CM | POA: Diagnosis not present

## 2021-09-08 DIAGNOSIS — I2510 Atherosclerotic heart disease of native coronary artery without angina pectoris: Secondary | ICD-10-CM | POA: Diagnosis not present

## 2021-09-08 DIAGNOSIS — M5442 Lumbago with sciatica, left side: Secondary | ICD-10-CM | POA: Diagnosis not present

## 2021-09-08 DIAGNOSIS — M47812 Spondylosis without myelopathy or radiculopathy, cervical region: Secondary | ICD-10-CM | POA: Diagnosis not present

## 2021-09-08 DIAGNOSIS — M19042 Primary osteoarthritis, left hand: Secondary | ICD-10-CM | POA: Diagnosis not present

## 2021-09-08 DIAGNOSIS — M19029 Primary osteoarthritis, unspecified elbow: Secondary | ICD-10-CM | POA: Diagnosis not present

## 2021-09-08 NOTE — Telephone Encounter (Signed)
D/C 08/27/21. Requested Prescriptions  Refused Prescriptions Disp Refills  . celecoxib (CELEBREX) 200 MG capsule [Pharmacy Med Name: CELECOXIB 200 MG CAPSULE] 30 capsule 0    Sig: TAKE 1 CAPSULE BY MOUTH DAILY AS NEEDED     Analgesics:  COX2 Inhibitors Failed - 09/06/2021  6:51 AM      Failed - Manual Review: Labs are only required if the patient has taken medication for more than 8 weeks.      Failed - HGB in normal range and within 360 days    Hemoglobin  Date Value Ref Range Status  08/30/2021 10.8 (L) 13.0 - 17.0 g/dL Final  08/11/2021 13.9 13.0 - 17.7 g/dL Final         Failed - HCT in normal range and within 360 days    HCT  Date Value Ref Range Status  08/30/2021 32.2 (L) 39.0 - 52.0 % Final   Hematocrit  Date Value Ref Range Status  08/11/2021 42.3 37.5 - 51.0 % Final         Failed - AST in normal range and within 360 days    AST  Date Value Ref Range Status  08/27/2021 14 (L) 15 - 41 U/L Final         Passed - Cr in normal range and within 360 days    Creatinine, Ser  Date Value Ref Range Status  08/30/2021 0.70 0.61 - 1.24 mg/dL Final         Passed - ALT in normal range and within 360 days    ALT  Date Value Ref Range Status  08/27/2021 13 0 - 44 U/L Final         Passed - eGFR is 30 or above and within 360 days    GFR calc Af Amer  Date Value Ref Range Status  05/25/2019 114 >59 mL/min/1.73 Final   GFR, Estimated  Date Value Ref Range Status  08/30/2021 >60 >60 mL/min Final    Comment:    (NOTE) Calculated using the CKD-EPI Creatinine Equation (2021)    eGFR  Date Value Ref Range Status  08/11/2021 102 >59 mL/min/1.73 Final         Passed - Patient is not pregnant      Passed - Valid encounter within last 12 months    Recent Outpatient Visits          4 weeks ago Annual physical exam   [facility] Jerrol Banana., MD   1 month ago Olecranon bursitis of left elbow   [facility] Jerrol Banana.,  MD   8 months ago Essential (primary) hypertension   [facility] Jerrol Banana., MD   1 year ago Annual physical exam   [facility] Jerrol Banana., MD   1 year ago Spinal stenosis of lumbar region with neurogenic claudication   [facility] Jerrol Banana., MD      Future Appointments            In 2 days Jerrol Banana., MD [facility], [place]   In 5 months Jerrol Banana., MD [facility], [place]   In 8 months Ralene Bathe, MD [place]

## 2021-09-10 ENCOUNTER — Ambulatory Visit: Payer: BC Managed Care – PPO | Admitting: Family Medicine

## 2021-09-10 ENCOUNTER — Encounter: Payer: Self-pay | Admitting: Family Medicine

## 2021-09-10 VITALS — BP 112/73 | HR 78 | Resp 16

## 2021-09-10 DIAGNOSIS — M19042 Primary osteoarthritis, left hand: Secondary | ICD-10-CM | POA: Diagnosis not present

## 2021-09-10 DIAGNOSIS — M5136 Other intervertebral disc degeneration, lumbar region: Secondary | ICD-10-CM

## 2021-09-10 DIAGNOSIS — A419 Sepsis, unspecified organism: Secondary | ICD-10-CM | POA: Diagnosis not present

## 2021-09-10 DIAGNOSIS — M5442 Lumbago with sciatica, left side: Secondary | ICD-10-CM | POA: Diagnosis not present

## 2021-09-10 DIAGNOSIS — G8929 Other chronic pain: Secondary | ICD-10-CM | POA: Diagnosis not present

## 2021-09-10 DIAGNOSIS — G4733 Obstructive sleep apnea (adult) (pediatric): Secondary | ICD-10-CM

## 2021-09-10 DIAGNOSIS — I10 Essential (primary) hypertension: Secondary | ICD-10-CM | POA: Diagnosis not present

## 2021-09-10 DIAGNOSIS — G629 Polyneuropathy, unspecified: Secondary | ICD-10-CM

## 2021-09-10 DIAGNOSIS — K219 Gastro-esophageal reflux disease without esophagitis: Secondary | ICD-10-CM | POA: Diagnosis not present

## 2021-09-10 DIAGNOSIS — M47812 Spondylosis without myelopathy or radiculopathy, cervical region: Secondary | ICD-10-CM | POA: Diagnosis not present

## 2021-09-10 DIAGNOSIS — M19041 Primary osteoarthritis, right hand: Secondary | ICD-10-CM | POA: Diagnosis not present

## 2021-09-10 DIAGNOSIS — M47817 Spondylosis without myelopathy or radiculopathy, lumbosacral region: Secondary | ICD-10-CM | POA: Diagnosis not present

## 2021-09-10 DIAGNOSIS — M4802 Spinal stenosis, cervical region: Secondary | ICD-10-CM

## 2021-09-10 DIAGNOSIS — M4726 Other spondylosis with radiculopathy, lumbar region: Secondary | ICD-10-CM | POA: Diagnosis not present

## 2021-09-10 DIAGNOSIS — R6520 Severe sepsis without septic shock: Secondary | ICD-10-CM

## 2021-09-10 DIAGNOSIS — I2510 Atherosclerotic heart disease of native coronary artery without angina pectoris: Secondary | ICD-10-CM | POA: Diagnosis not present

## 2021-09-10 DIAGNOSIS — M5116 Intervertebral disc disorders with radiculopathy, lumbar region: Secondary | ICD-10-CM | POA: Diagnosis not present

## 2021-09-10 DIAGNOSIS — Z6841 Body Mass Index (BMI) 40.0 and over, adult: Secondary | ICD-10-CM

## 2021-09-10 DIAGNOSIS — M48061 Spinal stenosis, lumbar region without neurogenic claudication: Secondary | ICD-10-CM | POA: Diagnosis not present

## 2021-09-10 DIAGNOSIS — N400 Enlarged prostate without lower urinary tract symptoms: Secondary | ICD-10-CM | POA: Diagnosis not present

## 2021-09-10 DIAGNOSIS — M19029 Primary osteoarthritis, unspecified elbow: Secondary | ICD-10-CM | POA: Diagnosis not present

## 2021-09-10 DIAGNOSIS — M51369 Other intervertebral disc degeneration, lumbar region without mention of lumbar back pain or lower extremity pain: Secondary | ICD-10-CM

## 2021-09-10 DIAGNOSIS — Z4789 Encounter for other orthopedic aftercare: Secondary | ICD-10-CM | POA: Diagnosis not present

## 2021-09-10 NOTE — Progress Notes (Unsigned)
Established patient visit  I,April Miller,acting as a scribe for Wilhemena Durie, MD.,have documented all relevant documentation on the behalf of Wilhemena Durie, MD,as directed by  Wilhemena Durie, MD while in the presence of Wilhemena Durie, MD.   Patient: Terry Horn.   DOB: [birth year]   61 y.o. Male  MRN: [id number] Visit Date: 09/10/2021  Today's healthcare provider: Wilhemena Durie, MD   Chief Complaint  Patient presents with   Hospitalization Follow-up   Subjective    HPI  Patient had an emergency lumbar fusion on June 29 and is recovering nicely.  His leg pain that he was having immediately resolved.  He also was treated for sepsis and that has resolved also.  There was no indication of infection of the spine or surrounding area.  He is making slow progress at home but is doing well.  Physical therapy is helping.  Follow up Hospitalization  Patient was admitted to [facility] on 08/27/2021 and discharged on 08/30/2021. He was treated for Severe sepsis. Treatment for this included; see notes in chart. Telephone follow up was done on none He reports good compliance with treatment. He reports this condition is improved.  ----------------------------------------------------------------------------------------- -   Medications: Outpatient Medications Prior to Visit  Medication Sig   [START ON 09/19/2021] aspirin 81 MG tablet Take 1 tablet (81 mg total) by mouth daily.   Cholecalciferol 1000 UNITS tablet Take 1,000 Units by mouth daily.    Dextran 70-Hypromellose (ARTIFICIAL TEARS) 0.1-0.3 % SOLN Place 1 drop into both eyes daily as needed (Dry eye).   diclofenac (VOLTAREN) 50 MG EC tablet Take 1 tablet (50 mg total) by mouth 2 (two) times daily.   finasteride (PROSCAR) 5 MG tablet Take 1 tablet (5 mg total) by mouth daily.   fish oil-omega-3 fatty acids 1000 MG capsule Take 2,000 tablets by mouth daily.    gabapentin (NEURONTIN) 300 MG capsule TAKE ONE  CAPSULE BY MOUTH EVERY MORNING AND TAKE TWO CAPSULES BY MOUTH EVERY NIGHT AT BEDTIME (Patient taking differently: Take 300-600 mg by mouth See admin instructions. Take one capsule (300 mg) every morning and take two capsules (600 mg) every night at bedtime.)   hydrocortisone 2.5 % cream Apply topically 3 (three) times a week. 3 times weekly to scaly areas brows, Tuesday, Thursday, Saturday (Patient taking differently: Apply 1 Application topically 3 (three) times a week. 3 times weekly to scaly areas brows, Tuesday, Thursday, Saturday)   losartan-hydrochlorothiazide (HYZAAR) 100-12.5 MG tablet TAKE ONE TABLET BY MOUTH DAILY (Patient taking differently: Take 1 tablet by mouth daily.)   Misc Natural Products (GLUCOSAMINE CHONDROITIN COMPLX) TABS Take 2 tablets by mouth daily.    Multiple Vitamin tablet Take 1 tablet by mouth daily.    pravastatin (PRAVACHOL) 80 MG tablet Take 80 mg by mouth daily.   Probiotic Product (PROBIOTIC DAILY) CAPS Take 1 tablet by mouth daily.   tamsulosin (FLOMAX) 0.4 MG CAPS capsule Take 1 capsule (0.4 mg total) by mouth daily.   Turmeric, Curcuma Longa, POWD Take 1 tablet by mouth daily.    vitamin C (ASCORBIC ACID) 500 MG tablet Take 500 mg by mouth daily.    vitamin E 400 UNIT capsule Take 400 Units by mouth daily.    [DISCONTINUED] acetaminophen (TYLENOL) 325 MG tablet Take 2 tablets (650 mg total) by mouth every 6 (six) hours as needed for mild pain, moderate pain, fever or headache (or Fever >/= 101). (Patient not taking: Reported on  09/10/2021)   [DISCONTINUED] cyclobenzaprine (FLEXERIL) 10 MG tablet Take 1 tablet (10 mg total) by mouth at bedtime. (Patient not taking: Reported on 09/10/2021)   [DISCONTINUED] oxyCODONE (OXY IR/ROXICODONE) 5 MG immediate release tablet Take 1 tablet (5 mg total) by mouth every 3 (three) hours as needed for moderate pain ((score 4 to 6)). (Patient not taking: Reported on 09/10/2021)   [DISCONTINUED] polyethylene glycol (MIRALAX / GLYCOLAX)  17 g packet Take 17 g by mouth daily as needed for moderate constipation. (Patient not taking: Reported on 09/10/2021)   [DISCONTINUED] senna-docusate (SENOKOT-S) 8.6-50 MG tablet Take 1 tablet by mouth at bedtime. (Patient not taking: Reported on 09/10/2021)   No facility-administered medications prior to visit.    Review of Systems  Constitutional:  Negative for appetite change, chills and fever.  Respiratory:  Negative for chest tightness, shortness of breath and wheezing.   Cardiovascular:  Negative for chest pain and palpitations.  Gastrointestinal:  Negative for abdominal pain, nausea and vomiting.    Last thyroid functions Lab Results  Component Value Date   TSH 0.809 08/27/2021       Objective    BP 112/73 (BP Location: Right Arm, Patient Position: Sitting, Cuff Size: Large)   Pulse 78   Resp 16  BP Readings from Last 3 Encounters:  09/10/21 112/73  08/30/21 (!) 120/55  08/27/21 (!) 142/57   Wt Readings from Last 3 Encounters:  08/30/21 (!) 338 lb 15.7 oz (153.8 kg)  08/26/21 (!) 339 lb (153.8 kg)  08/11/21 (!) 339 lb (153.8 kg)      Physical Exam Constitutional:      General: He is not in acute distress.    Appearance: He is well-developed. He is obese.  HENT:     Head: Normocephalic and atraumatic.     Right Ear: Hearing normal.     Left Ear: Hearing normal.     Nose: Nose normal.  Eyes:     General: Lids are normal. No scleral icterus.       Right eye: No discharge.        Left eye: No discharge.     Conjunctiva/sclera: Conjunctivae normal.  Cardiovascular:     Rate and Rhythm: Normal rate and regular rhythm.     Heart sounds: Normal heart sounds.  Pulmonary:     Effort: Pulmonary effort is normal. No respiratory distress.  Skin:    General: Skin is warm and dry.     Findings: No lesion or rash.  Neurological:     General: No focal deficit present.     Mental Status: He is alert and oriented to person, place, and time.  Psychiatric:        Mood  and Affect: Mood normal.        Speech: Speech normal.        Behavior: Behavior normal.        Thought Content: Thought content normal.        Judgment: Judgment normal.       No results found for any visits on 09/10/21.  Assessment & Plan     1. DDD (degenerative disc disease), lumbar This post recent lumbar fusion by Dr. Arnoldo Morale doing well  2. Cervical central spinal stenosis Followed by Dr. Arnoldo Morale  3. Severe sepsis ([place]) Resolved.  Patient back home with wife.  Home health/physical therapy helping  4. Obstructive sleep apnea syndrome   5. Class 3 severe obesity due to excess calories with serious comorbidity and body mass index (BMI)  of 45.0 to 49.9 in adult [facility])   6. Neuropathy    No follow-ups on file.      I, Wilhemena Durie, MD, have reviewed all documentation for this visit. The documentation on 09/11/21 for the exam, diagnosis, procedures, and orders are all accurate and complete.    Ritika Hellickson Cranford Mon, MD  [facility] [phone] (phone) [phone] (fax)  [place]

## 2021-09-12 DIAGNOSIS — M47817 Spondylosis without myelopathy or radiculopathy, lumbosacral region: Secondary | ICD-10-CM | POA: Diagnosis not present

## 2021-09-12 DIAGNOSIS — N400 Enlarged prostate without lower urinary tract symptoms: Secondary | ICD-10-CM | POA: Diagnosis not present

## 2021-09-12 DIAGNOSIS — M19042 Primary osteoarthritis, left hand: Secondary | ICD-10-CM | POA: Diagnosis not present

## 2021-09-12 DIAGNOSIS — G8929 Other chronic pain: Secondary | ICD-10-CM | POA: Diagnosis not present

## 2021-09-12 DIAGNOSIS — Z4789 Encounter for other orthopedic aftercare: Secondary | ICD-10-CM | POA: Diagnosis not present

## 2021-09-12 DIAGNOSIS — G4733 Obstructive sleep apnea (adult) (pediatric): Secondary | ICD-10-CM | POA: Diagnosis not present

## 2021-09-12 DIAGNOSIS — M47812 Spondylosis without myelopathy or radiculopathy, cervical region: Secondary | ICD-10-CM | POA: Diagnosis not present

## 2021-09-12 DIAGNOSIS — K219 Gastro-esophageal reflux disease without esophagitis: Secondary | ICD-10-CM | POA: Diagnosis not present

## 2021-09-12 DIAGNOSIS — M19029 Primary osteoarthritis, unspecified elbow: Secondary | ICD-10-CM | POA: Diagnosis not present

## 2021-09-12 DIAGNOSIS — M19041 Primary osteoarthritis, right hand: Secondary | ICD-10-CM | POA: Diagnosis not present

## 2021-09-12 DIAGNOSIS — I10 Essential (primary) hypertension: Secondary | ICD-10-CM | POA: Diagnosis not present

## 2021-09-12 DIAGNOSIS — M5116 Intervertebral disc disorders with radiculopathy, lumbar region: Secondary | ICD-10-CM | POA: Diagnosis not present

## 2021-09-12 DIAGNOSIS — M5442 Lumbago with sciatica, left side: Secondary | ICD-10-CM | POA: Diagnosis not present

## 2021-09-12 DIAGNOSIS — I2510 Atherosclerotic heart disease of native coronary artery without angina pectoris: Secondary | ICD-10-CM | POA: Diagnosis not present

## 2021-09-12 DIAGNOSIS — M48061 Spinal stenosis, lumbar region without neurogenic claudication: Secondary | ICD-10-CM | POA: Diagnosis not present

## 2021-09-12 DIAGNOSIS — M4726 Other spondylosis with radiculopathy, lumbar region: Secondary | ICD-10-CM | POA: Diagnosis not present

## 2021-09-16 DIAGNOSIS — M47816 Spondylosis without myelopathy or radiculopathy, lumbar region: Secondary | ICD-10-CM | POA: Diagnosis not present

## 2021-09-22 ENCOUNTER — Encounter: Payer: Self-pay | Admitting: Family Medicine

## 2021-10-21 DIAGNOSIS — I10 Essential (primary) hypertension: Secondary | ICD-10-CM | POA: Diagnosis not present

## 2021-10-21 DIAGNOSIS — E782 Mixed hyperlipidemia: Secondary | ICD-10-CM | POA: Diagnosis not present

## 2021-10-21 DIAGNOSIS — I2510 Atherosclerotic heart disease of native coronary artery without angina pectoris: Secondary | ICD-10-CM | POA: Diagnosis not present

## 2021-10-21 DIAGNOSIS — G4733 Obstructive sleep apnea (adult) (pediatric): Secondary | ICD-10-CM | POA: Diagnosis not present

## 2021-10-23 DIAGNOSIS — Z86010 Personal history of colonic polyps: Secondary | ICD-10-CM | POA: Diagnosis not present

## 2021-10-27 ENCOUNTER — Encounter: Payer: Self-pay | Admitting: Family Medicine

## 2021-10-29 ENCOUNTER — Ambulatory Visit (INDEPENDENT_AMBULATORY_CARE_PROVIDER_SITE_OTHER): Payer: BC Managed Care – PPO | Admitting: Family Medicine

## 2021-10-29 ENCOUNTER — Encounter: Payer: Self-pay | Admitting: Family Medicine

## 2021-10-29 VITALS — BP 121/58 | HR 72 | Temp 97.6°F | Resp 16 | Wt 339.0 lb

## 2021-10-29 DIAGNOSIS — M79602 Pain in left arm: Secondary | ICD-10-CM

## 2021-10-29 DIAGNOSIS — G5712 Meralgia paresthetica, left lower limb: Secondary | ICD-10-CM | POA: Diagnosis not present

## 2021-10-29 DIAGNOSIS — M25561 Pain in right knee: Secondary | ICD-10-CM

## 2021-10-29 DIAGNOSIS — M5136 Other intervertebral disc degeneration, lumbar region: Secondary | ICD-10-CM | POA: Diagnosis not present

## 2021-10-29 DIAGNOSIS — D171 Benign lipomatous neoplasm of skin and subcutaneous tissue of trunk: Secondary | ICD-10-CM | POA: Diagnosis not present

## 2021-10-29 DIAGNOSIS — M25562 Pain in left knee: Secondary | ICD-10-CM

## 2021-10-29 DIAGNOSIS — G8929 Other chronic pain: Secondary | ICD-10-CM

## 2021-10-29 NOTE — Progress Notes (Signed)
I,Roshena L Chambers,acting as a scribe for Wilhemena Durie, MD.,have documented all relevant documentation on the behalf of Wilhemena Durie, MD,as directed by  Wilhemena Durie, MD while in the presence of Wilhemena Durie, MD.   Established patient visit   Patient: Terry Horn.   DOB: 1960-07-01   61 y.o. Male  MRN: [id number] Visit Date: 10/29/2021  Today's healthcare provider: Wilhemena Durie, MD   Chief Complaint  Patient presents with   Leg Pain   Subjective    Leg Pain  Incident onset: 2 months ago after having surgery. There was no injury mechanism. The pain is present in the left thigh. The quality of the pain is described as burning. Pertinent negatives include no numbness or tingling. Exacerbated by: sitting.   Patient has had a burning sensation in his left upper leg since his back surgery on 08/28/2021. Pain is more noticeable when sitting.   Mass: Patient reports having a mass on his back. He denies any back pain. He first notice the mass 2 weeks ago.   Medications: Outpatient Medications Prior to Visit  Medication Sig   Cholecalciferol 1000 UNITS tablet Take 1,000 Units by mouth daily.    Dextran 70-Hypromellose (ARTIFICIAL TEARS) 0.1-0.3 % SOLN Place 1 drop into both eyes daily as needed (Dry eye).   diclofenac (VOLTAREN) 50 MG EC tablet Take 1 tablet (50 mg total) by mouth 2 (two) times daily.   finasteride (PROSCAR) 5 MG tablet Take 1 tablet (5 mg total) by mouth daily.   fish oil-omega-3 fatty acids 1000 MG capsule Take 2,000 tablets by mouth daily.    gabapentin (NEURONTIN) 300 MG capsule TAKE ONE CAPSULE BY MOUTH EVERY MORNING AND TAKE TWO CAPSULES BY MOUTH EVERY NIGHT AT BEDTIME (Patient taking differently: Take 300-600 mg by mouth See admin instructions. Take one capsule (300 mg) every morning and take two capsules (600 mg) every night at bedtime.)   hydrocortisone 2.5 % cream Apply topically 3 (three) times a week. 3 times weekly to  scaly areas brows, Tuesday, Thursday, Saturday (Patient taking differently: Apply 1 Application topically 3 (three) times a week. 3 times weekly to scaly areas brows, Tuesday, Thursday, Saturday)   losartan-hydrochlorothiazide (HYZAAR) 100-12.5 MG tablet TAKE ONE TABLET BY MOUTH DAILY (Patient taking differently: Take 1 tablet by mouth daily.)   Misc Natural Products (GLUCOSAMINE CHONDROITIN COMPLX) TABS Take 2 tablets by mouth daily.    Multiple Vitamin tablet Take 1 tablet by mouth daily.    pravastatin (PRAVACHOL) 80 MG tablet Take 80 mg by mouth daily.   Probiotic Product (PROBIOTIC DAILY) CAPS Take 1 tablet by mouth daily.   tamsulosin (FLOMAX) 0.4 MG CAPS capsule Take 1 capsule (0.4 mg total) by mouth daily.   Turmeric, Curcuma Longa, POWD Take 1 tablet by mouth daily.    vitamin C (ASCORBIC ACID) 500 MG tablet Take 500 mg by mouth daily.    vitamin E 400 UNIT capsule Take 400 Units by mouth daily.    [DISCONTINUED] aspirin 81 MG tablet Take 1 tablet (81 mg total) by mouth daily. (Patient not taking: Reported on 10/29/2021)   No facility-administered medications prior to visit.    Review of Systems  Constitutional:  Negative for appetite change, chills and fever.  Respiratory:  Negative for chest tightness, shortness of breath and wheezing.   Cardiovascular:  Negative for chest pain and palpitations.  Gastrointestinal:  Negative for abdominal pain, nausea and vomiting.  Neurological:  Negative for  tingling and numbness.    Last vitamin B12 and Folate Lab Results  Component Value Date   VITAMINB12 699 08/29/2019   FOLATE 17.3 08/29/2019       Objective    BP (!) 121/58 (BP Location: Right Arm, Patient Position: Sitting, Cuff Size: Large)   Pulse 72   Temp 97.6 F (36.4 C) (Oral)   Resp 16   Wt (!) 339 lb (153.8 kg)   SpO2 97% Comment: room air  BMI 47.28 kg/m  BP Readings from Last 3 Encounters:  10/29/21 (!) 121/58  09/10/21 112/73  08/30/21 (!) 120/55   Wt Readings  from Last 3 Encounters:  10/29/21 (!) 339 lb (153.8 kg)  08/30/21 (!) 338 lb 15.7 oz (153.8 kg)  08/26/21 (!) 339 lb (153.8 kg)      Physical Exam Vitals reviewed.  Constitutional:      General: He is not in acute distress.    Appearance: He is well-developed.  HENT:     Head: Normocephalic and atraumatic.     Right Ear: Hearing normal.     Left Ear: Hearing normal.     Nose: Nose normal.  Eyes:     General: Lids are normal. No scleral icterus.       Right eye: No discharge.        Left eye: No discharge.     Conjunctiva/sclera: Conjunctivae normal.  Cardiovascular:     Rate and Rhythm: Normal rate and regular rhythm.     Heart sounds: Normal heart sounds.  Pulmonary:     Effort: Pulmonary effort is normal. No respiratory distress.  Musculoskeletal:     Comments: FROM in left shoulder and no tenderness in the left upper arm humerus. No axillary adenopathy  Skin:    General: Skin is warm and dry.     Findings: No lesion or rash.     Comments: No rash Lipoma thoracic area  Neurological:     General: No focal deficit present.     Mental Status: He is alert and oriented to person, place, and time.     Comments: Sensory changes in his leg are left lateral thigh  Psychiatric:        Mood and Affect: Mood normal.        Speech: Speech normal.        Behavior: Behavior normal.        Thought Content: Thought content normal.        Judgment: Judgment normal.       No results found for any visits on 10/29/21.  Assessment & Plan     1. Meralgia paresthetica of left side Symptoms worsen may need to go back to his neurosurgeon Insurance at this time.  2. Lipoma of torso This is a lipoma.  We will follow this.  3. Chronic knee pain (Bilateral)   4. DDD (degenerative disc disease), lumbar   5. Pain of left upper extremity Not exertional.  No trauma.  They need orthopedic referral but for now we will just follow this. I do not think this is anginal equivalent at  all.   No follow-ups on file.      I, Wilhemena Durie, MD, have reviewed all documentation for this visit. The documentation on 11/02/21 for the exam, diagnosis, procedures, and orders are all accurate and complete.    Winfield Caba Cranford Mon, MD  [facility] [phone] (phone) [phone] (fax)  [place]

## 2021-11-09 ENCOUNTER — Encounter: Payer: Self-pay | Admitting: Family Medicine

## 2021-12-02 ENCOUNTER — Ambulatory Visit: Payer: Self-pay | Admitting: *Deleted

## 2021-12-02 NOTE — Telephone Encounter (Signed)
Summary: RSV/pneumonia vaccine questions.   Pt wanting to know if he should get RSV vaccine recently received COVID booster 11/27/2021. Pharmacy advised him to ask his PCP.  Pt asking if he is too young to get pneumonia shot.   Pt seeking clinical advice.      Reason for Disposition  General information question, no triage required and triager able to answer question  Answer Assessment - Initial Assessment Questions 1. REASON FOR CALL or QUESTION: "What is your reason for calling today?" or "How can I best help you?" or "What question do you have that I can help answer?"     Patient wants to know if he needs to get pneumonia vaccine and RSV vaccine. Pneumococcal vaccine- 53 or older, 43 or older with certain chronic conditions: diabetes, asthma, COPD, chronic heart disease, or chronic kidney failure RSV- high risk for developing complications with viral exposure  Patient does not believe he has any health conditions that would make him high risk- will send to practice for review and /or changes in recommendations  Protocols used: Information Only Call - No Triage-A-AH

## 2021-12-02 NOTE — Telephone Encounter (Signed)
  Agree with original recommendation for this patient in regards to the RSV vaccine.  Indications for RSV Vaccination for adults 60 years and older include the following:  -COPD -Asthma -Chronic lung disease -Heart failure -Diabetes type 1 or 2 -Advanced kidney disease -Advanced liver disease  Reviewed patient's medical history and he does not have any of the above mentioned conditions.   Pneumonia vaccine is indicated for patient 32 years & older.  Patient is also appropriate for this vaccine with the following conditions: -Alcoholism -Cerebrospinal fluid leak -Heart disease -Liver disease -Lung disease -Kidney disease -History of smoking -Cochlear implant -Functional or congenital asplenia -Immunodeficiencies -Cancer/immunosuppression -Diabetes -Hodgkin disease -HIV -Low white blood cell count -Multiple myeloma -Sickle cell disease -Solid organ transplant  Eulis Foster, MD  [facility]  [phone]

## 2021-12-19 DIAGNOSIS — Z6841 Body Mass Index (BMI) 40.0 and over, adult: Secondary | ICD-10-CM | POA: Diagnosis not present

## 2021-12-19 DIAGNOSIS — M47816 Spondylosis without myelopathy or radiculopathy, lumbar region: Secondary | ICD-10-CM | POA: Diagnosis not present

## 2021-12-19 DIAGNOSIS — M4316 Spondylolisthesis, lumbar region: Secondary | ICD-10-CM | POA: Diagnosis not present

## 2022-02-09 NOTE — Progress Notes (Unsigned)
I,April Miller,acting as a scribe for Lelon Huh, MD.,have documented all relevant documentation on the behalf of Lelon Huh, MD,as directed by  Lelon Huh, MD while in the presence of Lelon Huh, MD.   Established patient visit   Patient: Terry Horn.   DOB: 11/23/1960   61 y.o. Male  MRN: [id number] Visit Date: 02/10/2022  Today's healthcare provider: Lelon Huh, MD   Chief Complaint  Patient presents with   Hypertension   Subjective    HPI  Hypertension, follow-up  BP Readings from Last 3 Encounters:  02/10/22 114/68  10/29/21 (!) 121/58  09/10/21 112/73   Wt Readings from Last 3 Encounters:  02/10/22 (!) 343 lb (155.6 kg)  10/29/21 (!) 339 lb (153.8 kg)  08/30/21 (!) 338 lb 15.7 oz (153.8 kg)     He was last seen for hypertension 6 months ago.  Management since that visit includes; taking losartan-hydrochlorothiazide 100-12.5 mg.   Outside blood pressures are not checking.  Pertinent labs Lab Results  Component Value Date   CHOL 171 08/11/2021   HDL 63 08/11/2021   LDLCALC 97 08/11/2021   TRIG 58 08/11/2021   CHOLHDL 2.7 08/11/2021   Lab Results  Component Value Date   NA 140 08/30/2021   K 3.5 08/30/2021   CREATININE 0.70 08/30/2021   GFRNONAA >60 08/30/2021   GLUCOSE 110 (H) 08/30/2021   TSH 0.809 08/27/2021     The 10-year ASCVD risk score (Arnett DK, et al., 2019) is: 6.6%  He does report numbness and tingling in his fingers for several months.  Lab Results  Component Value Date   HGBA1C 5.7 (H) 08/11/2021    Lab Results  Component Value Date   [id number] 992 08/29/2019    ---------------------------------------------------------------------------------------------------   Medications: Outpatient Medications Prior to Visit  Medication Sig   Cholecalciferol 1000 UNITS tablet Take 1,000 Units by mouth daily.    Dextran 70-Hypromellose (ARTIFICIAL TEARS) 0.1-0.3 % SOLN Place 1 drop into both eyes daily as needed  (Dry eye).   diclofenac (VOLTAREN) 50 MG EC tablet Take 1 tablet (50 mg total) by mouth 2 (two) times daily.   finasteride (PROSCAR) 5 MG tablet Take 1 tablet (5 mg total) by mouth daily.   fish oil-omega-3 fatty acids 1000 MG capsule Take 2,000 tablets by mouth daily.    gabapentin (NEURONTIN) 300 MG capsule TAKE ONE CAPSULE BY MOUTH EVERY MORNING AND TAKE TWO CAPSULES BY MOUTH EVERY NIGHT AT BEDTIME (Patient taking differently: Take 300-600 mg by mouth See admin instructions. Take one capsule (300 mg) every morning and take two capsules (600 mg) every night at bedtime.)   hydrocortisone 2.5 % cream Apply topically 3 (three) times a week. 3 times weekly to scaly areas brows, Tuesday, Thursday, Saturday (Patient taking differently: Apply 1 Application topically 3 (three) times a week. 3 times weekly to scaly areas brows, Tuesday, Thursday, Saturday)   losartan-hydrochlorothiazide (HYZAAR) 100-12.5 MG tablet TAKE ONE TABLET BY MOUTH DAILY (Patient taking differently: Take 1 tablet by mouth daily.)   Misc Natural Products (GLUCOSAMINE CHONDROITIN COMPLX) TABS Take 2 tablets by mouth daily.    Multiple Vitamin tablet Take 1 tablet by mouth daily.    pravastatin (PRAVACHOL) 80 MG tablet Take 80 mg by mouth daily.   Probiotic Product (PROBIOTIC DAILY) CAPS Take 1 tablet by mouth daily.   tamsulosin (FLOMAX) 0.4 MG CAPS capsule Take 1 capsule (0.4 mg total) by mouth daily.   Turmeric, Curcuma Longa, POWD Take 1 tablet  by mouth daily.    vitamin C (ASCORBIC ACID) 500 MG tablet Take 500 mg by mouth daily.    vitamin E 400 UNIT capsule Take 400 Units by mouth daily.    No facility-administered medications prior to visit.    Review of Systems  Constitutional:  Negative for appetite change, chills and fever.  Respiratory:  Negative for chest tightness, shortness of breath and wheezing.   Cardiovascular:  Negative for chest pain and palpitations.  Gastrointestinal:  Negative for abdominal pain, nausea and  vomiting.       Objective    BP 114/68 (BP Location: Left Arm, Patient Position: Sitting, Cuff Size: Large)   Pulse 68   Resp 16   Wt (!) 343 lb (155.6 kg)   SpO2 98%   BMI 47.84 kg/m    Physical Exam   General: Appearance:    Severely obese male in no acute distress  Eyes:    PERRL, conjunctiva/corneas clear, EOM's intact       Lungs:     Clear to auscultation bilaterally, respirations unlabored  Heart:    Normal heart rate. Normal rhythm. No murmurs, rubs, or gallops.    MS:   All extremities are intact.    Neurologic:   Awake, alert, oriented x 3. No apparent focal neurological defect.         Assessment & Plan     1. Essential (primary) hypertension Well controlled.  Continue current medications.   - TSH - Magnesium  2. Neuropathy  - Vitamin B12  3. Prediabetes  - Hemoglobin A1c   He reports he had covid and flu boosters at his pharmacy.      The entirety of the information documented in the History of Present Illness, Review of Systems and Physical Exam were personally obtained by me. Portions of this information were initially documented by the CMA and reviewed by me for thoroughness and accuracy.     Lelon Huh, MD  [facility] [phone] (phone) [phone] (fax)  [place]

## 2022-02-10 ENCOUNTER — Ambulatory Visit: Payer: BC Managed Care – PPO | Admitting: Family Medicine

## 2022-02-10 ENCOUNTER — Encounter: Payer: Self-pay | Admitting: Family Medicine

## 2022-02-10 VITALS — BP 114/68 | HR 68 | Resp 16 | Wt 343.0 lb

## 2022-02-10 DIAGNOSIS — R7303 Prediabetes: Secondary | ICD-10-CM

## 2022-02-10 DIAGNOSIS — I10 Essential (primary) hypertension: Secondary | ICD-10-CM | POA: Diagnosis not present

## 2022-02-10 DIAGNOSIS — G629 Polyneuropathy, unspecified: Secondary | ICD-10-CM

## 2022-02-11 LAB — MAGNESIUM: Magnesium: 2.2 mg/dL (ref 1.6–2.3)

## 2022-02-11 LAB — HEMOGLOBIN A1C
Est. average glucose Bld gHb Est-mCnc: 128 mg/dL
Hgb A1c MFr Bld: 6.1 % — ABNORMAL HIGH (ref 4.8–5.6)

## 2022-02-11 LAB — VITAMIN B12: Vitamin B-12: 615 pg/mL (ref 232–1245)

## 2022-02-11 LAB — TSH: TSH: 0.993 u[IU]/mL (ref 0.450–4.500)

## 2022-03-16 ENCOUNTER — Telehealth: Payer: Self-pay | Admitting: Family Medicine

## 2022-03-16 DIAGNOSIS — N401 Enlarged prostate with lower urinary tract symptoms: Secondary | ICD-10-CM

## 2022-03-16 MED ORDER — TAMSULOSIN HCL 0.4 MG PO CAPS: 0.4000 mg | ORAL_CAPSULE | Freq: Every day | ORAL | 1 refills | Status: AC

## 2022-03-16 NOTE — Telephone Encounter (Signed)
[facility] faxed refill request for the following medications:  tamsulosin (FLOMAX) 0.4 MG CAPS capsule     Please advise.

## 2022-04-29 ENCOUNTER — Telehealth: Payer: Self-pay | Admitting: Family Medicine

## 2022-04-29 NOTE — Telephone Encounter (Signed)
Medication Refill - Medication: diclofenac (VOLTAREN) 50 MG EC tablet [id number]   Has the patient contacted their pharmacy? Yes.   (Agent: If no, request that the patient contact the pharmacy for the refill. If patient does not wish to contact the pharmacy document the reason why and proceed with request.) (Agent: If yes, when and what did the pharmacy advise?)  Preferred Pharmacy (with phone number or street name):  [facility] [id number], [place], [place] [address] Phone: [phone]  Fax: [phone]   Has the patient been seen for an appointment in the last year OR does the patient have an upcoming appointment? Yes.    Agent: Please be advised that RX refills may take up to 3 business days. We ask that you follow-up with your pharmacy.

## 2022-04-30 DIAGNOSIS — G4733 Obstructive sleep apnea (adult) (pediatric): Secondary | ICD-10-CM | POA: Diagnosis not present

## 2022-04-30 MED ORDER — DICLOFENAC SODIUM 50 MG PO TBEC: 50.0000 mg | DELAYED_RELEASE_TABLET | Freq: Two times a day (BID) | ORAL | 0 refills | Status: AC

## 2022-04-30 NOTE — Telephone Encounter (Signed)
Refill has been sent to pharmacy. Patient will need appt for additional refills   Terry Foster, MD  [facility]

## 2022-05-23 IMAGING — CR DG LUMBAR SPINE 2-3V
2 series · 2 of 2 positions shown · non-contrast
Comparison: November 22, 2018

CLINICAL DATA: Localization images for posterior fusion

EXAM:
LUMBAR SPINE - 2-3 VIEW

[lateral (1 of 2)]
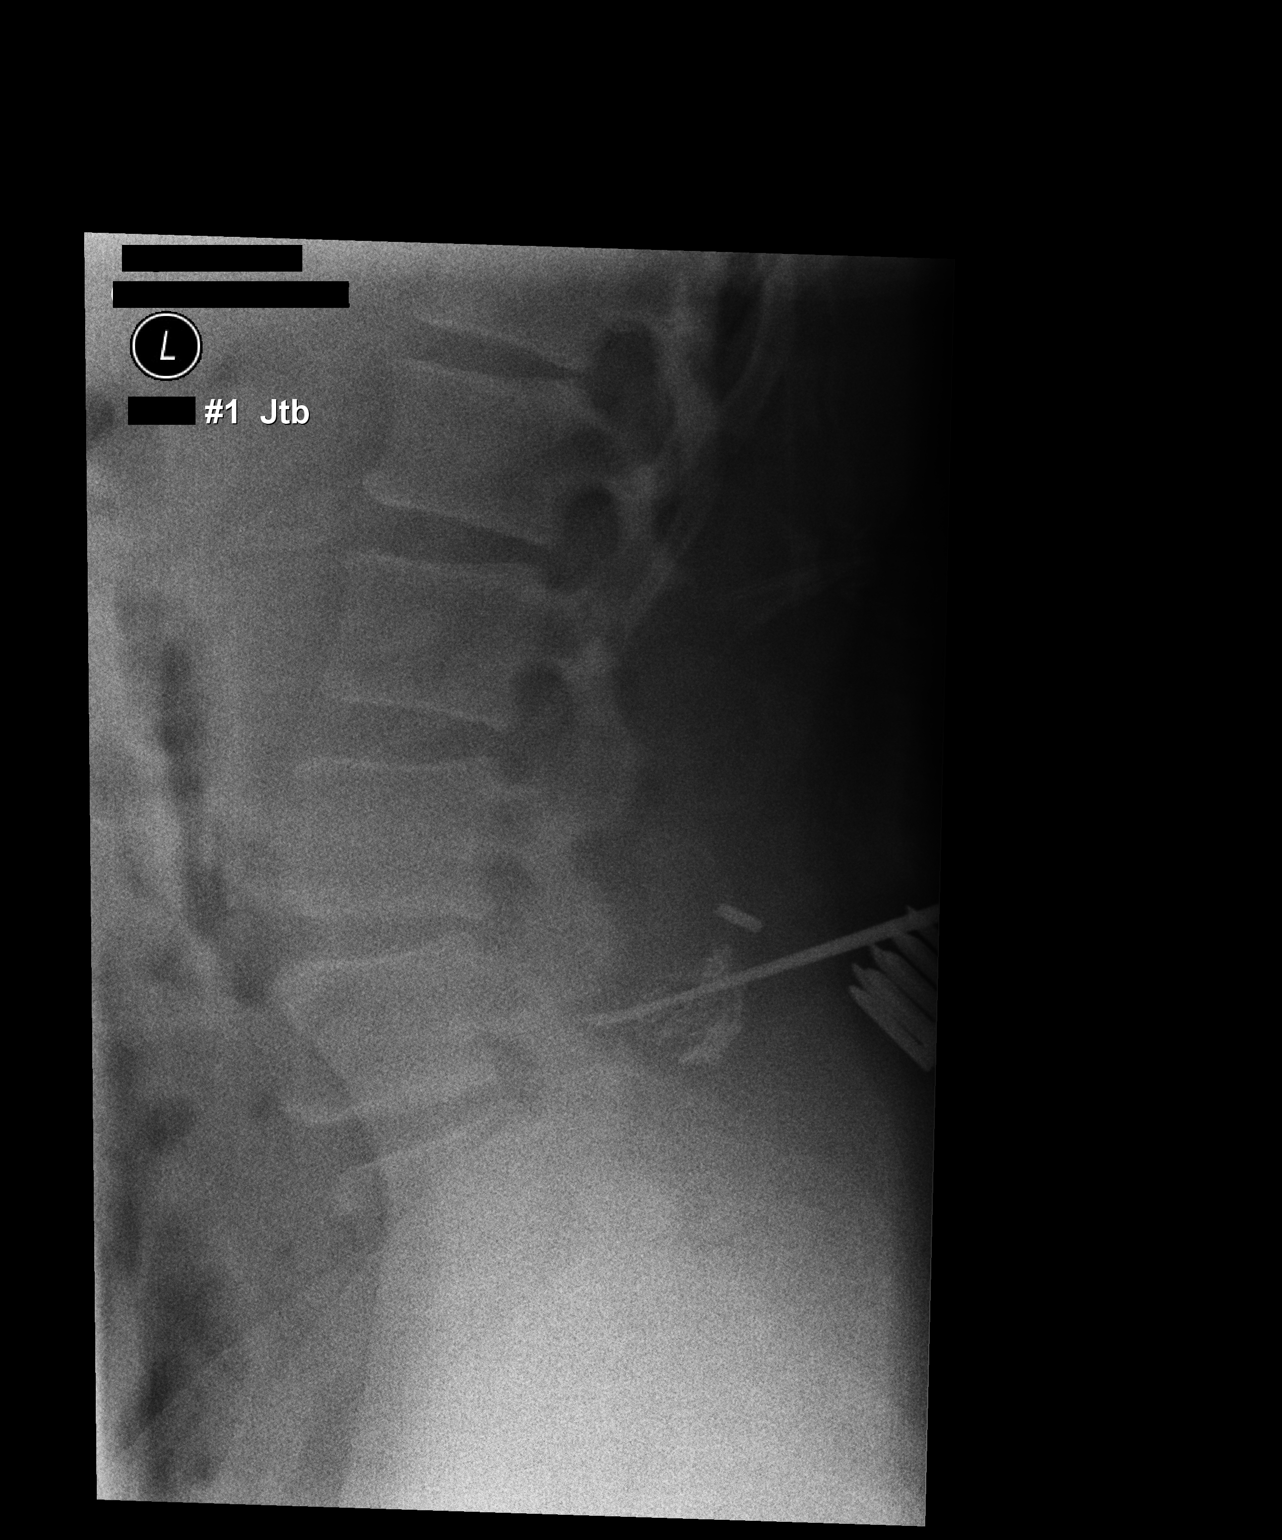

[lateral (2 of 2)]
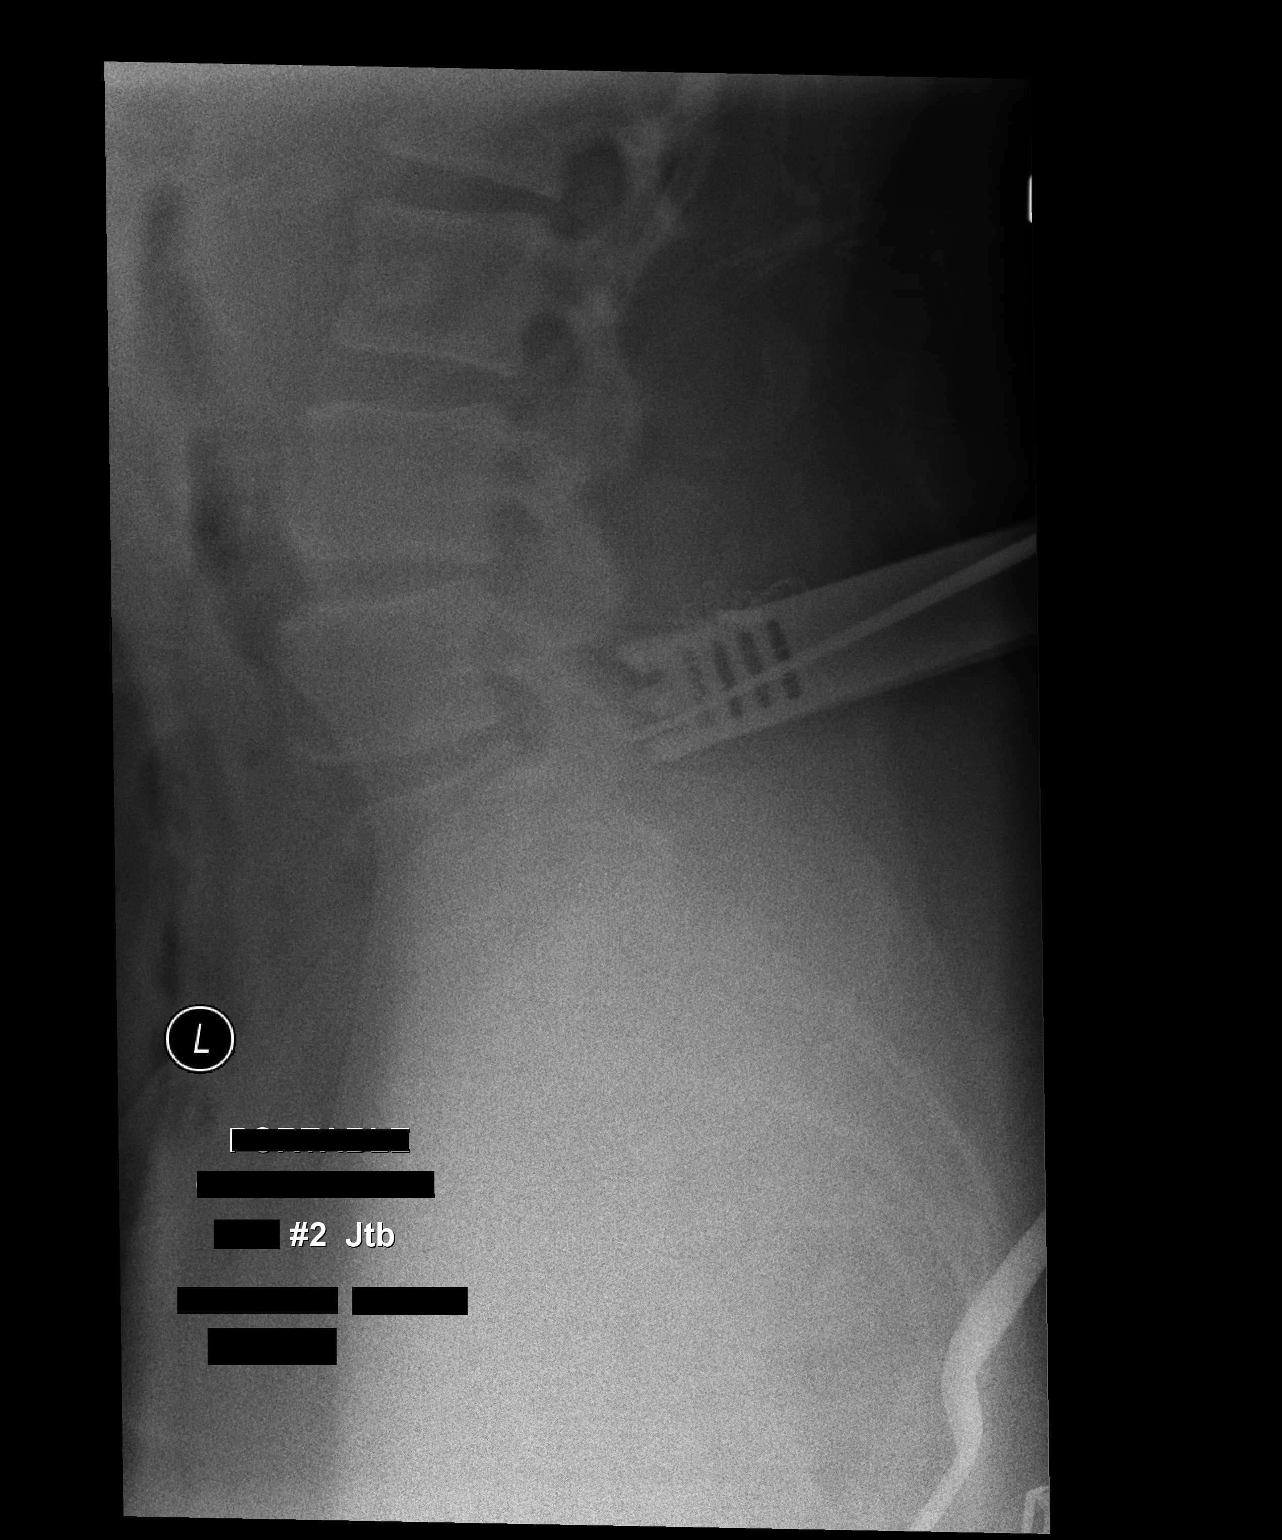

[2 of 2 positions shown; findings below may reference images not displayed]

FINDINGS: Initial cross-table lateral lumbar image is labeled #1. On this
image, metallic probe tip is inferior to the inferior most aspect of
the L4 vertebral body. No fracture or spondylolisthesis appreciable.

On second submitted cross-table lateral lumbar image labeled #2,
time stamped [DATE], metallic probe tip is posterior to the
superior aspect of L5 with cutting tool overlying a portion of the
L4 spinous process. No appreciable fracture or spondylolisthesis.
Disc space narrowing noted at L3-4.
IMPRESSION: On final submitted cross-table lateral image, metallic probe tip is
posterior to the superior aspect of the L5 vertebral body with
cutting tool overlying a portion of the L4 spinous process. No
evident fracture. Disc space narrowing at L3-4 noted.

## 2022-05-29 DIAGNOSIS — G4733 Obstructive sleep apnea (adult) (pediatric): Secondary | ICD-10-CM | POA: Diagnosis not present

## 2022-06-03 DIAGNOSIS — H524 Presbyopia: Secondary | ICD-10-CM | POA: Diagnosis not present

## 2022-06-04 ENCOUNTER — Ambulatory Visit: Payer: BC Managed Care – PPO | Admitting: Dermatology

## 2022-06-04 VITALS — BP 154/86 | HR 79

## 2022-06-04 DIAGNOSIS — L578 Other skin changes due to chronic exposure to nonionizing radiation: Secondary | ICD-10-CM | POA: Diagnosis not present

## 2022-06-04 DIAGNOSIS — Z85828 Personal history of other malignant neoplasm of skin: Secondary | ICD-10-CM

## 2022-06-04 DIAGNOSIS — L821 Other seborrheic keratosis: Secondary | ICD-10-CM

## 2022-06-04 DIAGNOSIS — L814 Other melanin hyperpigmentation: Secondary | ICD-10-CM

## 2022-06-04 DIAGNOSIS — Z1283 Encounter for screening for malignant neoplasm of skin: Secondary | ICD-10-CM | POA: Diagnosis not present

## 2022-06-04 DIAGNOSIS — L820 Inflamed seborrheic keratosis: Secondary | ICD-10-CM | POA: Diagnosis not present

## 2022-06-04 DIAGNOSIS — D1801 Hemangioma of skin and subcutaneous tissue: Secondary | ICD-10-CM

## 2022-06-04 DIAGNOSIS — D229 Melanocytic nevi, unspecified: Secondary | ICD-10-CM

## 2022-06-04 NOTE — Patient Instructions (Addendum)
Cryotherapy Aftercare  Wash gently with soap and water everyday.   Apply Vaseline and Band-Aid daily until healed.     Due to recent changes in healthcare laws, you may see results of your pathology and/or laboratory studies on MyChart before the doctors have had a chance to review them. We understand that in some cases there may be results that are confusing or concerning to you. Please understand that not all results are received at the same time and often the doctors may need to interpret multiple results in order to provide you with the best plan of care or course of treatment. Therefore, we ask that you please give us 2 business days to thoroughly review all your results before contacting the office for clarification. Should we see a critical lab result, you will be contacted sooner.   If You Need Anything After Your Visit  If you have any questions or concerns for your doctor, please call our main line at [phone] and press option 4 to reach your doctor's medical assistant. If no one answers, please leave a voicemail as directed and we will return your call as soon as possible. Messages left after 4 pm will be answered the following business day.   You may also send us a message via MyChart. We typically respond to MyChart messages within 1-2 business days.  For prescription refills, please ask your pharmacy to contact our office. Our fax number is [phone].  If you have an urgent issue when the clinic is closed that cannot wait until the next business day, you can page your doctor at the number below.    Please note that while we do our best to be available for urgent issues outside of office hours, we are not available 24/7.   If you have an urgent issue and are unable to reach us, you may choose to seek medical care at your doctor's office, retail clinic, urgent care center, or emergency room.  If you have a medical emergency, please immediately call 911 or go to the  emergency department.  Pager Numbers  - Dr. Kowalski: [phone]  - Dr. Moye: [phone]  - Dr. Stewart: [phone]  In the event of inclement weather, please call our main line at [phone] for an update on the status of any delays or closures.  Dermatology Medication Tips: Please keep the boxes that topical medications come in in order to help keep track of the instructions about where and how to use these. Pharmacies typically print the medication instructions only on the boxes and not directly on the medication tubes.   If your medication is too expensive, please contact our office at [phone] option 4 or send us a message through MyChart.   We are unable to tell what your co-pay for medications will be in advance as this is different depending on your insurance coverage. However, we may be able to find a substitute medication at lower cost or fill out paperwork to get insurance to cover a needed medication.   If a prior authorization is required to get your medication covered by your insurance company, please allow us 1-2 business days to complete this process.  Drug prices often vary depending on where the prescription is filled and some pharmacies may offer cheaper prices.  The website www.goodrx.com contains coupons for medications through different pharmacies. The prices here do not account for what the cost may be with help from insurance (it may be cheaper with your insurance), but the website can   give you the price if you did not use any insurance.  - You can print the associated coupon and take it with your prescription to the pharmacy.  - You may also stop by our office during regular business hours and pick up a GoodRx coupon card.  - If you need your prescription sent electronically to a different pharmacy, notify our office through [place] [facility] MyChart or by phone at [phone] option 4.     Si Usted Necesita Algo Despus de Su Visita  Tambin puede  enviarnos un mensaje a travs de MyChart. Por lo general respondemos a los mensajes de MyChart en el transcurso de 1 a 2 das hbiles.  Para renovar recetas, por favor pida a su farmacia que se ponga en contacto con nuestra oficina. Nuestro nmero de fax es el [phone].  Si tiene un asunto urgente cuando la clnica est cerrada y que no puede esperar hasta el siguiente da hbil, puede llamar/localizar a su doctor(a) al nmero que aparece a continuacin.   Por favor, tenga en cuenta que aunque hacemos todo lo posible para estar disponibles para asuntos urgentes fuera del horario de oficina, no estamos disponibles las 24 horas del da, los 7 das de la semana.   Si tiene un problema urgente y no puede comunicarse con nosotros, puede optar por buscar atencin mdica  en el consultorio de su doctor(a), en una clnica privada, en un centro de atencin urgente o en una sala de emergencias.  Si tiene una emergencia mdica, por favor llame inmediatamente al 911 o vaya a la sala de emergencias.  Nmeros de bper  - Dr. Kowalski: [phone]  - Dra. Moye: [phone]  - Dra. Stewart: [phone]  En caso de inclemencias del tiempo, por favor llame a nuestra lnea principal al [phone] para una actualizacin sobre el estado de cualquier retraso o cierre.  Consejos para la medicacin en dermatologa: Por favor, guarde las cajas en las que vienen los medicamentos de uso tpico para ayudarle a seguir las instrucciones sobre dnde y cmo usarlos. Las farmacias generalmente imprimen las instrucciones del medicamento slo en las cajas y no directamente en los tubos del medicamento.   Si su medicamento es muy caro, por favor, pngase en contacto con nuestra oficina llamando al [phone] y presione la opcin 4 o envenos un mensaje a travs de MyChart.   No podemos decirle cul ser su copago por los medicamentos por adelantado ya que esto es diferente dependiendo de la cobertura de su seguro.  Sin embargo, es posible que podamos encontrar un medicamento sustituto a menor costo o llenar un formulario para que el seguro cubra el medicamento que se considera necesario.   Si se requiere una autorizacin previa para que su compaa de seguros cubra su medicamento, por favor permtanos de 1 a 2 das hbiles para completar este proceso.  Los precios de los medicamentos varan con frecuencia dependiendo del lugar de dnde se surte la receta y alguna farmacias pueden ofrecer precios ms baratos.  El sitio web www.goodrx.com tiene cupones para medicamentos de diferentes farmacias. Los precios aqu no tienen en cuenta lo que podra costar con la ayuda del seguro (puede ser ms barato con su seguro), pero el sitio web puede darle el precio si no utiliz ningn seguro.  - Puede imprimir el cupn correspondiente y llevarlo con su receta a la farmacia.  - Tambin puede pasar por nuestra oficina durante el horario de atencin regular y recoger una tarjeta de cupones de GoodRx.  -   Si necesita que su receta se enve electrnicamente a una farmacia diferente, informe a nuestra oficina a travs de MyChart de [place] o por telfono llamando al [phone] y presione la opcin 4.  

## 2022-06-04 NOTE — Progress Notes (Signed)
   Follow-Up Visit   Subjective  Terry Horn. is a 62 y.o. male who presents for the following: Skin Cancer Screening and Full Body Skin Exam. Hx of BCC  The patient presents for Total-Body Skin Exam (TBSE) for skin cancer screening and mole check. The patient has spots, moles and lesions to be evaluated, some may be new or changing and the patient has concerns that these could be cancer.    The following portions of the chart were reviewed this encounter and updated as appropriate: medications, allergies, medical history  Review of Systems:  No other skin or systemic complaints except as noted in HPI or Assessment and Plan.  Objective  Well appearing patient in no apparent distress; mood and affect are within normal limits.  A full examination was performed including scalp, head, eyes, ears, nose, lips, neck, chest, axillae, abdomen, back, buttocks, bilateral upper extremities, bilateral lower extremities, hands, feet, fingers, toes, fingernails, and toenails. All findings within normal limits unless otherwise noted below.   Relevant physical exam findings are noted in the Assessment and Plan.    Assessment & Plan   LENTIGINES, SEBORRHEIC KERATOSES, HEMANGIOMAS - Benign normal skin lesions - Benign-appearing - Call for any changes  MELANOCYTIC NEVI - Tan-brown and/or pink-flesh-colored symmetric macules and papules - Benign appearing on exam today - Observation - Call clinic for new or changing moles - Recommend daily use of broad spectrum spf 30+ sunscreen to sun-exposed areas.   ACTINIC DAMAGE - Chronic condition, secondary to cumulative UV/sun exposure - diffuse scaly erythematous macules with underlying dyspigmentation - Recommend daily broad spectrum sunscreen SPF 30+ to sun-exposed areas, reapply every 2 hours as needed.  - Staying in the shade or wearing long sleeves, sun glasses (UVA+UVB protection) and wide brim hats (4-inch brim around the entire circumference  of the hat) are also recommended for sun protection.  - Call for new or changing lesions.  INFLAMED SEBORRHEIC KERATOSIS Exam: Erythematous keratotic or waxy stuck-on papule or plaque.  Symptomatic, irritating, patient would like treated.  Benign-appearing.  Call clinic for new or changing lesions.   Prior to procedure, discussed risks of blister formation, small wound, skin dyspigmentation, or rare scar following treatment. Recommend Vaseline ointment to treated areas while healing.  Destruction Procedure Note Destruction method: cryotherapy   Informed consent: discussed and consent obtained   Lesion destroyed using liquid nitrogen: Yes   Outcome: patient tolerated procedure well with no complications   Post-procedure details: wound care instructions given   Locations: face,scalp,chest  # of Lesions Treated: 18    HISTORY OF BASAL CELL CARCINOMA OF THE SKIN Left scapula  - No evidence of recurrence today - Recommend regular full body skin exams - Recommend daily broad spectrum sunscreen SPF 30+ to sun-exposed areas, reapply every 2 hours as needed.  - Call if any new or changing lesions are noted between office visits    SKIN CANCER SCREENING PERFORMED TODAY.   Return in about 1 year (around 06/04/2023) for TBSE, hx of BCC.  IMarye Round, CMA, am acting as scribe for Sarina Ser, MD .   Documentation: I have reviewed the above documentation for accuracy and completeness, and I agree with the above.  Sarina Ser, MD

## 2022-06-09 ENCOUNTER — Encounter: Payer: Self-pay | Admitting: Dermatology

## 2022-06-11 ENCOUNTER — Encounter: Payer: Self-pay | Admitting: Internal Medicine

## 2022-06-12 ENCOUNTER — Other Ambulatory Visit: Payer: Self-pay

## 2022-06-12 ENCOUNTER — Ambulatory Visit: Payer: BC Managed Care – PPO | Admitting: Registered Nurse

## 2022-06-12 ENCOUNTER — Encounter: Payer: Self-pay | Admitting: Internal Medicine

## 2022-06-12 ENCOUNTER — Encounter
Admission: RE | Disposition: A | Discharge status: Home / Self Care | Payer: Self-pay | Source: Ambulatory Visit | Attending: Gastroenterology

## 2022-06-12 ENCOUNTER — Ambulatory Visit
Admission: RE | Admit: 2022-06-12 | Discharge: 2022-06-12 | Disposition: A | Discharge status: Home / Self Care | Payer: BC Managed Care – PPO | Source: Ambulatory Visit | Attending: Gastroenterology | Admitting: Gastroenterology

## 2022-06-12 DIAGNOSIS — Z6841 Body Mass Index (BMI) 40.0 and over, adult: Secondary | ICD-10-CM | POA: Diagnosis not present

## 2022-06-12 DIAGNOSIS — Z87891 Personal history of nicotine dependence: Secondary | ICD-10-CM | POA: Diagnosis not present

## 2022-06-12 DIAGNOSIS — I10 Essential (primary) hypertension: Secondary | ICD-10-CM | POA: Insufficient documentation

## 2022-06-12 DIAGNOSIS — Z1211 Encounter for screening for malignant neoplasm of colon: Secondary | ICD-10-CM | POA: Diagnosis not present

## 2022-06-12 DIAGNOSIS — Z08 Encounter for follow-up examination after completed treatment for malignant neoplasm: Secondary | ICD-10-CM | POA: Insufficient documentation

## 2022-06-12 DIAGNOSIS — K219 Gastro-esophageal reflux disease without esophagitis: Secondary | ICD-10-CM | POA: Insufficient documentation

## 2022-06-12 DIAGNOSIS — K5730 Diverticulosis of large intestine without perforation or abscess without bleeding: Secondary | ICD-10-CM | POA: Insufficient documentation

## 2022-06-12 DIAGNOSIS — G4730 Sleep apnea, unspecified: Secondary | ICD-10-CM | POA: Diagnosis not present

## 2022-06-12 DIAGNOSIS — N400 Enlarged prostate without lower urinary tract symptoms: Secondary | ICD-10-CM | POA: Diagnosis not present

## 2022-06-12 DIAGNOSIS — Z86010 Personal history of colonic polyps: Secondary | ICD-10-CM | POA: Diagnosis not present

## 2022-06-12 DIAGNOSIS — Z09 Encounter for follow-up examination after completed treatment for conditions other than malignant neoplasm: Secondary | ICD-10-CM | POA: Insufficient documentation

## 2022-06-12 DIAGNOSIS — Z85828 Personal history of other malignant neoplasm of skin: Secondary | ICD-10-CM | POA: Insufficient documentation

## 2022-06-12 DIAGNOSIS — K648 Other hemorrhoids: Secondary | ICD-10-CM | POA: Diagnosis not present

## 2022-06-12 DIAGNOSIS — K640 First degree hemorrhoids: Secondary | ICD-10-CM | POA: Diagnosis not present

## 2022-06-12 DIAGNOSIS — M1990 Unspecified osteoarthritis, unspecified site: Secondary | ICD-10-CM | POA: Diagnosis not present

## 2022-06-12 DIAGNOSIS — E669 Obesity, unspecified: Secondary | ICD-10-CM | POA: Diagnosis not present

## 2022-06-12 DIAGNOSIS — I2510 Atherosclerotic heart disease of native coronary artery without angina pectoris: Secondary | ICD-10-CM | POA: Diagnosis not present

## 2022-06-12 HISTORY — PX: COLONOSCOPY WITH PROPOFOL: SHX5780

## 2022-06-12 SURGERY — COLONOSCOPY WITH PROPOFOL
Anesthesia: General

## 2022-06-12 MED ORDER — LIDOCAINE HCL (CARDIAC) PF 100 MG/5ML IV SOSY
PREFILLED_SYRINGE | INTRAVENOUS | Status: DC | PRN
  Administered 2022-06-12: 100 mg via INTRAVENOUS

## 2022-06-12 MED ORDER — SODIUM CHLORIDE 0.9 % IV SOLN: INTRAVENOUS | Status: DC

## 2022-06-12 MED ORDER — PHENYLEPHRINE 80 MCG/ML (10ML) SYRINGE FOR IV PUSH (FOR BLOOD PRESSURE SUPPORT)
PREFILLED_SYRINGE | INTRAVENOUS | Status: AC
  Filled 2022-06-12: qty 10

## 2022-06-12 MED ORDER — PROPOFOL 500 MG/50ML IV EMUL
INTRAVENOUS | Status: DC | PRN
  Administered 2022-06-12: 185.902 ug/kg/min via INTRAVENOUS

## 2022-06-12 MED ORDER — PROPOFOL 1000 MG/100ML IV EMUL
INTRAVENOUS | Status: AC
  Filled 2022-06-12: qty 200

## 2022-06-12 MED ORDER — PROPOFOL 1000 MG/100ML IV EMUL
INTRAVENOUS | Status: AC
  Filled 2022-06-12: qty 100

## 2022-06-12 MED ORDER — PROPOFOL 10 MG/ML IV BOLUS
INTRAVENOUS | Status: DC | PRN
  Administered 2022-06-12: 150 mg via INTRAVENOUS

## 2022-06-12 MED ORDER — OXYMETAZOLINE HCL 0.05 % NA SOLN
NASAL | Status: AC
  Filled 2022-06-12: qty 30

## 2022-06-12 NOTE — Transfer of Care (Signed)
Immediate Anesthesia Transfer of Care Note  Patient: Terry Horn.  Procedure(s) Performed: COLONOSCOPY WITH PROPOFOL  Patient Location: Endoscopy Unit  Anesthesia Type:General  Level of Consciousness: drowsy  Airway & Oxygen Therapy: Patient Spontanous Breathing  Post-op Assessment: Report given to RN and Post -op Vital signs reviewed and stable  Post vital signs: Reviewed and stable  Last Vitals:  Vitals Value Taken Time  BP 110/48 06/12/22 1237  Temp    Pulse 66 06/12/22 1237  Resp 23 06/12/22 1237  SpO2 94 % 06/12/22 1237  Vitals shown include unvalidated device data.  Last Pain:  Vitals:   06/12/22 1157  TempSrc: Temporal  PainSc: 0-No pain         Complications: No notable events documented.

## 2022-06-12 NOTE — Interval H&P Note (Signed)
History and Physical Interval Note:  06/12/2022 12:09 PM  Terry Horn.  has presented today for surgery, with the diagnosis of H/O colon polyps.  The various methods of treatment have been discussed with the patient and family. After consideration of risks, benefits and other options for treatment, the patient has consented to  Procedure(s): COLONOSCOPY WITH PROPOFOL (N/A) as a surgical intervention.  The patient's history has been reviewed, patient examined, no change in status, stable for surgery.  I have reviewed the patient's chart and labs.  Questions were answered to the patient's satisfaction.     Regis Bill  Ok to proceed with colonoscopy

## 2022-06-12 NOTE — Anesthesia Preprocedure Evaluation (Signed)
Anesthesia Evaluation  Patient identified by MRN, date of birth, ID band Patient awake    Reviewed: Allergy & Precautions, NPO status , Patient's Chart, lab work & pertinent test results  History of Anesthesia Complications Negative for: history of anesthetic complications  Airway Mallampati: III  TM Distance: <3 FB Neck ROM: full    Dental  (+) Chipped   Pulmonary sleep apnea and Continuous Positive Airway Pressure Ventilation , former smoker   Pulmonary exam normal        Cardiovascular Exercise Tolerance: Good hypertension, (-) angina + CAD  Normal cardiovascular exam     Neuro/Psych  Neuromuscular disease  negative psych ROS   GI/Hepatic Neg liver ROS,GERD  Controlled,,  Endo/Other  negative endocrine ROS    Renal/GU negative Renal ROS  negative genitourinary   Musculoskeletal   Abdominal   Peds  Hematology negative hematology ROS (+)   Anesthesia Other Findings Past Medical History: No date: Arthritis     Comment:  joints,hands,elbow No date: Basal cell carcinoma     Comment:  L scapula No date: Cancer     Comment:  skin cancer removed No date: GERD (gastroesophageal reflux disease) No date: Hypertension No date: Pneumonia No date: Sleep apnea     Comment:  with cpap  Past Surgical History: No date: BACK SURGERY No date: COLONOSCOPY 02/06/2016: EXCISION OF SKIN TAG; N/A     Comment:  Procedure: EXCISION OF SKIN TAG;  Surgeon: Earline Mayotte, MD;  Location: [facility] ORS;  Service: General;                Laterality: N/A; No date: TONSILLECTOMY  BMI    Body Mass Index: 47.70 kg/m      Reproductive/Obstetrics negative OB ROS                             Anesthesia Physical Anesthesia Plan  ASA: 3  Anesthesia Plan: General   Post-op Pain Management:    Induction: Intravenous  PONV Risk Score and Plan: Propofol infusion and TIVA  Airway Management  Planned: Natural Airway and Nasal Cannula  Additional Equipment:   Intra-op Plan:   Post-operative Plan:   Informed Consent: I have reviewed the patients History and Physical, chart, labs and discussed the procedure including the risks, benefits and alternatives for the proposed anesthesia with the patient or authorized representative who has indicated his/her understanding and acceptance.     Dental Advisory Given  Plan Discussed with: Anesthesiologist, CRNA and Surgeon  Anesthesia Plan Comments: (Patient consented for risks of anesthesia including but not limited to:  - adverse reactions to medications - risk of airway placement if required - damage to eyes, teeth, lips or other oral mucosa - nerve damage due to positioning  - sore throat or hoarseness - Damage to heart, brain, nerves, lungs, other parts of body or loss of life  Patient voiced understanding.)       Anesthesia Quick Evaluation

## 2022-06-12 NOTE — Op Note (Signed)
[facility] Gastroenterology Patient Name: Terry Horn Procedure Date: 06/12/2022 12:11 PM MRN: [id number] Account #: [id number] Date of Birth: 1960/04/05 Admit Type: Outpatient Age: 62 Room: [facility] ENDO ROOM 3 Gender: Male Note Status: Finalized Instrument Name: Prentice Docker [id number] Procedure:             Colonoscopy Indications:           Surveillance: Personal history of adenomatous polyps                         on last colonoscopy > 3 years ago Providers:             Eather Colas MD, MD Referring MD:          No Local Md, MD (Referring MD) Medicines:             Monitored Anesthesia Care Complications:         No immediate complications. Procedure:             Pre-Anesthesia Assessment:                        - Prior to the procedure, a History and Physical was                         performed, and patient medications and allergies were                         reviewed. The patient is competent. The risks and                         benefits of the procedure and the sedation options and                         risks were discussed with the patient. All questions                         were answered and informed consent was obtained.                         Patient identification and proposed procedure were                         verified by the physician, the nurse, the                         anesthesiologist, the anesthetist and the technician                         in the endoscopy suite. Mental Status Examination:                         alert and oriented. Airway Examination: normal                         oropharyngeal airway and neck mobility. Respiratory                         Examination: clear to auscultation. CV Examination:  normal. Prophylactic Antibiotics: The patient does not                         require prophylactic antibiotics. Prior                         Anticoagulants: The patient has taken no  anticoagulant                         or antiplatelet agents. ASA Grade Assessment: III - A                         patient with severe systemic disease. After reviewing                         the risks and benefits, the patient was deemed in                         satisfactory condition to undergo the procedure. The                         anesthesia plan was to use monitored anesthesia care                         (MAC). Immediately prior to administration of                         medications, the patient was re-assessed for adequacy                         to receive sedatives. The heart rate, respiratory                         rate, oxygen saturations, blood pressure, adequacy of                         pulmonary ventilation, and response to care were                         monitored throughout the procedure. The physical                         status of the patient was re-assessed after the                         procedure.                        After obtaining informed consent, the colonoscope was                         passed under direct vision. Throughout the procedure,                         the patient's blood pressure, pulse, and oxygen                         saturations were monitored continuously. The  Colonoscope was introduced through the anus and                         advanced to the the cecum, identified by appendiceal                         orifice and ileocecal valve. The colonoscopy was                         performed without difficulty. The patient tolerated                         the procedure well. The quality of the bowel                         preparation was good. The ileocecal valve, appendiceal                         orifice, and rectum were photographed. Findings:      The perianal and digital rectal examinations were normal.      A few small-mouthed diverticula were found in the sigmoid colon and       descending  colon.      Internal hemorrhoids were found during retroflexion. The hemorrhoids       were Grade I (internal hemorrhoids that do not prolapse).      The exam was otherwise without abnormality on direct and retroflexion       views. Impression:            - Diverticulosis in the sigmoid colon and in the                         descending colon.                        - Internal hemorrhoids.                        - The examination was otherwise normal on direct and                         retroflexion views.                        - No specimens collected. Recommendation:        - Discharge patient to home.                        - Resume previous diet.                        - Continue present medications.                        - Repeat colonoscopy in 5 years for surveillance.                        - Return to referring physician as previously                         scheduled. Procedure Code(s):     --- Professional ---  G0105, Colorectal cancer screening; colonoscopy on                         individual at high risk Diagnosis Code(s):     --- Professional ---                        Z86.010, Personal history of colonic polyps                        K64.0, First degree hemorrhoids                        K57.30, Diverticulosis of large intestine without                         perforation or abscess without bleeding CPT copyright 2022 American Medical Association. All rights reserved. The codes documented in this report are preliminary and upon coder review may  be revised to meet current compliance requirements. Eather Colas MD, MD 06/12/2022 12:35:57 PM Number of Addenda: 0 Note Initiated On: 06/12/2022 12:11 PM Scope Withdrawal Time: 0 hours 10 minutes 1 second  Total Procedure Duration: 0 hours 14 minutes 28 seconds  Estimated Blood Loss:  Estimated blood loss: none.      [facility]

## 2022-06-12 NOTE — Anesthesia Postprocedure Evaluation (Signed)
Anesthesia Post Note  Patient: Terry Horn.  Procedure(s) Performed: COLONOSCOPY WITH PROPOFOL  Patient location during evaluation: Endoscopy Anesthesia Type: General Level of consciousness: awake and alert Pain management: pain level controlled Vital Signs Assessment: post-procedure vital signs reviewed and stable Respiratory status: spontaneous breathing, nonlabored ventilation, respiratory function stable and patient connected to nasal cannula oxygen Cardiovascular status: blood pressure returned to baseline and stable Postop Assessment: no apparent nausea or vomiting Anesthetic complications: no   No notable events documented.   Last Vitals:  Vitals:   06/12/22 1157 06/12/22 1237  BP: 129/61 (!) 110/48  Pulse: (!) 43   Resp: 18   Temp: (!) 36.2 C (!) 36 C  SpO2: 99%     Last Pain:  Vitals:   06/12/22 1257  TempSrc:   PainSc: 0-No pain                 Cleda Mccreedy Kyston Gonce

## 2022-06-12 NOTE — H&P (Signed)
Outpatient short stay form Pre-procedure 06/12/2022  Regis Bill, MD  Primary Physician: Bosie Clos, MD  Reason for visit:  Surveillance  History of present illness:    62 y/o gentleman with history of obesity, hypertension, and BPH here for surveillance colonoscopy. Last colonoscopy in 2020 with three Ta's. Had large TVA in 2017. No blood thinners. No family history of GI malignancies. No significant abdominal surgeries.    Current Facility-Administered Medications:    0.9 %  sodium chloride infusion, , Intravenous, Continuous, Sanuel Ladnier, Rossie Muskrat, MD  Medications Prior to Admission  Medication Sig Dispense Refill Last Dose   Cholecalciferol 1000 UNITS tablet Take 1,000 Units by mouth daily.    06/12/2022 at 0600   Dextran 70-Hypromellose (ARTIFICIAL TEARS) 0.1-0.3 % SOLN Place 1 drop into both eyes daily as needed (Dry eye).   Past Week   diclofenac (VOLTAREN) 50 MG EC tablet Take 1 tablet (50 mg total) by mouth 2 (two) times daily. 60 tablet 0 Past Week   finasteride (PROSCAR) 5 MG tablet Take 1 tablet (5 mg total) by mouth daily. 90 tablet 3 06/11/2022 at 1800   fish oil-omega-3 fatty acids 1000 MG capsule Take 2,000 tablets by mouth daily.    06/11/2022 at 08900   gabapentin (NEURONTIN) 300 MG capsule TAKE ONE CAPSULE BY MOUTH EVERY MORNING AND TAKE TWO CAPSULES BY MOUTH EVERY NIGHT AT BEDTIME (Patient taking differently: Take 300-600 mg by mouth See admin instructions. Take one capsule (300 mg) every morning and take two capsules (600 mg) every night at bedtime.) 270 capsule 3 06/12/2022 at 0600   hydrocortisone 2.5 % cream Apply topically 3 (three) times a week. 3 times weekly to scaly areas brows, Tuesday, Thursday, Saturday (Patient taking differently: Apply 1 Application topically 3 (three) times a week. 3 times weekly to scaly areas brows, Tuesday, Thursday, Saturday) 30 g 6 Past Week   losartan-hydrochlorothiazide (HYZAAR) 100-12.5 MG tablet TAKE ONE TABLET BY MOUTH  DAILY (Patient taking differently: Take 1 tablet by mouth daily.) 90 tablet 3 06/11/2022 at 1200   Misc Natural Products (GLUCOSAMINE CHONDROITIN COMPLX) TABS Take 2 tablets by mouth daily.    06/12/2022 at 0600   Multiple Vitamin tablet Take 1 tablet by mouth daily.    06/12/2022   pravastatin (PRAVACHOL) 80 MG tablet Take 80 mg by mouth daily.   06/11/2022 at 1800   Probiotic Product (PROBIOTIC DAILY) CAPS Take 1 tablet by mouth daily.   06/12/2022 at 0600   tamsulosin (FLOMAX) 0.4 MG CAPS capsule Take 1 capsule (0.4 mg total) by mouth daily. 90 capsule 1 06/11/2022 at 1800   Turmeric, Curcuma Longa, POWD Take 1 tablet by mouth daily.    06/12/2022 at 0600   vitamin C (ASCORBIC ACID) 500 MG tablet Take 500 mg by mouth daily.    06/12/2022 at 0600   vitamin E 400 UNIT capsule Take 400 Units by mouth daily.    06/12/2022 at 0600     No Known Allergies   Past Medical History:  Diagnosis Date   Arthritis    joints,hands,elbow   Basal cell carcinoma    L scapula   Cancer    skin cancer removed   GERD (gastroesophageal reflux disease)    Hypertension    Pneumonia    Sleep apnea    with cpap    Review of systems:  Otherwise negative.    Physical Exam  Gen: Alert, oriented. Appears stated age.  HEENT: PERRLA. Lungs: No respiratory distress CV: RRR  Abd: soft, benign, no masses Ext: No edema    Planned procedures: Proceed with colonoscopy. The patient understands the nature of the planned procedure, indications, risks, alternatives and potential complications including but not limited to bleeding, infection, perforation, damage to internal organs and possible oversedation/side effects from anesthesia. The patient agrees and gives consent to proceed.  Please refer to procedure notes for findings, recommendations and patient disposition/instructions.     Regis Bill, MD [facility]

## 2022-06-16 ENCOUNTER — Encounter: Payer: Self-pay | Admitting: Gastroenterology

## 2022-06-29 DIAGNOSIS — G4733 Obstructive sleep apnea (adult) (pediatric): Secondary | ICD-10-CM | POA: Diagnosis not present

## 2022-07-08 DIAGNOSIS — I10 Essential (primary) hypertension: Secondary | ICD-10-CM | POA: Diagnosis not present

## 2022-07-08 DIAGNOSIS — G4733 Obstructive sleep apnea (adult) (pediatric): Secondary | ICD-10-CM | POA: Diagnosis not present

## 2022-07-08 DIAGNOSIS — M25561 Pain in right knee: Secondary | ICD-10-CM | POA: Diagnosis not present

## 2022-07-31 DIAGNOSIS — M4316 Spondylolisthesis, lumbar region: Secondary | ICD-10-CM | POA: Diagnosis not present

## 2022-09-14 ENCOUNTER — Other Ambulatory Visit: Payer: Self-pay | Admitting: Family Medicine

## 2022-09-14 DIAGNOSIS — N401 Enlarged prostate with lower urinary tract symptoms: Secondary | ICD-10-CM

## 2022-09-22 DIAGNOSIS — G4733 Obstructive sleep apnea (adult) (pediatric): Secondary | ICD-10-CM | POA: Diagnosis not present

## 2022-10-27 DIAGNOSIS — E782 Mixed hyperlipidemia: Secondary | ICD-10-CM | POA: Diagnosis not present

## 2022-10-27 DIAGNOSIS — I2510 Atherosclerotic heart disease of native coronary artery without angina pectoris: Secondary | ICD-10-CM | POA: Diagnosis not present

## 2022-10-27 DIAGNOSIS — I10 Essential (primary) hypertension: Secondary | ICD-10-CM | POA: Diagnosis not present

## 2022-11-28 DIAGNOSIS — G4733 Obstructive sleep apnea (adult) (pediatric): Secondary | ICD-10-CM | POA: Diagnosis not present

## 2022-12-28 DIAGNOSIS — G4733 Obstructive sleep apnea (adult) (pediatric): Secondary | ICD-10-CM | POA: Diagnosis not present

## 2023-01-15 DIAGNOSIS — G4733 Obstructive sleep apnea (adult) (pediatric): Secondary | ICD-10-CM | POA: Diagnosis not present

## 2023-01-28 DIAGNOSIS — G4733 Obstructive sleep apnea (adult) (pediatric): Secondary | ICD-10-CM | POA: Diagnosis not present

## 2023-02-05 ENCOUNTER — Ambulatory Visit
Admission: EM | Admit: 2023-02-05 | Discharge: 2023-02-05 | Disposition: A | Discharge status: Home / Self Care | Payer: BC Managed Care – PPO | Attending: Physician Assistant | Admitting: Physician Assistant

## 2023-02-05 DIAGNOSIS — J0120 Acute ethmoidal sinusitis, unspecified: Secondary | ICD-10-CM

## 2023-02-05 MED ORDER — ALBUTEROL SULFATE HFA 108 (90 BASE) MCG/ACT IN AERS: 2.0000 | INHALATION_SPRAY | Freq: Four times a day (QID) | RESPIRATORY_TRACT | 2 refills | Status: DC | PRN

## 2023-02-05 MED ORDER — ALBUTEROL SULFATE HFA 108 (90 BASE) MCG/ACT IN AERS: 2.0000 | INHALATION_SPRAY | Freq: Four times a day (QID) | RESPIRATORY_TRACT | 2 refills | Status: AC | PRN

## 2023-02-05 MED ORDER — AZITHROMYCIN 250 MG PO TABS: ORAL_TABLET | ORAL | 0 refills | Status: DC

## 2023-02-05 MED ORDER — AZITHROMYCIN 250 MG PO TABS: ORAL_TABLET | ORAL | 0 refills | Status: AC

## 2023-02-05 NOTE — ED Provider Notes (Signed)
Terry Horn    CSN: [id number] Arrival date & time: 02/05/23  1741      History   Chief Complaint Chief Complaint  Patient presents with   Nasal Congestion    HPI Terry Horn. is a 62 y.o. male.   Patient complains of a sinus infection.  Patient reports he is having facial pain and facial congestion.  Patient reports that he has had sinus infections in the past and this feels the same.  Patient reports he has had a slight cough.  Patient reports he uses an albuterol inhaler when he has a cough and congestion.  Patient denies any nausea or vomiting.  He reports he has not been posed to flu or COVID.  The history is provided by the patient. No language interpreter was used.    Past Medical History:  Diagnosis Date   Arthritis    joints,hands,elbow   Basal cell carcinoma    L scapula   Cancer (HCC)    skin cancer removed   GERD (gastroesophageal reflux disease)    Hypertension    Pneumonia    Sleep apnea    with cpap    Patient Active Problem List   Diagnosis Date Noted   Severe sepsis (HCC) 08/27/2021   Edema leg 08/27/2021   Coronary artery disease involving native coronary artery of native heart 10/10/2020   Spondylolisthesis, lumbar region 02/14/2020   Chronic knee pain (Bilateral) 09/09/2017   Osteoarthritis of knee (Secondary) (Bilateral) 09/09/2017   Congenital Lumbar central spinal stenosis 09/09/2017   Cervical central spinal stenosis 09/09/2017   Carpal tunnel syndrome (Bilateral) 09/09/2017   Abnormal nerve conduction studies 09/09/2017   Osteoarthritis of spine with radiculopathy, lumbosacral region 09/09/2017   Osteoarthritis of facet joint of lumbar spine 09/09/2017   Osteoarthritis involving multiple joints on both sides of body 09/09/2017   Cervicalgia 09/09/2017   Chronic neck pain 09/09/2017   Numbness of upper extremity (Bilateral) 09/09/2017   Numbness of lower extremity (Left) 09/09/2017   Spondylosis without myelopathy or  radiculopathy, lumbosacral region 09/09/2017   Lumbar facet syndrome (Midline pain pattern) (Bilateral) 09/09/2017   Lumbar facet arthropathy (L3-4, L4-L5, and L5-S1) (Bilateral) 09/09/2017   Low back pain 09/09/2017   Hyperglycemia 02/20/2016   Chronic low back pain (Primary Area of Pain) (Bilateral) w/ sciatica (Left) 02/20/2016   DDD (degenerative disc disease), lumbar 02/20/2016   Lumbar spondylosis 02/20/2016   DDD (degenerative disc disease), cervical 02/20/2016   Cervical spondylolysis 02/20/2016   History of colonic polyps 02/06/2016   Encounter for screening colonoscopy 01/21/2016   Mixed hyperlipidemia 10/01/2015   Chest pressure 07/26/2015   Paresthesia 07/26/2015   Arthritis 01/02/2015   Body mass index 45.0-49.9, adult (HCC) 01/02/2015   Essential (primary) hypertension 01/02/2015   Adiposity 01/02/2015   Apnea, sleep 01/02/2015   Avitaminosis D 01/02/2015    Past Surgical History:  Procedure Laterality Date   BACK SURGERY     COLONOSCOPY     COLONOSCOPY WITH PROPOFOL N/A 06/12/2022   Procedure: COLONOSCOPY WITH PROPOFOL;  Surgeon: Regis Bill, MD;  Location: [facility];  Service: Endoscopy;  Laterality: N/A;   EXCISION OF SKIN TAG N/A 02/06/2016   Procedure: EXCISION OF SKIN TAG;  Surgeon: Earline Mayotte, MD;  Location: [facility] ORS;  Service: General;  Laterality: N/A;   TONSILLECTOMY         Home Medications    Prior to Admission medications   Medication Sig Start Date End Date Taking? Authorizing Provider  Cholecalciferol 1000 UNITS tablet Take 1,000 Units by mouth daily.  04/08/10  Yes [provider]  Dextran 70-Hypromellose (ARTIFICIAL TEARS) 0.1-0.3 % SOLN Place 1 drop into both eyes daily as needed (Dry eye).   Yes [provider]  diclofenac (VOLTAREN) 50 MG EC tablet Take 1 tablet (50 mg total) by mouth 2 (two) times daily. 04/30/22  Yes Simmons-Robinson, Makiera, MD  finasteride (PROSCAR) 5 MG tablet Take 1 tablet (5 mg  total) by mouth daily. 08/30/21  Yes Bosie Clos, MD  fish oil-omega-3 fatty acids 1000 MG capsule Take 2,000 tablets by mouth daily.  04/08/10  Yes [provider]  gabapentin (NEURONTIN) 300 MG capsule TAKE ONE CAPSULE BY MOUTH EVERY MORNING AND TAKE TWO CAPSULES BY MOUTH EVERY NIGHT AT BEDTIME Patient taking differently: Take 300-600 mg by mouth See admin instructions. Take one capsule (300 mg) every morning and take two capsules (600 mg) every night at bedtime. 05/28/21  Yes Bosie Clos, MD  losartan-hydrochlorothiazide (HYZAAR) 100-12.5 MG tablet TAKE ONE TABLET BY MOUTH DAILY Patient taking differently: Take 1 tablet by mouth daily. 08/25/21  Yes Bosie Clos, MD  Misc Natural Products (GLUCOSAMINE CHONDROITIN COMPLX) TABS Take 2 tablets by mouth daily.    Yes [provider]  Multiple Vitamin tablet Take 1 tablet by mouth daily.  04/08/10  Yes [provider]  pravastatin (PRAVACHOL) 80 MG tablet Take 80 mg by mouth daily. 06/28/21  Yes [provider]  Probiotic Product (PROBIOTIC DAILY) CAPS Take 1 tablet by mouth daily.   Yes [provider]  tamsulosin (FLOMAX) 0.4 MG CAPS capsule Take 1 capsule (0.4 mg total) by mouth daily. 03/16/22  Yes Malva Limes, MD  Turmeric, Corwin Levins, POWD Take 1 tablet by mouth daily.    Yes [provider]  vitamin C (ASCORBIC ACID) 500 MG tablet Take 500 mg by mouth daily.  04/08/10  Yes [provider]  vitamin E 400 UNIT capsule Take 400 Units by mouth daily.  04/08/10  Yes [provider]  hydrocortisone 2.5 % cream Apply topically 3 (three) times a week. 3 times weekly to scaly areas brows, Tuesday, Thursday, Saturday Patient taking differently: Apply 1 Application topically 3 (three) times a week. 3 times weekly to scaly areas brows, Tuesday, Thursday, Saturday 05/30/21   Deirdre Evener, MD    Family History Family History  Problem Relation Age of Onset   Bipolar  disorder Mother    Thyroid disease Mother    Depression Sister    Prostate cancer Paternal Grandfather    Prostate cancer Paternal Uncle     Social History Social History   Tobacco Use   Smoking status: Former    Current packs/day: 0.00    Types: Cigarettes    Start date: 09/06/1974    Quit date: 09/05/1996    Years since quitting: 26.4   Smokeless tobacco: Never   Tobacco comments:    Quit in 1998  Vaping Use   Vaping status: Never Used  Substance Use Topics   Alcohol use: Yes    Comment: occasionally   Drug use: No     Allergies   Patient has no known allergies.   Review of Systems Review of Systems  HENT:  Positive for rhinorrhea, sinus pressure and sinus pain.   All other systems reviewed and are negative.    Physical Exam Triage Vital Signs ED Triage Vitals  Encounter Vitals Group     BP 02/05/23 1912 136/66  Systolic BP Percentile --      Diastolic BP Percentile --      Pulse Rate 02/05/23 1910 62     Resp 02/05/23 1910 18     Temp 02/05/23 1910 98.1 F (36.7 C)     Temp Source 02/05/23 1910 Oral     SpO2 02/05/23 1910 94 %     Weight --      Height --      Head Circumference --      Peak Flow --      Pain Score 02/05/23 1915 0     Pain Loc --      Pain Education --      Exclude from Growth Chart --    No data found.  Updated Vital Signs BP 136/66   Pulse 62   Temp 98.1 F (36.7 C) (Oral)   Resp 18   SpO2 94%   Visual Acuity Right Eye Distance:   Left Eye Distance:   Bilateral Distance:    Right Eye Near:   Left Eye Near:    Bilateral Near:     Physical Exam Vitals and nursing note reviewed.  Constitutional:      Appearance: He is well-developed.  HENT:     Head: Normocephalic.  Cardiovascular:     Rate and Rhythm: Normal rate.  Pulmonary:     Effort: Pulmonary effort is normal.  Abdominal:     General: There is no distension.  Musculoskeletal:        General: Normal range of motion.     Cervical back: Normal range of  motion.  Skin:    General: Skin is warm.  Neurological:     General: No focal deficit present.     Mental Status: He is alert and oriented to person, place, and time.      UC Treatments / Results  Labs (all labs ordered are listed, but only abnormal results are displayed) Labs Reviewed - No data to display  EKG   Radiology No results found.  Procedures Procedures (including critical care time)  Medications Ordered in UC Medications - No data to display  Initial Impression / Assessment and Plan / UC Course  I have reviewed the triage vital signs and the nursing notes.  Pertinent labs & imaging results that were available during my care of the patient were reviewed by me and considered in my medical decision making (see chart for details).      Final Clinical Impressions(s) / UC Diagnoses   Final diagnoses:  Acute ethmoidal sinusitis, recurrence not specified   Discharge Instructions   None    ED Prescriptions   None    PDMP not reviewed this encounter. An After Visit Summary was printed and given to the patient.       Elson Areas, [place] 02/05/23 1940

## 2023-02-05 NOTE — ED Triage Notes (Signed)
Sore throat, congestion, sinus pain and pressure, fatigue, cough x 2 days. Taking alka selter cold and flu.

## 2023-02-08 DIAGNOSIS — J0140 Acute pansinusitis, unspecified: Secondary | ICD-10-CM | POA: Diagnosis not present

## 2023-06-09 ENCOUNTER — Ambulatory Visit: Payer: BC Managed Care – PPO | Admitting: Dermatology

## 2023-06-22 ENCOUNTER — Ambulatory Visit: Admitting: Dermatology

## 2023-06-22 ENCOUNTER — Encounter: Payer: Self-pay | Admitting: Dermatology

## 2023-06-22 DIAGNOSIS — Z1283 Encounter for screening for malignant neoplasm of skin: Secondary | ICD-10-CM

## 2023-06-22 DIAGNOSIS — I872 Venous insufficiency (chronic) (peripheral): Secondary | ICD-10-CM

## 2023-06-22 DIAGNOSIS — Z85828 Personal history of other malignant neoplasm of skin: Secondary | ICD-10-CM

## 2023-06-22 DIAGNOSIS — L814 Other melanin hyperpigmentation: Secondary | ICD-10-CM

## 2023-06-22 DIAGNOSIS — L820 Inflamed seborrheic keratosis: Secondary | ICD-10-CM

## 2023-06-22 DIAGNOSIS — D1801 Hemangioma of skin and subcutaneous tissue: Secondary | ICD-10-CM

## 2023-06-22 DIAGNOSIS — D229 Melanocytic nevi, unspecified: Secondary | ICD-10-CM

## 2023-06-22 DIAGNOSIS — L821 Other seborrheic keratosis: Secondary | ICD-10-CM

## 2023-06-22 DIAGNOSIS — L578 Other skin changes due to chronic exposure to nonionizing radiation: Secondary | ICD-10-CM

## 2023-06-22 DIAGNOSIS — W908XXA Exposure to other nonionizing radiation, initial encounter: Secondary | ICD-10-CM | POA: Diagnosis not present

## 2023-06-22 NOTE — Patient Instructions (Addendum)

## 2023-06-22 NOTE — Progress Notes (Signed)
 Follow-Up Visit   Subjective  Terry Mceuen. is a 63 y.o. male who presents for the following: Skin Cancer Screening and Full Body Skin Exam hx of BCC, check spots irritating face, arms  The patient presents for Total-Body Skin Exam (TBSE) for skin cancer screening and mole check. The patient has spots, moles and lesions to be evaluated, some may be new or changing and the patient may have concern these could be cancer.  The following portions of the chart were reviewed this encounter and updated as appropriate: medications, allergies, medical history  Review of Systems:  No other skin or systemic complaints except as noted in HPI or Assessment and Plan.  Objective  Well appearing patient in no apparent distress; mood and affect are within normal limits.  A full examination was performed including scalp, head, eyes, ears, nose, lips, neck, chest, axillae, abdomen, back, buttocks, bilateral upper extremities, bilateral lower extremities, hands, feet, fingers, toes, fingernails, and toenails. All findings within normal limits unless otherwise noted below.   Relevant physical exam findings are noted in the Assessment and Plan.  R sideburn x 1, L temple x 1, R antecubital x 1, scalp x 4, back x  15 (22) Stuck on waxy paps with erythema  Assessment & Plan   SKIN CANCER SCREENING PERFORMED TODAY.  ACTINIC DAMAGE - Chronic condition, secondary to cumulative UV/sun exposure - diffuse scaly erythematous macules with underlying dyspigmentation - Recommend daily broad spectrum sunscreen SPF 30+ to sun-exposed areas, reapply every 2 hours as needed.  - Staying in the shade or wearing long sleeves, sun glasses (UVA+UVB protection) and wide brim hats (4-inch brim around the entire circumference of the hat) are also recommended for sun protection.  - Call for new or changing lesions.  LENTIGINES, SEBORRHEIC KERATOSES, HEMANGIOMAS - Benign normal skin lesions - Benign-appearing - Call for any  changes  MELANOCYTIC NEVI - Tan-brown and/or pink-flesh-colored symmetric macules and papules - Benign appearing on exam today - Observation - Call clinic for new or changing moles - Recommend daily use of broad spectrum spf 30+ sunscreen to sun-exposed areas.   HISTORY OF BASAL CELL CARCINOMA OF THE SKIN - No evidence of recurrence today - Recommend regular full body skin exams - Recommend daily broad spectrum sunscreen SPF 30+ to sun-exposed areas, reapply every 2 hours as needed.  - Call if any new or changing lesions are noted between office visits  - L scapula  STASIS DERMATITIS Lower legs Exam: Erythema involving the ankle and distal lower leg with associated lower leg edema. Chronic and persistent condition with duration or expected duration over one year. Condition is symptomatic / bothersome to patient. Not to goal. Stasis in the legs causes chronic leg swelling, which may result in itchy or painful rashes, skin discoloration, skin texture changes, and sometimes ulceration.  Recommend daily graduated compression hose/stockings- easiest to put on first thing in morning, remove at bedtime.  Elevate legs as much as possible. Avoid salt/sodium rich foods. Treatment Plan: No treatment at this time    INFLAMED SEBORRHEIC KERATOSIS (22) R sideburn x 1, L temple x 1, R antecubital x 1, scalp x 4, back x  15 (22) Symptomatic, irritating, patient would like treated. Destruction of lesion - R sideburn x 1, L temple x 1, R antecubital x 1, scalp x 4, back x  15 (22) Complexity: simple   Destruction method: cryotherapy   Informed consent: discussed and consent obtained   Timeout:  patient name, date of  birth, surgical site, and procedure verified Lesion destroyed using liquid nitrogen: Yes   Region frozen until ice ball extended beyond lesion: Yes   Outcome: patient tolerated procedure well with no complications   Post-procedure details: wound care instructions given   Return in  about 1 year (around 06/21/2024) for TBSE, Hx of BCC.  I, Rollie Clipper, RMA, am acting as scribe for Celine Collard, MD .   Documentation: I have reviewed the above documentation for accuracy and completeness, and I agree with the above.  Celine Collard, MD

## 2023-07-02 ENCOUNTER — Ambulatory Visit
Admission: RE | Admit: 2023-07-02 | Discharge: 2023-07-02 | Disposition: A | Discharge status: Home / Self Care | Source: Ambulatory Visit | Attending: Student | Admitting: Student

## 2023-07-02 ENCOUNTER — Other Ambulatory Visit: Payer: Self-pay | Admitting: Student

## 2023-07-02 ENCOUNTER — Ambulatory Visit
Admission: RE | Admit: 2023-07-02 | Discharge: 2023-07-02 | Disposition: A | Discharge status: Home / Self Care | Attending: Student | Admitting: Student

## 2023-07-02 DIAGNOSIS — R059 Cough, unspecified: Secondary | ICD-10-CM

## 2023-10-05 ENCOUNTER — Other Ambulatory Visit: Payer: Self-pay | Admitting: Neurosurgery

## 2023-10-05 DIAGNOSIS — M4712 Other spondylosis with myelopathy, cervical region: Secondary | ICD-10-CM

## 2023-10-18 ENCOUNTER — Ambulatory Visit

## 2024-01-03 ENCOUNTER — Ambulatory Visit

## 2024-03-07 ENCOUNTER — Ambulatory Visit

## 2024-03-07 DIAGNOSIS — R234 Changes in skin texture: Secondary | ICD-10-CM

## 2024-03-07 DIAGNOSIS — L820 Inflamed seborrheic keratosis: Secondary | ICD-10-CM

## 2024-03-07 DIAGNOSIS — L308 Other specified dermatitis: Secondary | ICD-10-CM

## 2024-03-07 DIAGNOSIS — L309 Dermatitis, unspecified: Secondary | ICD-10-CM

## 2024-03-07 DIAGNOSIS — L84 Corns and callosities: Secondary | ICD-10-CM

## 2024-03-07 MED ORDER — CLOBETASOL PROPIONATE 0.05 % EX OINT: TOPICAL_OINTMENT | CUTANEOUS | 1 refills | Status: AC

## 2024-03-07 NOTE — Progress Notes (Signed)
" °  °  Subjective   Terry Horn. is a 64 y.o. male who presents for the following: Lesion(s) of concern . Patient is established patient   Today patient reports: LOC in the right groin that patient noticed last week.   Bumps on hands   Review of Systems:    No other skin or systemic complaints except as noted in HPI or Assessment and Plan.  The following portions of the chart were reviewed this encounter and updated as appropriate: medications, allergies, medical history  Relevant Medical History:  Personal history of non melanoma skin cancer - see medical history for full details   Objective  (SKPE) Well appearing patient in no apparent distress; mood and affect are within normal limits. Examination was performed of the: Focused Exam of: right groin; chaperone present, bilateral hands     Examination notable for: - Stuck-on brown, tan and grey papillated papule on the right groin  Hyperkeratotic papules, plaques of thumbs, knucles  Fissuring at distal fingertips  Examination limited by: Undergarments and Patient deferred removal     Right Groin Stuck on waxy paps with erythema  Assessment & Plan  (SKAP)   Seborrheic keratosis, inflamed - Discussed diagnosis, typical course, and treatment options for this condition - Reassurance, benign, monitor - ABCDE's discussed - Given irritation and symptoms, will proceed with cryotherapy as below  Mild hand dermatitis with fissuring Callus of R palm  Knuckple pad vs callus  - Recommend warm water soaks and pummice stone  - Recommend OTC callus pads to thickened areas  - Start clobetasol  ointment 0.05% twice daily when hands are red and cracking  Discussed side effect of super potent topical steroids including atrophy, dyspigmentation, striae, telangectasia, folliculitis, loss of skin pigment, hair growth, tachyphylaxis, risk of systemic absorption with missuse. - advised can try amlactin to thickened areas     Was sun protection  counseling provided?: No   Level of service outlined above   Patient instructions (SKPI)   Procedures, orders, diagnosis for this visit:  INFLAMED SEBORRHEIC KERATOSIS Right Groin Symptomatic, irritating, patient would like treated. - Destruction of lesion - Right Groin Complexity: simple   Destruction method: cryotherapy   Informed consent: discussed and consent obtained   Timeout:  patient name, date of birth, surgical site, and procedure verified Lesion destroyed using liquid nitrogen: Yes   Region frozen until ice ball extended beyond lesion: Yes   Cryo cycles: 1 or 2. Outcome: patient tolerated procedure well with no complications   Post-procedure details: wound care instructions given     Inflamed seborrheic keratosis -     Destruction of lesion  Other orders -     Clobetasol  Propionate; Apply 1 gram topically to affected area of skin twice daily. Stop once resolved and restart as needed for flares. Avoid use on face, armpits, groin unless otherwise indicated.  Dispense: 60 g; Refill: 1    Return to clinic: No follow-ups on file.  I, Emerick Ege, CMA am acting as scribe for Lauraine JAYSON Kanaris, MD.   Documentation: I have reviewed the above documentation for accuracy and completeness, and I agree with the above.  Lauraine JAYSON Kanaris, MD  "

## 2024-03-07 NOTE — Patient Instructions (Addendum)

## 2024-06-27 ENCOUNTER — Ambulatory Visit: Admitting: Dermatology
